# Patient Record
Sex: Female | Born: 1935 | Race: White | Hispanic: No | Smoking: Never smoker
Health system: Southern US, Community
[De-identification: ages and names within clinical notes are randomized; demographics above are authoritative.]

## PROBLEM LIST (undated history)

## (undated) DIAGNOSIS — E119 Type 2 diabetes mellitus without complications: Secondary | ICD-10-CM

## (undated) DIAGNOSIS — I350 Nonrheumatic aortic (valve) stenosis: Secondary | ICD-10-CM

## (undated) DIAGNOSIS — E785 Hyperlipidemia, unspecified: Secondary | ICD-10-CM

## (undated) DIAGNOSIS — I421 Obstructive hypertrophic cardiomyopathy: Secondary | ICD-10-CM

## (undated) DIAGNOSIS — K219 Gastro-esophageal reflux disease without esophagitis: Secondary | ICD-10-CM

## (undated) DIAGNOSIS — I10 Essential (primary) hypertension: Secondary | ICD-10-CM

## (undated) DIAGNOSIS — Z8619 Personal history of other infectious and parasitic diseases: Secondary | ICD-10-CM

## (undated) DIAGNOSIS — M2550 Pain in unspecified joint: Secondary | ICD-10-CM

## (undated) DIAGNOSIS — I2510 Atherosclerotic heart disease of native coronary artery without angina pectoris: Secondary | ICD-10-CM

## (undated) DIAGNOSIS — I119 Hypertensive heart disease without heart failure: Secondary | ICD-10-CM

## (undated) DIAGNOSIS — Z952 Presence of prosthetic heart valve: Secondary | ICD-10-CM

## (undated) DIAGNOSIS — D649 Anemia, unspecified: Secondary | ICD-10-CM

## (undated) DIAGNOSIS — Z8719 Personal history of other diseases of the digestive system: Secondary | ICD-10-CM

## (undated) DIAGNOSIS — M1990 Unspecified osteoarthritis, unspecified site: Secondary | ICD-10-CM

## (undated) DIAGNOSIS — R351 Nocturia: Secondary | ICD-10-CM

## (undated) DIAGNOSIS — Z8709 Personal history of other diseases of the respiratory system: Secondary | ICD-10-CM

## (undated) HISTORY — PX: ANKLE FRACTURE SURGERY: SHX122

## (undated) HISTORY — PX: CHOLECYSTECTOMY: SHX55

## (undated) HISTORY — DX: Nonrheumatic aortic (valve) stenosis: I35.0

## (undated) HISTORY — DX: Atherosclerotic heart disease of native coronary artery without angina pectoris: I25.10

## (undated) HISTORY — PX: DILATION AND CURETTAGE OF UTERUS: SHX78

## (undated) HISTORY — PX: OTHER SURGICAL HISTORY: SHX169

## (undated) HISTORY — DX: Obstructive hypertrophic cardiomyopathy: I42.1

## (undated) HISTORY — DX: Hypertensive heart disease without heart failure: I11.9

## (undated) HISTORY — DX: Gastro-esophageal reflux disease without esophagitis: K21.9

## (undated) HISTORY — PX: COLONOSCOPY: SHX174

## (undated) HISTORY — PX: WRIST FRACTURE SURGERY: SHX121

---

## 1997-11-07 ENCOUNTER — Ambulatory Visit (HOSPITAL_COMMUNITY): Admission: RE | Admit: 1997-11-07 | Discharge: 1997-11-07 | Payer: Self-pay | Admitting: Family Medicine

## 1998-11-25 ENCOUNTER — Other Ambulatory Visit: Admission: RE | Admit: 1998-11-25 | Discharge: 1998-11-25 | Payer: Self-pay | Admitting: Family Medicine

## 1998-12-11 ENCOUNTER — Encounter: Payer: Self-pay | Admitting: Family Medicine

## 1998-12-11 ENCOUNTER — Ambulatory Visit (HOSPITAL_COMMUNITY): Admission: RE | Admit: 1998-12-11 | Discharge: 1998-12-11 | Payer: Self-pay | Admitting: Family Medicine

## 1999-12-08 ENCOUNTER — Other Ambulatory Visit: Admission: RE | Admit: 1999-12-08 | Discharge: 1999-12-08 | Payer: Self-pay | Admitting: Family Medicine

## 1999-12-14 ENCOUNTER — Encounter: Payer: Self-pay | Admitting: Family Medicine

## 1999-12-14 ENCOUNTER — Encounter: Admission: RE | Admit: 1999-12-14 | Discharge: 1999-12-14 | Payer: Self-pay | Admitting: Family Medicine

## 2000-10-09 ENCOUNTER — Encounter: Payer: Self-pay | Admitting: Obstetrics and Gynecology

## 2000-10-09 ENCOUNTER — Encounter: Admission: RE | Admit: 2000-10-09 | Discharge: 2000-10-09 | Payer: Self-pay | Admitting: Obstetrics and Gynecology

## 2000-10-11 ENCOUNTER — Encounter: Admission: RE | Admit: 2000-10-11 | Discharge: 2000-10-11 | Payer: Self-pay | Admitting: Obstetrics and Gynecology

## 2000-10-11 ENCOUNTER — Encounter: Payer: Self-pay | Admitting: Obstetrics and Gynecology

## 2000-11-10 ENCOUNTER — Ambulatory Visit (HOSPITAL_BASED_OUTPATIENT_CLINIC_OR_DEPARTMENT_OTHER): Admission: RE | Admit: 2000-11-10 | Discharge: 2000-11-10 | Payer: Self-pay | Admitting: Orthopedic Surgery

## 2000-11-27 ENCOUNTER — Ambulatory Visit (HOSPITAL_COMMUNITY): Admission: RE | Admit: 2000-11-27 | Discharge: 2000-11-27 | Payer: Self-pay | Admitting: Obstetrics and Gynecology

## 2000-11-27 ENCOUNTER — Encounter (INDEPENDENT_AMBULATORY_CARE_PROVIDER_SITE_OTHER): Payer: Self-pay | Admitting: Specialist

## 2000-12-19 ENCOUNTER — Encounter: Admission: RE | Admit: 2000-12-19 | Discharge: 2000-12-19 | Payer: Self-pay | Admitting: Family Medicine

## 2000-12-19 ENCOUNTER — Encounter: Payer: Self-pay | Admitting: Family Medicine

## 2001-11-28 ENCOUNTER — Other Ambulatory Visit: Admission: RE | Admit: 2001-11-28 | Discharge: 2001-11-28 | Payer: Self-pay | Admitting: Obstetrics and Gynecology

## 2002-01-15 ENCOUNTER — Encounter: Payer: Self-pay | Admitting: Family Medicine

## 2002-01-15 ENCOUNTER — Encounter: Admission: RE | Admit: 2002-01-15 | Discharge: 2002-01-15 | Payer: Self-pay | Admitting: Family Medicine

## 2003-02-06 ENCOUNTER — Encounter: Admission: RE | Admit: 2003-02-06 | Discharge: 2003-02-06 | Payer: Self-pay | Admitting: Family Medicine

## 2003-02-06 ENCOUNTER — Encounter: Payer: Self-pay | Admitting: Family Medicine

## 2003-11-05 ENCOUNTER — Encounter: Payer: Self-pay | Admitting: Cardiology

## 2003-11-05 ENCOUNTER — Ambulatory Visit (HOSPITAL_COMMUNITY): Admission: RE | Admit: 2003-11-05 | Discharge: 2003-11-05 | Payer: Self-pay | Admitting: Family Medicine

## 2004-03-26 ENCOUNTER — Encounter: Admission: RE | Admit: 2004-03-26 | Discharge: 2004-03-26 | Payer: Self-pay | Admitting: Family Medicine

## 2004-05-10 ENCOUNTER — Other Ambulatory Visit: Admission: RE | Admit: 2004-05-10 | Discharge: 2004-05-10 | Payer: Self-pay | Admitting: Family Medicine

## 2004-05-18 ENCOUNTER — Encounter: Admission: RE | Admit: 2004-05-18 | Discharge: 2004-05-18 | Payer: Self-pay | Admitting: Family Medicine

## 2005-06-08 ENCOUNTER — Encounter: Admission: RE | Admit: 2005-06-08 | Discharge: 2005-06-08 | Payer: Self-pay | Admitting: Family Medicine

## 2006-11-24 ENCOUNTER — Encounter: Admission: RE | Admit: 2006-11-24 | Discharge: 2006-11-24 | Payer: Self-pay | Admitting: Cardiology

## 2010-09-10 NOTE — H&P (Signed)
[facility]  Patient:    Lori Gilbert, Lori Gilbert                        MRN: [id number] Adm. Date:  11/27/00 Attending:  Beather Arbour. Thomasena Edis, M.D. CC:         Maricela Bo, M.D., [facility], [place]   History and Physical  HISTORY OF PRESENT ILLNESS:   The patient is a 77 year old gravida 3, para 2, Caucasian female who experienced postmenopausal bleeding in March, referred by Dr. Maricela Bo.  She discontinued her Prempro on August 06, 2000 and stopped bleeding immediately; she did, however, have postmenopausal clotting last August.  She had been taking Prempro 0.625/5 mg.  She was referred for evaluation of this postmenopausal bleeding.  Patient did see ______, another gynecologist, in 1999 and had an endometrial biopsy due to postmenopausal bleeding at that time.  In August of 2001, the patient had an ultrasound at [facility] which reveals an ovarian cyst but was otherwise normal.  Patient did undergo a sonohysterogram for her postmenopausal bleeding.  She was found to have a large fundal polyp at the time of sonohysterogram and is thus admitted for a D&C/hysteroscopy to remove the polyp and to obtain endometrial tissue due to postmenopausal bleeding.  PAST OBSTETRICAL/GYNECOLOGICAL HISTORY:  Menarche at age 77.  No history of STD or PID.  No abnormal Pap.  Patient became menopausal at age 63.  ALLERGIES:                    No known drug allergies.  CURRENT MEDICATIONS:          1. Cozaar 100 mg daily.                               2. Pravachol 40 mg daily.                               3. Prilosec p.r.n.                               4. Aspirin one daily.                               5. Calcium 1800 mg.  PAST MEDICAL HISTORY:         1. Hypertension.                               2. Gastroesophageal reflux disease.                               3. Diabetes mellitus, diet controlled.  PAST SURGICAL HISTORY:        1. Removal of a cataract.           2. Fracture of the left ankle in 1994.  FAMILY HISTORY:               There is no family history of colon, breast, ovarian or prostate cancer.  The patients mother died at 95 of old age.  Her father died at 18 of Hodgkins disease.  She has one sister, age 93, with  hypertension.  Two additional sisters, ages 51 and 73, are alive and well. One brother is 47 with hypertension.  Two brothers are ages 51 and 65, alive and well.  She has two children, a daughter, age 19, and a son, age 43, both alive and well.  SOCIAL HISTORY:               The patient is a retired Probation officer.  She denies smoking or alcohol use.  REVIEW OF SYSTEMS:            Noncontributory.  Patient denies headaches, visual changes, chest pain, shortness of breath, abdominal pain, change in bowel habits, focal neurological symptoms, unintentional weight loss, dysuria, urgency, frequency, vaginal pruritus or discharge or any pain or bleeding with intercourse.  PHYSICAL EXAMINATION:  GENERAL APPEARANCE:           Patient is a well-developed Caucasian female.  VITAL SIGNS:                  Blood pressure 180/106.  Heart rate 76.  Weight 194 pounds.  HEENT:                        Normal.  NECK:                         Supple without thyromegaly or adenopathy.  LUNGS:                        Clear to auscultation.  CARDIAC:                      Regular rate and rhythm without murmurs, rubs, or gallops.  BREASTS:                      Symmetrical without masses, skin retraction or nipple discharge.  ABDOMEN:                      Soft, nontender, with normal bowel sounds.  No organomegaly or masses.  PELVIC:                       External genitalia normal.  No vulvar, vaginal or cervical lesions.  Pap smear had been previously performed by Dr. Purnell Shoemaker less than a year ago and was reportedly normal.  Bimanual examination reveals the uterus to be normal without any adnexal mass or tenderness.  EXTREMITIES:                   No cyanosis, clubbing or edema.  NEUROLOGIC:                   Oriented x 3.  IMPRESSION AND PLAN:          Patient is a 77 year old gravida 3, para 2 Caucasian female with postmenopausal bleeding and endometrial polyp found on sonohysterogram.  Patient is admitted for a dilatation and curettage and hysteroscopy to remove the endometrial polyp and to perform endometrial sampling to rule out endometrial hyperplasia or carcinoma.  Risks of surgery including anesthetic complication, hemorrhage or infection, damage to adjacent structures including bladder or bowel, blood vessels or ureters were discussed with the patient.  She was made aware of the risk of uterine perforation which could result in overwhelming life-threatening hemorrhage requiring emergent hysterectomy or uterine perforation which could result in bowel damage requiring  emergent colostomy or which could result in overwhelming life-threatening peritonitis.  She expresses understanding of and acceptance of these risks and desires to proceed. DD:  11/27/00 TD:  11/27/00 Job: [id number]

## 2010-09-10 NOTE — Op Note (Signed)
[facility]  Patient:    Lori Gilbert, Lori Gilbert                      MRN: [id number] Proc. Date: 11/27/00 Adm. Date:  [id number] Attending:  Madelyn Flavors CC:         Maricela Bo, M.D., [facility], [place]   Operative Report  PREOPERATIVE DIAGNOSES:       1. Postmenopausal bleeding.                               2. Endometrial polyp on sonohistogram.  POSTOPERATIVE DIAGNOSES:      1. Postmenopausal bleeding.                               2. Endometrial polyp on sonohistogram.  OPERATION:                    1. Dilatation and curettage.                               2. Hysteroscopy.                               3. Polypectomy.  SURGEON:                      Beather Arbour. Thomasena Edis, M.D., Ph.D.  FLUIDS:                       500 cc crystalloid during the procedure but 1000 cc prior to that time.  ESTIMATED BLOOD LOSS:         Minimal.  COMPLICATIONS:                None.  ANESTHESIA:                   General.  DRAINS:                       None.  DESCRIPTION OF PROCEDURE:     The patient entered the preop area.  We check blood sugar because of her history of diet-controlled diabetes.  Blood sugar was 321.  In addition, the patient gives a history today of a possible loss of consciousness event in July when she fell and broke her arm.  She thinks she just turned her ankle and does not remember this; however, she did have sedation and an axillary nerve block to repair her fractured arm just after this event without any complications.  We did receive EKG from the day surgery area at [facility], but it contained significant artifact; thus another EKG was performed this morning and was noted to be normal.  In addition, the patients blood pressure was elevated today.  We will let Dr. Purnell Shoemaker know of these findings.  The patient was brought to the operating room after it was deemed that she was fine for surgery and identified on the  operating table.  After induction of adequate general anesthesia, the patient was placed in the [place] stirrups and prepped and draped in the usual sterile fashion.  The bladder was straight catheterized for approximately 100 cc of clear, yellow urine.  Examination  under anesthesia revealed the uterus to be approximately 8 weeks, anteverted, without any adnexal mass palpated.  The speculum was placed, and the anterior lip of the cervix was infiltrated with 1 cc of 1% lidocaine and grasped with a single-tooth tenaculum.  The remaining 9 cc of 1% plain lidocaine were infused for a paracervical block.  The cervix was very gently dilated up to a #27 Pratt dilator using the [facility] hysteroscope.  The hysteroscope was placed, and a large fundal polyp was identified.  The tubal ostium on the right was identified.  Endometrium was otherwise thin.  The polyp was then removed in its entirety with Randall Stone forceps and also using the small serrated curet.  A very careful curettage was performed.  After removal of the polyp, there was a good cry heard all around.  The polyp and endometrial curettings were sent to pathology for examination.  There was noted to be no bleeding from the cervix.  At that point, the procedure was then terminated.  The patient tolerated the procedure well without apparent complications and was transferred to the recovery room in stable condition.  All instrument, sponge, and needle counts were correct. DD:  11/27/00 TD:  11/28/00 Job: [id number]

## 2010-09-10 NOTE — Op Note (Signed)
[place]  Patient:    BUENA, BOEHM                      MRN: [id number] Proc. Date: 11/10/00 Adm. Date:  [id number] Attending:  Ronne Binning                           Operative Report  PREOPERATIVE DIAGNOSES:  Distal radius fracture left wrist; scaphoid fracture, left scaphoid.  POSTOPERATIVE DIAGNOSES:  Distal radius fracture left wrist; scaphoid fracture, left scaphoid.  OPERATION:  Open reduction and internal fixation with DBI plate, volar left wrist; Acutrak screw fixation, left scaphoid.  SURGEON:  Nicki Reaper, M.D.  ASSISTANT:  Artist Pais. Mina Marble, M.D.  ANESTHESIA:  Axillary block.  ANESTHESIOLOGIST:  Janetta Hora. Gelene Mink, M.D.  HISTORY:  The patient is a 77 year old female who suffered a fall and a distal radius fracture which is displaced and has not been held with closed means.  DESCRIPTION OF PROCEDURE:  The patient was brought to the operating room where an axillary block was carried out without difficulty.  She was prepped and draped using Betadine scrubbing solution with the left arm free.  The limb was exsanguinated with an Esmarch bandage.  Tourniquet placed high on the arm was inflated to 250 mmHg.  A volar incision was made over the flexor carpi radialis, carried radially and then ulnarly at the wrist crease and carried down through subcutaneous tissue.  Bleeders were electrocauterized.  Interval between the flexor carpi radialis and radial nerve was identified.  The dissection was carried down to the pronator quadratus.  This was incised from its radial attachment.  Periosteal elevators were used to elevate the periosteum.  The fracture was immediately encountered.  This showed significant scarring and shortening.  This was freed both dorsally and palmarly.  The fracture was then debrided, manipulated and reduced.  A DBI volar left plate was then selected.  This was placed onto the distal radius, holding  the fracture in a reduced position.  A 12-mm screw was placed in the adjustable hole; this allowed positioning of the plate under image intensification.  This was tigthened.  A distal screw was then inserted; this measured 20 mm.  This was placed and holding the distal fragment in good position with good alignment in both AP and lateral directions.  The remaining three distal screws were inserted; these were placed as screws rather than pegs and measured 20 to 22 mm.  The proximal holes were then drilled and placed with 12- to 8-mm screws, firmly fixing the plate in position; this was confirmed on both AP lateral directions.  The dissection was then carried down to the distal pole of the scaphoid.  This was isolated.  At the scapho-trapezial joint, a 0.035 K-wire was then inserted, found to lie in good position but slightly volar, for placement of an Acutrak screw.  A second mini-Acutrak screw guidepin was then placed slightly more dorsally by approximately 2 to 3 mm.  This was found to lie in good position.  This was measured at 25 mm.  This was then tapped to 20 mm and a 20-mm screw was then inserted across the fracture site under image intensification.  Multiple views were taken to be certain that the screw had not entered into the joint; this was confirmed on x-rays.  There was good compression.  The rotation pin was removed along with the guidewire.  The wounds were copiously irrigated with a saline solution.  The pronator quadratus was then reapproximated to its normal position with figure-of-eight 4-0 Vicryl sutures, the subcutaneous tissue closed with interrupted 4-0 Vicryl and the skin with a subcuticular 4-0 Monocryl suture.  Steri-Strips were applied and sterile compressive dressing and dorsal palmar splint applied.  The patient tolerated the procedure well and was taken to the recovery room for observation in satisfactory condition.  Radial nerve was protected throughout the  procedure. On deflation of the tourniquet, all fingers immediately pinked.  Patient is discharged home to return to the [facility] in one week, on Percocet and Keflex. DD:  11/10/00 TD:  11/11/00 Job: [id number]

## 2011-12-10 ENCOUNTER — Other Ambulatory Visit: Payer: Self-pay | Admitting: Cardiology

## 2013-05-07 ENCOUNTER — Encounter (HOSPITAL_BASED_OUTPATIENT_CLINIC_OR_DEPARTMENT_OTHER): Payer: Self-pay | Admitting: *Deleted

## 2013-05-13 ENCOUNTER — Ambulatory Visit (HOSPITAL_BASED_OUTPATIENT_CLINIC_OR_DEPARTMENT_OTHER)
Admission: RE | Admit: 2013-05-13 | Discharge: 2013-05-13 | Disposition: A | Discharge status: Home / Self Care | Payer: Medicare Other | Source: Ambulatory Visit | Attending: Orthopedic Surgery | Admitting: Orthopedic Surgery

## 2013-05-13 ENCOUNTER — Encounter (HOSPITAL_BASED_OUTPATIENT_CLINIC_OR_DEPARTMENT_OTHER)
Admission: RE | Disposition: A | Discharge status: Home / Self Care | Payer: Self-pay | Source: Ambulatory Visit | Attending: Orthopedic Surgery

## 2013-05-13 ENCOUNTER — Encounter (HOSPITAL_BASED_OUTPATIENT_CLINIC_OR_DEPARTMENT_OTHER): Payer: Self-pay

## 2013-05-13 ENCOUNTER — Encounter (HOSPITAL_BASED_OUTPATIENT_CLINIC_OR_DEPARTMENT_OTHER): Payer: Medicare Other | Admitting: Anesthesiology

## 2013-05-13 ENCOUNTER — Ambulatory Visit (HOSPITAL_BASED_OUTPATIENT_CLINIC_OR_DEPARTMENT_OTHER): Payer: Medicare Other | Admitting: Anesthesiology

## 2013-05-13 ENCOUNTER — Other Ambulatory Visit: Payer: Self-pay | Admitting: Physician Assistant

## 2013-05-13 DIAGNOSIS — I10 Essential (primary) hypertension: Secondary | ICD-10-CM | POA: Insufficient documentation

## 2013-05-13 DIAGNOSIS — F329 Major depressive disorder, single episode, unspecified: Secondary | ICD-10-CM | POA: Insufficient documentation

## 2013-05-13 DIAGNOSIS — F3289 Other specified depressive episodes: Secondary | ICD-10-CM | POA: Insufficient documentation

## 2013-05-13 DIAGNOSIS — G5600 Carpal tunnel syndrome, unspecified upper limb: Secondary | ICD-10-CM | POA: Insufficient documentation

## 2013-05-13 DIAGNOSIS — Z794 Long term (current) use of insulin: Secondary | ICD-10-CM | POA: Insufficient documentation

## 2013-05-13 DIAGNOSIS — F411 Generalized anxiety disorder: Secondary | ICD-10-CM | POA: Insufficient documentation

## 2013-05-13 DIAGNOSIS — E119 Type 2 diabetes mellitus without complications: Secondary | ICD-10-CM | POA: Insufficient documentation

## 2013-05-13 HISTORY — PX: CARPAL TUNNEL RELEASE: SHX101

## 2013-05-13 HISTORY — DX: Personal history of other diseases of the digestive system: Z87.19

## 2013-05-13 HISTORY — DX: Type 2 diabetes mellitus without complications: E11.9

## 2013-05-13 HISTORY — DX: Essential (primary) hypertension: I10

## 2013-05-13 LAB — GLUCOSE, CAPILLARY: Glucose-Capillary: 109 mg/dL — ABNORMAL HIGH (ref 70–99)

## 2013-05-13 SURGERY — CARPAL TUNNEL RELEASE
Anesthesia: Regional | Site: Wrist | Laterality: Right

## 2013-05-13 MED ORDER — LACTATED RINGERS IV SOLN
INTRAVENOUS | Status: DC
  Administered 2013-05-13: 11:00:00 via INTRAVENOUS

## 2013-05-13 MED ORDER — BUPIVACAINE HCL (PF) 0.5 % IJ SOLN
INTRAMUSCULAR | Status: AC
  Filled 2013-05-13: qty 30

## 2013-05-13 MED ORDER — FENTANYL CITRATE 0.05 MG/ML IJ SOLN
INTRAMUSCULAR | Status: DC | PRN
  Administered 2013-05-13 (×2): 50 ug via INTRAVENOUS

## 2013-05-13 MED ORDER — ONDANSETRON HCL 4 MG/2ML IJ SOLN
INTRAMUSCULAR | Status: DC | PRN
  Administered 2013-05-13: 4 mg via INTRAVENOUS

## 2013-05-13 MED ORDER — PROPOFOL 10 MG/ML IV BOLUS
INTRAVENOUS | Status: DC | PRN
  Administered 2013-05-13: 10 mg via INTRAVENOUS
  Administered 2013-05-13: 20 mg via INTRAVENOUS

## 2013-05-13 MED ORDER — CEFAZOLIN SODIUM-DEXTROSE 2-3 GM-% IV SOLR
INTRAVENOUS | Status: AC
  Filled 2013-05-13: qty 50

## 2013-05-13 MED ORDER — BUPIVACAINE HCL (PF) 0.5 % IJ SOLN
INTRAMUSCULAR | Status: DC | PRN
  Administered 2013-05-13: 10 mL

## 2013-05-13 MED ORDER — SODIUM CHLORIDE 0.9 % IV SOLN: INTRAVENOUS | Status: DC

## 2013-05-13 MED ORDER — CEFAZOLIN SODIUM-DEXTROSE 2-3 GM-% IV SOLR
2.0000 g | INTRAVENOUS | Status: AC
  Administered 2013-05-13: 2 g via INTRAVENOUS

## 2013-05-13 MED ORDER — FENTANYL CITRATE 0.05 MG/ML IJ SOLN
INTRAMUSCULAR | Status: AC
  Filled 2013-05-13: qty 4

## 2013-05-13 MED ORDER — LIDOCAINE HCL (CARDIAC) 20 MG/ML IV SOLN
INTRAVENOUS | Status: DC | PRN
  Administered 2013-05-13: 50 mg via INTRAVENOUS

## 2013-05-13 MED ORDER — FENTANYL CITRATE 0.05 MG/ML IJ SOLN: 25.0000 ug | INTRAMUSCULAR | Status: DC | PRN

## 2013-05-13 MED ORDER — MIDAZOLAM HCL 2 MG/2ML IJ SOLN: 1.0000 mg | INTRAMUSCULAR | Status: DC | PRN

## 2013-05-13 MED ORDER — FENTANYL CITRATE 0.05 MG/ML IJ SOLN: 50.0000 ug | INTRAMUSCULAR | Status: DC | PRN

## 2013-05-13 MED ORDER — DEXAMETHASONE SODIUM PHOSPHATE 10 MG/ML IJ SOLN
INTRAMUSCULAR | Status: DC | PRN
  Administered 2013-05-13: 5 mg via INTRAVENOUS

## 2013-05-13 MED ORDER — LIDOCAINE HCL (PF) 1 % IJ SOLN
INTRAMUSCULAR | Status: AC
  Filled 2013-05-13: qty 5

## 2013-05-13 MED ORDER — CHLORHEXIDINE GLUCONATE 4 % EX LIQD: 60.0000 mL | Freq: Once | CUTANEOUS | Status: DC

## 2013-05-13 MED ORDER — ONDANSETRON HCL 4 MG/2ML IJ SOLN: 4.0000 mg | Freq: Once | INTRAMUSCULAR | Status: DC | PRN

## 2013-05-13 SURGICAL SUPPLY — 41 items
BANDAGE ELASTIC 3 VELCRO ST LF (GAUZE/BANDAGES/DRESSINGS) ×2 IMPLANT
BLADE MINI RND TIP GREEN BEAV (BLADE) IMPLANT
BLADE SURG 15 STRL LF DISP TIS (BLADE) ×1 IMPLANT
BLADE SURG 15 STRL SS (BLADE) ×1
BNDG CONFORM 3 STRL LF (GAUZE/BANDAGES/DRESSINGS) ×2 IMPLANT
BNDG ESMARK 4X9 LF (GAUZE/BANDAGES/DRESSINGS) ×2 IMPLANT
CORDS BIPOLAR (ELECTRODE) ×2 IMPLANT
COVER MAYO STAND STRL (DRAPES) ×2 IMPLANT
COVER TABLE BACK 60X90 (DRAPES) ×2 IMPLANT
DRAPE EXTREMITY T 121X128X90 (DRAPE) ×2 IMPLANT
DRSG EMULSION OIL 3X3 NADH (GAUZE/BANDAGES/DRESSINGS) ×2 IMPLANT
DURAPREP 26ML APPLICATOR (WOUND CARE) ×2 IMPLANT
GLOVE BIOGEL PI IND STRL 7.0 (GLOVE) ×1 IMPLANT
GLOVE BIOGEL PI IND STRL 8 (GLOVE) ×1 IMPLANT
GLOVE BIOGEL PI INDICATOR 7.0 (GLOVE) ×1
GLOVE BIOGEL PI INDICATOR 8 (GLOVE) ×1
GLOVE ECLIPSE 6.5 STRL STRAW (GLOVE) ×2 IMPLANT
GLOVE SURG ORTHO 8.0 STRL STRW (GLOVE) ×2 IMPLANT
GOWN STRL REUS W/ TWL LRG LVL3 (GOWN DISPOSABLE) ×1 IMPLANT
GOWN STRL REUS W/ TWL XL LVL3 (GOWN DISPOSABLE) ×1 IMPLANT
GOWN STRL REUS W/TWL LRG LVL3 (GOWN DISPOSABLE) ×1
GOWN STRL REUS W/TWL XL LVL3 (GOWN DISPOSABLE) ×1
NEEDLE HYPO 25X1 1.5 SAFETY (NEEDLE) ×2 IMPLANT
NS IRRIG 1000ML POUR BTL (IV SOLUTION) ×2 IMPLANT
PACK BASIN DAY SURGERY FS (CUSTOM PROCEDURE TRAY) ×2 IMPLANT
PAD ABD 8X10 STRL (GAUZE/BANDAGES/DRESSINGS) ×2 IMPLANT
PADDING CAST ABS 3INX4YD NS (CAST SUPPLIES) ×1
PADDING CAST ABS 4INX4YD NS (CAST SUPPLIES)
PADDING CAST ABS COTTON 3X4 (CAST SUPPLIES) ×1 IMPLANT
PADDING CAST ABS COTTON 4X4 ST (CAST SUPPLIES) IMPLANT
SPLINT PLASTER CAST XFAST 3X15 (CAST SUPPLIES) ×1 IMPLANT
SPLINT PLASTER XTRA FASTSET 3X (CAST SUPPLIES) ×1
SPONGE GAUZE 4X4 12PLY (GAUZE/BANDAGES/DRESSINGS) ×2 IMPLANT
STOCKINETTE 4X48 STRL (DRAPES) ×2 IMPLANT
SUT ETHILON 4 0 PS 2 18 (SUTURE) ×2 IMPLANT
SUT ETHILON 5 0 P 3 18 (SUTURE)
SUT NYLON ETHILON 5-0 P-3 1X18 (SUTURE) IMPLANT
SYR BULB 3OZ (MISCELLANEOUS) ×2 IMPLANT
SYR CONTROL 10ML LL (SYRINGE) IMPLANT
TOWEL OR 17X24 6PK STRL BLUE (TOWEL DISPOSABLE) ×2 IMPLANT
UNDERPAD 30X30 INCONTINENT (UNDERPADS AND DIAPERS) ×2 IMPLANT

## 2013-05-13 NOTE — Brief Op Note (Signed)
05/13/2013  12:45 PM  PATIENT:  Lori Gilbert  80 y.o. female  PRE-OPERATIVE DIAGNOSIS:  RIGHT CARPAL TUNNEL SYNDROME   POST-OPERATIVE DIAGNOSIS:  RIGHT CARPAL TUNNEL SYNDROME   PROCEDURE:  Procedure(s): RIGHT CARPAL TUNNEL RELEASE (Right)  SURGEON:  Surgeon(s) and Role:    * W D Carloyn Manner., MD - Primary  PHYSICIAN ASSISTANT:   ASSISTANTS: none   ANESTHESIA:   general, local  EBL:  Total I/O In: 700 [I.V.:700] Out: -   BLOOD ADMINISTERED:none  DRAINS: none   LOCAL MEDICATIONS USED:  MARCAINE     SPECIMEN:  No Specimen  DISPOSITION OF SPECIMEN:  N/A  COUNTS:  YES  TOURNIQUET:   Total Tourniquet Time Documented: Forearm (Right) - 23 minutes Total: Forearm (Right) - 23 minutes   DICTATION: .Other Dictation: Dictation Number unknown  PLAN OF CARE: Discharge to home after PACU  PATIENT DISPOSITION:  PACU - hemodynamically stable.   Delay start of Pharmacological VTE agent (>24hrs) due to surgical blood loss or risk of bleeding: not applicable

## 2013-05-13 NOTE — Discharge Instructions (Signed)
Diet: As you were doing prior to hospitalization  Activity:  Increase activity slowly as tolerated                  No lifting or driving while on pain med   Shower:  May shower without a dressing starting Thursday, NO SOAKING IN TUB, then cover with clean dry dressing.  Dressing:  You may change your dressing on Thursday                    Then change the dressing daily with sterile 4"x4"s gauze dressing                     Weight Bearing:  No lifting with operative hand, but continue to work on range of motion of fingers to help reduce swelling.  To prevent constipation: you may use a stool softener such as -               Colace ( over the counter) 100 mg by mouth twice a day                Drink plenty of fluids ( prune juice may be helpful) and high fiber foods                Miralax ( over the counter) for constipation as needed.    Precautions:  If you experience chest pain or shortness of breath - call 911 immediately               For transfer to the hospital emergency department!!               If you develop a fever greater that 101 F, purulent drainage from wound,                             increased redness or drainage from wound, or calf pain -- Call the office   Follow- Up Appointment:  Please call for an appointment to be seen in 2 weeks               [place] - [phone]    Post Anesthesia Home Care Instructions  Activity: Get plenty of rest for the remainder of the day. A responsible adult should stay with you for 24 hours following the procedure.  For the next 24 hours, DO NOT: -Drive a car -Advertising copywriter -Drink alcoholic beverages -Take any medication unless instructed by your physician -Make any legal decisions or sign important papers.  Meals: Start with liquid foods such as gelatin or soup. Progress to regular foods as tolerated. Avoid greasy, spicy, heavy foods. If nausea and/or vomiting occur, drink only clear liquids until the nausea and/or  vomiting subsides. Call your physician if vomiting continues.  Special Instructions/Symptoms: Your throat may feel dry or sore from the anesthesia or the breathing tube placed in your throat during surgery. If this causes discomfort, gargle with warm salt water. The discomfort should disappear within 24 hours.

## 2013-05-13 NOTE — Anesthesia Postprocedure Evaluation (Signed)
  Anesthesia Post-op Note  Patient: Lori Gilbert  Procedure(s) Performed: Procedure(s): RIGHT CARPAL TUNNEL RELEASE (Right)  Patient Location: PACU  Anesthesia Type:MAC and Bier block  Level of Consciousness: awake, alert  and oriented  Airway and Oxygen Therapy: Patient Spontanous Breathing  Post-op Pain: mild  Post-op Assessment: Post-op Vital signs reviewed  Post-op Vital Signs: Reviewed  Complications: No apparent anesthesia complications

## 2013-05-13 NOTE — Interval H&P Note (Signed)
History and Physical Interval Note:  05/13/2013 12:01 PM  Lori Gilbert  has presented today for surgery, with the diagnosis of RIGHT CARPAL TUNNEL SYNDROME   The various methods of treatment have been discussed with the patient and family. After consideration of risks, benefits and other options for treatment, the patient has consented to  Procedure(s): RIGHT CARPAL TUNNEL RELEASE (Right) as a surgical intervention .  The patient's history has been reviewed, patient examined, no change in status, stable for surgery.  I have reviewed the patient's chart and labs.  Questions were answered to the patient's satisfaction.     Aydon Swamy JR,W D

## 2013-05-13 NOTE — H&P (View-Only) (Signed)
Lori Gilbert is an 80 y.o. female.   Chief Complaint: right carpal tunnel syndrom  HPI: Patient has been seen in our outpatient clinic nerve conduction studies showing right median nerve compression consistent with carpal tunnel syndrome.  Likely exacerbated by distal radius fracture she complains of profound numbness and pain in her hand.    Past Medical History  Diagnosis Date  . Diabetes mellitus without complication   . Hypertension   . H/O hiatal hernia   . Anxiety   . Depression     Past Surgical History  Procedure Laterality Date  . Cholecystectomy    . Dilation and curettage of uterus    . Ankle fracture surgery Bilateral   . Wrist fracture surgery Left     No family history on file. Social History:  has no tobacco, alcohol, and drug history on file.  Allergies:  Allergies  Allergen Reactions  . Codeine     hyper     (Not in a hospital admission)  No results found for this or any previous visit (from the past 48 hour(s)). No results found.  Review of Systems  Constitutional: Negative.   HENT: Negative.   Eyes: Negative.   Respiratory: Negative.   Cardiovascular: Negative.   Gastrointestinal: Negative.   Genitourinary: Positive for dysuria. Negative for urgency, frequency, hematuria and flank pain.  Musculoskeletal:       Right hand pain and numbness  Skin: Negative.   Neurological: Negative.   Endo/Heme/Allergies: Bruises/bleeds easily.  Psychiatric/Behavioral: Positive for depression. The patient is nervous/anxious.     There were no vitals taken for this visit. Physical Exam  Constitutional: She is oriented to person, place, and time. She appears well-developed and well-nourished. No distress.  HENT:  Head: Normocephalic and atraumatic.  Nose: Nose normal.  Eyes: Conjunctivae and EOM are normal.  Neck: Normal range of motion. Neck supple.  Cardiovascular: Normal rate, regular rhythm and intact distal pulses.   Respiratory: Effort normal. No  respiratory distress.  GI: Soft. She exhibits no distension. There is no tenderness.  Musculoskeletal:       Right wrist: She exhibits decreased range of motion, tenderness and bony tenderness.  Right hand slightly diminished grip strength, positive Tinel, Positive Phalens   Lymphadenopathy:    She has no cervical adenopathy.  Neurological: She is alert and oriented to person, place, and time. No cranial nerve deficit.  Skin: Skin is warm and dry.  Psychiatric: She has a normal mood and affect. Her behavior is normal.     Assessment/Plan Right carpal tunnel syndrome  Risks and benefits of right carpal tunnel release discussed with the patient and she wishes to proceed.  This will be done on an outpatient basis d/c home following surgery.    Lori Gilbert 05/13/2013, 10:11 AM    

## 2013-05-13 NOTE — Anesthesia Preprocedure Evaluation (Addendum)
Anesthesia Evaluation  Patient identified by MRN, date of birth, ID band Patient awake    Reviewed: Allergy & Precautions, H&P , NPO status , Patient's Chart, lab work & pertinent test results  Airway Mallampati: I TM Distance: >3 FB Neck ROM: Full    Dental  (+) Teeth Intact and Dental Advisory Given   Pulmonary  breath sounds clear to auscultation        Cardiovascular hypertension, + Valvular Problems/Murmurs AS Rhythm:Regular Rate:Normal     Neuro/Psych    GI/Hepatic   Endo/Other  diabetes, Well Controlled, Type 2, Insulin Dependent and Oral Hypoglycemic Agents  Renal/GU      Musculoskeletal   Abdominal   Peds  Hematology   Anesthesia Other Findings   Reproductive/Obstetrics                          Anesthesia Physical Anesthesia Plan  ASA: III  Anesthesia Plan: Bier Block   Post-op Pain Management:    Induction: Intravenous  Airway Management Planned: Simple Face Mask  Additional Equipment:   Intra-op Plan:   Post-operative Plan:   Informed Consent: I have reviewed the patients History and Physical, chart, labs and discussed the procedure including the risks, benefits and alternatives for the proposed anesthesia with the patient or authorized representative who has indicated his/her understanding and acceptance.   Dental advisory given  Plan Discussed with: CRNA, Anesthesiologist and Surgeon  Anesthesia Plan Comments:         Anesthesia Quick Evaluation

## 2013-05-13 NOTE — Transfer of Care (Signed)
Immediate Anesthesia Transfer of Care Note  Patient: Lori Gilbert  Procedure(s) Performed: Procedure(s): RIGHT CARPAL TUNNEL RELEASE (Right)  Patient Location: PACU  Anesthesia Type:MAC and Bier block  Level of Consciousness: awake and alert   Airway & Oxygen Therapy: Patient Spontanous Breathing and Patient connected to face mask oxygen  Post-op Assessment: Report given to PACU RN and Post -op Vital signs reviewed and stable  Post vital signs: Reviewed and stable  Complications: No apparent anesthesia complications

## 2013-05-13 NOTE — H&P (Signed)
Lori Gilbert is an 80 y.o. female.   Chief Complaint: right carpal tunnel syndrom  HPI: Patient has been seen in our outpatient clinic nerve conduction studies showing right median nerve compression consistent with carpal tunnel syndrome.  Likely exacerbated by distal radius fracture she complains of profound numbness and pain in her hand.    Past Medical History  Diagnosis Date  . Diabetes mellitus without complication   . Hypertension   . H/O hiatal hernia   . Anxiety   . Depression     Past Surgical History  Procedure Laterality Date  . Cholecystectomy    . Dilation and curettage of uterus    . Ankle fracture surgery Bilateral   . Wrist fracture surgery Left     No family history on file. Social History:  has no tobacco, alcohol, and drug history on file.  Allergies:  Allergies  Allergen Reactions  . Codeine     hyper     (Not in a hospital admission)  No results found for this or any previous visit (from the past 48 hour(s)). No results found.  Review of Systems  Constitutional: Negative.   HENT: Negative.   Eyes: Negative.   Respiratory: Negative.   Cardiovascular: Negative.   Gastrointestinal: Negative.   Genitourinary: Positive for dysuria. Negative for urgency, frequency, hematuria and flank pain.  Musculoskeletal:       Right hand pain and numbness  Skin: Negative.   Neurological: Negative.   Endo/Heme/Allergies: Bruises/bleeds easily.  Psychiatric/Behavioral: Positive for depression. The patient is nervous/anxious.     There were no vitals taken for this visit. Physical Exam  Constitutional: She is oriented to person, place, and time. She appears well-developed and well-nourished. No distress.  HENT:  Head: Normocephalic and atraumatic.  Nose: Nose normal.  Eyes: Conjunctivae and EOM are normal.  Neck: Normal range of motion. Neck supple.  Cardiovascular: Normal rate, regular rhythm and intact distal pulses.   Respiratory: Effort normal. No  respiratory distress.  GI: Soft. She exhibits no distension. There is no tenderness.  Musculoskeletal:       Right wrist: She exhibits decreased range of motion, tenderness and bony tenderness.  Right hand slightly diminished grip strength, positive Tinel, Positive Phalens   Lymphadenopathy:    She has no cervical adenopathy.  Neurological: She is alert and oriented to person, place, and time. No cranial nerve deficit.  Skin: Skin is warm and dry.  Psychiatric: She has a normal mood and affect. Her behavior is normal.     Assessment/Plan Right carpal tunnel syndrome  Risks and benefits of right carpal tunnel release discussed with the patient and she wishes to proceed.  This will be done on an outpatient basis d/c home following surgery.    Margart Sickles 05/13/2013, 10:11 AM

## 2013-05-13 NOTE — Anesthesia Procedure Notes (Addendum)
Anesthesia Regional Block:  Bier block (IV Regional)  Pre-Anesthetic Checklist: ,, timeout performed, Correct Patient, Correct Site, Correct Laterality, Correct Procedure,, site marked, surgical consent,, at surgeon's request Needles:  Injection technique: Single-shot  Needle Type: Other      Needle Gauge: 20 and 20 G    Additional Needles: Bier block (IV Regional) Narrative:  Start time: 05/13/2013 12:13 PM End time: 05/13/2013 12:14 PM Injection made incrementally with aspirations every 35 mL.  Performed by: Personally   Additional Notes: Esmark wrap, torq up, injected 0.5% pres free lidocaine 35 ml.

## 2013-05-14 ENCOUNTER — Encounter (HOSPITAL_BASED_OUTPATIENT_CLINIC_OR_DEPARTMENT_OTHER): Payer: Self-pay | Admitting: Orthopedic Surgery

## 2013-05-14 NOTE — Op Note (Signed)
NAMEJASMEEN, Lori Gilbert NO.:  [id number]  MEDICAL RECORD NO.:  [id number]  LOCATION:                               FACILITY:  [facility]  PHYSICIAN:  Dyke Brackett, M.D.    DATE OF BIRTH:  1932/06/02  DATE OF PROCEDURE:  05/13/2013 DATE OF DISCHARGE:  05/13/2013                              OPERATIVE REPORT   INDICATION:  Right carpal tunnel with intractable pain proven on nerve conduction, requiring narcotics, thought to be amenable to outpatient surgery.  PREOPERATIVE DIAGNOSIS:  Right carpal tunnel syndrome.  POSTOPERATIVE DIAGNOSIS:  Right carpal tunnel syndrome.  OPERATION:  Right carpal tunnel release.  SURGEON:  Dyke Brackett, M.D.  IV Xylocaine with local supplementation.  Tourniquet time actually was 20 minutes.  DESCRIPTION OF PROCEDURE:  After __________ Xylocaine anesthesia, curvilinear incision was made to just ulnar thenar crease.  We carried out dissection distally to the level of superficial arch proximally to include the antebrachial fascia of the wrist to the transverse carpal ligament on the ulnar side of the ligament.  The nerve showed moderately severe compression, hourglass configuration.  There were no other anomalies noted within the canal.  Wound was irrigated and closed with interrupted 4-0 nylon, infiltrated with 10 mL, 0.5% Marcaine plain into the subcutaneous tissues around the incision, lightly compressive sterile bulky dressing was applied.  Taken to recovery room in stable condition.     Dyke Brackett, M.D.     WDC/MEDQ  D:  05/13/2013  T:  05/13/2013  Job:  [id number]

## 2013-05-15 LAB — POCT I-STAT, CHEM 8
BUN: 22 mg/dL (ref 6–23)
CALCIUM ION: 1.19 mmol/L (ref 1.13–1.30)
CHLORIDE: 105 meq/L (ref 96–112)
Creatinine, Ser: 0.9 mg/dL (ref 0.50–1.10)
GLUCOSE: 138 mg/dL — AB (ref 70–99)
HCT: 36 % (ref 36.0–46.0)
Hemoglobin: 12.2 g/dL (ref 12.0–15.0)
Potassium: 4.1 mEq/L (ref 3.7–5.3)
Sodium: 141 mEq/L (ref 137–147)
TCO2: 24 mmol/L (ref 0–100)

## 2014-02-25 ENCOUNTER — Other Ambulatory Visit: Payer: Self-pay | Admitting: Orthopedic Surgery

## 2014-02-25 DIAGNOSIS — M25562 Pain in left knee: Secondary | ICD-10-CM

## 2014-03-06 ENCOUNTER — Ambulatory Visit
Admission: RE | Admit: 2014-03-06 | Discharge: 2014-03-06 | Disposition: A | Discharge status: Home / Self Care | Payer: Medicare Other | Source: Ambulatory Visit | Attending: Orthopedic Surgery | Admitting: Orthopedic Surgery

## 2014-03-06 DIAGNOSIS — M25562 Pain in left knee: Secondary | ICD-10-CM

## 2014-03-25 ENCOUNTER — Encounter: Payer: Self-pay | Admitting: Cardiology

## 2014-03-25 DIAGNOSIS — I350 Nonrheumatic aortic (valve) stenosis: Secondary | ICD-10-CM

## 2014-03-25 DIAGNOSIS — I119 Hypertensive heart disease without heart failure: Secondary | ICD-10-CM | POA: Insufficient documentation

## 2014-03-25 DIAGNOSIS — R0600 Dyspnea, unspecified: Secondary | ICD-10-CM

## 2014-03-25 DIAGNOSIS — E669 Obesity, unspecified: Secondary | ICD-10-CM | POA: Insufficient documentation

## 2014-03-25 DIAGNOSIS — E785 Hyperlipidemia, unspecified: Secondary | ICD-10-CM | POA: Insufficient documentation

## 2014-03-25 DIAGNOSIS — K219 Gastro-esophageal reflux disease without esophagitis: Secondary | ICD-10-CM

## 2014-03-25 DIAGNOSIS — I421 Obstructive hypertrophic cardiomyopathy: Secondary | ICD-10-CM

## 2014-03-25 DIAGNOSIS — E119 Type 2 diabetes mellitus without complications: Secondary | ICD-10-CM

## 2014-03-25 HISTORY — DX: Type 2 diabetes mellitus without complications: E11.9

## 2014-03-25 HISTORY — DX: Obstructive hypertrophic cardiomyopathy: I42.1

## 2014-03-25 HISTORY — DX: Obesity, unspecified: E66.9

## 2014-03-25 HISTORY — DX: Hypertensive heart disease without heart failure: I11.9

## 2014-03-25 HISTORY — DX: Dyspnea, unspecified: R06.00

## 2014-03-25 HISTORY — DX: Gastro-esophageal reflux disease without esophagitis: K21.9

## 2014-03-25 NOTE — Progress Notes (Signed)
Patient ID: Lori Gilbert, female   DOB: [birth year], 80 y.o.   MRN: [id number]  Lori, Gilbert  Date of visit:  03/25/2014 DOB:  1932/07/25    Age:  80 yrs. Medical record number:  [id number]     Account number:  [id number] Primary Care Provider: Beverlyn Roux ____________________________ CURRENT DIAGNOSES  1. Nonrheumatic aortic (valve) stenosis  2. Hypertrophic obstructive cardiomyopathy  3. Hyperlipidemia, unspecified  4. Hypertensive heart disease without heart failure  5. Iron deficiency anemia, unspecified  6. Type 2 diabetes mellitus without complications  7. Gastro-esophageal reflux disease without esophagitis ____________________________ ALLERGIES  Bextra, Intolerance-unknown  Codeine, Intolerance-unknown  Monopril, Intolerance-unknown ____________________________ MEDICATIONS  1. omeprazole 20 mg tablet,delayed release (DR/EC), BID  2. sertraline 100 mg tablet, 1 p.o. daily  3. glimepiride 1 mg tablet, 1 p.o. daily  4. furosemide 20 mg tablet, 1 p.o. daily  5. trazodone 50 mg tablet, QHS  6. diltiazem ER 240 mg capsule,extended release, 1 p.o. daily  7. pravastatin 40 mg tablet, 1 p.o. daily  8. losartan 100 mg tablet, 1 p.o. daily  9. potassium chloride ER 10 mEq tablet,extended release, 1 p.o. daily  10. metformin 500 mg tablet, BID ____________________________ CHIEF COMPLAINTS  Followup of Nonrheumatic aortic (valve) stenosis ____________________________ HISTORY OF PRESENT ILLNESS Patient returns for cardiac followup. She is relatively inactive and is able to go to the store and walk around using a cart for support. She has had no recurrence of dyspnea like she did when her family went to [facility]. She has no angina. Her daughter who is a Publishing rights manager is with her today. She does have significant hypertrophic cardiomyopathy noted on the echo with what appears to be a gradient across the outflow tract. She in addition had significant pulmonary hypertension with  estimated PA pressures of around 65-70. She has only a mild amount of edema. She doesn't have angina and is still able to cut her grass on a riding lawnmower but does know that she becomes tired. Her knee is no longer bothering her and she thinks that she is going to defer surgery. ____________________________ PAST HISTORY  Past Medical Illnesses:  hypertension, DM-non-insulin dependent, hyperlipidemia, GERD, hiatal hernia, lumbar disc disease, cholelithiasis, history of anemia-iron deficieny, irritable bowel syndrome, cholelithiasis;  Cardiovascular Illnesses:  aortic stenosis, cardiomyopathy(hypertrophic);  Surgical Procedures:  ankle repair, cataract extraction OD, D, L wrist surgery, cholecystectomy (lap) 2009;  NYHA Classification:  II;  Canadian Angina Classification:  Class 0: Asymptomatic;  Cardiology Procedures-Invasive:  no previous interventional or invasive cardiology procedures;  Cardiology Procedures-Noninvasive:  treadmill cardiolite June 2010, echocardiogram July 2011, echocardiogram August 2012, echocardiogram August 2013, echocardiogram September 2014, echocardiogram October 2015;  LVEF of 65% documented via echocardiogram on 02/10/2014,   ____________________________ CARDIO-PULMONARY TEST DATES EKG Date:  04/26/2013;  Nuclear Study Date:  10/16/2008;  Echocardiography Date: 02/10/2014;  Chest Xray Date: 11/24/2006;   ____________________________ FAMILY HISTORY Brother -- Brother alive with problem, Diabetes mellitus, Coronary Artery Disease Brother -- Brother alive with problem, Coronary Artery Disease Brother -- Brother dead, Aortic aneurysm Father -- Father dead, Hodgkin disease Mother -- Mother dead, Death due to natural cause Sister -- Sister alive with problem, Diabetes mellitus Sister -- Sister alive and well ____________________________ SOCIAL HISTORY Alcohol Use:  no alcohol use;  Smoking:  never smoked;  Diet:  regular diet;  Lifestyle:  widowed;  Exercise:  some  exercise;  Occupation:  retired;  Residence:  lives alone;   ____________________________ REVIEW OF SYSTEMS General:  no change  in weight Eyes: wears eye glasses/contact lenses, cataract extraction bilaterally Respiratory: dyspnea with exertion Cardiovascular:  please review HPI Abdominal: occasional dysphagiaGenitourinary-Female: no dysuria, urgency, frequency, UTIs, or stress incontinenceNeurological:  occasional headaches  ____________________________ PHYSICAL EXAMINATION VITAL SIGNS  Blood Pressure:  128/70 Standing, Right arm, regular cuff  , 130/70 Standing, Right arm and regular cuff   Pulse:  82/min. Weight:  182.00 lbs. Height:  64"BMI: 31  Constitutional:  pleasant white female, in no acute distress, moderately obese Head:  normocephalic, normal hair pattern, no masses or tenderness ENT:  ears, nose and throat reveal no gross abnormalities.  Dentition good. Neck:  supple, without massess. No JVD, thyromegaly or carotid bruits. Carotid upstroke normal. Chest:  normal symmetry, clear to auscultation Cardiac:  regular rhythm, normal S1 and S2, no S3 or S4, grade 3/6 systolic murmur at aortic area radiating to neck Peripheral Pulses:  the femoral,dorsalis pedis, and posterior tibial pulses are full and equal bilaterally with no bruits auscultated. Extremities & Back:   1+ edema Neurological:  no gross motor or sensory deficits noted, affect appropriate, oriented x3. ____________________________ MOST RECENT LIPID PANEL 09/25/12  CHOL TOTL 222 mg/dl, LDL 956123 calc, HDL 84 mg/dl and TRIGLYCER 213132 mg/dl ____________________________ IMPRESSIONS/PLAN  1. Moderate to severe aortic stenosis 2. Hypertrophic cardiomyopathy with mid cavitary obstruction complicating the assessment of aortic stenosis 3. Moderate to severe pulmonary hypertension 4. Diabetes mellitus reasonable control 5. Hypertensive heart disease  Recommendations:  Long discussion with patient and daughter. I had  previously reviewed the echocardiogram with Dr. Tonny BollmanMichael Cooper. Because she has had dyspnea and does have pulmonary hypertension and recommended cardiac catheterization to assess how much of this is aortic stenosis and how much is from the outflow tract. I would like for Dr. Excell Seltzerooper to give an opinion about possible catheterization and assessment of this. Followup after she has seen Dr. Excell Seltzerooper. ____________________________ TODAYS ORDERS  1. Cardiology Consult: Excell Seltzerooper                       ____________________________ Cardiology Physician:  Darden PalmerW. Spencer Nazly Digilio, Jr. MD [facility]

## 2014-04-08 ENCOUNTER — Ambulatory Visit (INDEPENDENT_AMBULATORY_CARE_PROVIDER_SITE_OTHER): Payer: Medicare Other | Admitting: Cardiovascular Disease

## 2014-04-08 ENCOUNTER — Encounter: Payer: Self-pay | Admitting: Cardiovascular Disease

## 2014-04-08 VITALS — BP 134/64 | HR 70 | Ht 63.0 in | Wt 169.0 lb

## 2014-04-08 DIAGNOSIS — I350 Nonrheumatic aortic (valve) stenosis: Secondary | ICD-10-CM

## 2014-04-08 NOTE — Progress Notes (Signed)
HPI:   80 year-old woman presenting for evaluation of aortic stenosis. She is followed by Dr Donnie Ahoilley. The patient has been followed many years and reports a heart murmur since her 40's. She has been followed for hypertrophic cardiomyopathy and aortic stenosis. However, she only reports symptoms of shortness of breath over the last 2-3 months. She has been widowed now for over 3 years, and has had a sedentary lifestyle ever since her husband passed away. [place] and had an episode of profound shortness of breath with walking after she ate a large meal. She describes dyspnea with activity on occasion, but denies symptoms with light activity such as walking around her house or using her riding lawn mower. She has had a few episodes of chest discomfort radiating to the left shoulder, occurring after meals. She's attributed this to a hiatal hernia. She has had no symptoms of lightheadedness or dizziness and never has had syncope. The patient is here with her neighbor today. I spoke with her daughter, who happens to be a cardiology PA, over the telephone as part of this consultation.  Outpatient Encounter Prescriptions as of 04/08/2014  Medication Sig  . diltiazem (DILACOR XR) 240 MG 24 hr capsule Take 240 mg by mouth daily.  . furosemide (LASIX) 20 MG tablet Take 20 mg by mouth.  Marland Kitchen. glimepiride (AMARYL) 1 MG tablet Take 1 mg by mouth daily with breakfast.  . losartan (COZAAR) 100 MG tablet Take 100 mg by mouth daily.  . metFORMIN (GLUCOPHAGE) 500 MG tablet Take by mouth 2 (two) times daily with a meal.  . omeprazole (PRILOSEC) 20 MG capsule Take 20 mg by mouth 2 (two) times daily before a meal.  . potassium chloride (MICRO-K) 10 MEQ CR capsule Take 10 mEq by mouth daily.  . pravastatin (PRAVACHOL) 40 MG tablet Take 40 mg by mouth daily.  . sertraline (ZOLOFT) 100 MG tablet Take 100 mg by mouth daily.  . traZODone (DESYREL) 50 MG tablet Take 50 mg by mouth at bedtime.     . [DISCONTINUED] olmesartan (BENICAR) 20 MG tablet Take 20 mg by mouth daily.  . [DISCONTINUED] omeprazole (PRILOSEC) 20 MG capsule Take 20 mg by mouth 2 (two) times daily.  . [DISCONTINUED] omeprazole (PRILOSEC) 40 MG capsule Take 40 mg by mouth daily.  . [DISCONTINUED] potassium chloride (K-DUR,KLOR-CON) 10 MEQ tablet Take 10 mEq by mouth 2 (two) times daily.    Codeine  Past Medical History  Diagnosis Date  . Diabetes mellitus without complication   . Hypertension   . H/O hiatal hernia   . Anxiety   . Depression     Past Surgical History  Procedure Laterality Date  . Cholecystectomy    . Dilation and curettage of uterus    . Ankle fracture surgery Bilateral   . Wrist fracture surgery Left   . Carpal tunnel release Right 05/13/2013    Procedure: RIGHT CARPAL TUNNEL RELEASE;  Surgeon: Thera FlakeW D Caffrey Jr., MD;  Location: [facility];  Service: Orthopedics;  Laterality: Right;    History   Social History  . Marital Status: Widowed    Spouse Name: N/A    Number of Children: N/A  . Years of Education: N/A   Occupational History  . Not on file.   Social History Main Topics  . Smoking status: Never Smoker   . Smokeless tobacco: Not on file  . Alcohol Use: No  . Drug Use: No  . Sexual  Activity: Not on file   Other Topics Concern  . Not on file   Social History Narrative   Family hx: 2 Brothers have had multivessel CABG, one is his 46's, both are nonsmokers  ROS:  General: no fevers/chills/night sweats Eyes: no blurry vision, diplopia, or amaurosis ENT: no sore throat or hearing loss Resp: no cough, wheezing, or hemoptysis CV: no edema or palpitations GI: no abdominal pain, nausea, vomiting, diarrhea, or constipation GU: no dysuria, frequency, or hematuria Skin: no rash Neuro: no headache, numbness, tingling, or weakness of extremities Musculoskeletal: arthritis - hands Heme: no bleeding, DVT, or easy bruising Endo: no polydipsia or polyuria  BP  134/64 mmHg  Pulse 70  Ht 5\' 3"  (1.6 m)  Wt 169 lb (76.658 kg)  BMI 29.94 kg/m2  PHYSICAL EXAM: Pt is alert and oriented, WD, WN, pleasant elderly woman in no distress. HEENT: normal Neck: JVP normal. Carotid upstrokes normal with bilateral bruits. No thyromegaly. Lungs: equal expansion, clear bilaterally CV: Apex is discrete and nondisplaced, RRR with a grade 3/6 late peaking harsh systolic murmur at the RUSB, faint A2. Abd: soft, NT, +BS, no bruit, no hepatosplenomegaly Back: no CVA tenderness Ext: mild bilateral pretibial edema        DP/PT pulses intact and = Skin: warm and dry without rash Neuro: CNII-XII intact             Strength intact = bilaterally  ASSESSMENT AND PLAN: 80 year-old woman with progressive dyspnea on exertion, and occasional chest pain after eating. I have reviewed her echo images with Dr Donnie Aho and there appears to be significant aortic stenosis as well as LVOT obstruction/basal septal hypertrophy with preserved LV systolic function. Considering her progressive symptoms, I agree that cardiac catheterization is indicated to better delineate both the cause of her symptoms and her overall cardiac risk in the setting of aortic stenosis and a strong family history of multivessel CAD.  I have reviewed the risks, indications, and alternatives to cardiac catheterization and possible PCI were reviewed with the patient. Risks include but are not limited to bleeding, infection, vascular injury, stroke, myocardial infection, arrhythmia, kidney injury, radiation-related injury in the case of prolonged fluoroscopy use, emergency cardiac surgery, and death. The patient understands the risks of serious complication is low (<1%). I also reviewed the plan with her daughter over the telephone.  All questions were answered. Further plans pending cardiac catheterization results. Greater than 60 minutes was spent conducting this evaluation, over half of which was spent in face-to-face  discussion with the patient regarding problems/treatment plans outlined above.   [name], MD 04/08/2014 4:17 PM

## 2014-04-08 NOTE — Patient Instructions (Signed)
Your physician has requested that you have a cardiac catheterization. Cardiac catheterization is used to diagnose and/or treat various heart conditions. Doctors may recommend this procedure for a number of different reasons. The most common reason is to evaluate chest pain. Chest pain can be a symptom of coronary artery disease (CAD), and cardiac catheterization can show whether plaque is narrowing or blocking your heart's arteries. This procedure is also used to evaluate the valves, as well as measure the blood flow and oxygen levels in different parts of your heart. For further information please visit www.cardiosmart.org. Please follow instruction sheet, as given.   Your physician recommends that you continue on your current medications as directed. Please refer to the Current Medication list given to you today.  

## 2014-04-23 ENCOUNTER — Encounter (HOSPITAL_COMMUNITY): Payer: Self-pay | Admitting: Cardiovascular Disease

## 2014-04-23 ENCOUNTER — Encounter (HOSPITAL_COMMUNITY)
Admission: RE | Disposition: A | Discharge status: Home / Self Care | Payer: Self-pay | Source: Ambulatory Visit | Attending: Cardiovascular Disease

## 2014-04-23 ENCOUNTER — Ambulatory Visit (HOSPITAL_COMMUNITY)
Admission: RE | Admit: 2014-04-23 | Discharge: 2014-04-23 | Disposition: A | Discharge status: Home / Self Care | Payer: Medicare Other | Source: Ambulatory Visit | Attending: Cardiovascular Disease | Admitting: Cardiovascular Disease

## 2014-04-23 DIAGNOSIS — Z79899 Other long term (current) drug therapy: Secondary | ICD-10-CM | POA: Diagnosis not present

## 2014-04-23 DIAGNOSIS — I10 Essential (primary) hypertension: Secondary | ICD-10-CM | POA: Insufficient documentation

## 2014-04-23 DIAGNOSIS — R079 Chest pain, unspecified: Secondary | ICD-10-CM | POA: Diagnosis present

## 2014-04-23 DIAGNOSIS — I2510 Atherosclerotic heart disease of native coronary artery without angina pectoris: Secondary | ICD-10-CM | POA: Diagnosis not present

## 2014-04-23 DIAGNOSIS — E119 Type 2 diabetes mellitus without complications: Secondary | ICD-10-CM | POA: Insufficient documentation

## 2014-04-23 DIAGNOSIS — F329 Major depressive disorder, single episode, unspecified: Secondary | ICD-10-CM | POA: Insufficient documentation

## 2014-04-23 DIAGNOSIS — I428 Other cardiomyopathies: Secondary | ICD-10-CM | POA: Diagnosis not present

## 2014-04-23 DIAGNOSIS — I350 Nonrheumatic aortic (valve) stenosis: Secondary | ICD-10-CM | POA: Insufficient documentation

## 2014-04-23 DIAGNOSIS — I272 Other secondary pulmonary hypertension: Secondary | ICD-10-CM | POA: Insufficient documentation

## 2014-04-23 DIAGNOSIS — R0600 Dyspnea, unspecified: Secondary | ICD-10-CM | POA: Diagnosis present

## 2014-04-23 HISTORY — PX: LEFT AND RIGHT HEART CATHETERIZATION WITH CORONARY ANGIOGRAM: SHX5449

## 2014-04-23 LAB — POCT I-STAT 3, ART BLOOD GAS (G3+)
ACID-BASE EXCESS: 2 mmol/L (ref 0.0–2.0)
Bicarbonate: 27.7 mEq/L — ABNORMAL HIGH (ref 20.0–24.0)
O2 Saturation: 97 %
PO2 ART: 94 mmHg (ref 80.0–100.0)
TCO2: 29 mmol/L (ref 0–100)
pCO2 arterial: 44.6 mmHg (ref 35.0–45.0)
pH, Arterial: 7.401 (ref 7.350–7.450)

## 2014-04-23 LAB — PROTIME-INR
INR: 1.03 (ref 0.00–1.49)
Prothrombin Time: 13.6 seconds (ref 11.6–15.2)

## 2014-04-23 LAB — POCT I-STAT 3, VENOUS BLOOD GAS (G3P V)
ACID-BASE EXCESS: 1 mmol/L (ref 0.0–2.0)
BICARBONATE: 26.5 meq/L — AB (ref 20.0–24.0)
O2 Saturation: 67 %
TCO2: 28 mmol/L (ref 0–100)
pCO2, Ven: 47 mmHg (ref 45.0–50.0)
pH, Ven: 7.358 — ABNORMAL HIGH (ref 7.250–7.300)
pO2, Ven: 37 mmHg (ref 30.0–45.0)

## 2014-04-23 LAB — BASIC METABOLIC PANEL
Anion gap: 8 (ref 5–15)
BUN: 9 mg/dL (ref 6–23)
CO2: 29 mmol/L (ref 19–32)
Calcium: 9 mg/dL (ref 8.4–10.5)
Chloride: 105 mEq/L (ref 96–112)
Creatinine, Ser: 0.78 mg/dL (ref 0.50–1.10)
GFR calc Af Amer: 90 mL/min — ABNORMAL LOW (ref >90)
GFR, EST NON AFRICAN AMERICAN: 77 mL/min — AB (ref >90)
GLUCOSE: 145 mg/dL — AB (ref 70–99)
POTASSIUM: 3.9 mmol/L (ref 3.5–5.1)
SODIUM: 142 mmol/L (ref 135–145)

## 2014-04-23 LAB — CBC
HCT: 34.2 % — ABNORMAL LOW (ref 36.0–46.0)
Hemoglobin: 11.4 g/dL — ABNORMAL LOW (ref 12.0–15.0)
MCH: 27.4 pg (ref 26.0–34.0)
MCHC: 33.3 g/dL (ref 30.0–36.0)
MCV: 82.2 fL (ref 78.0–100.0)
PLATELETS: 191 10*3/uL (ref 150–400)
RBC: 4.16 MIL/uL (ref 3.87–5.11)
RDW: 14.6 % (ref 11.5–15.5)
WBC: 6 10*3/uL (ref 4.0–10.5)

## 2014-04-23 LAB — GLUCOSE, CAPILLARY
GLUCOSE-CAPILLARY: 136 mg/dL — AB (ref 70–99)
GLUCOSE-CAPILLARY: 120 mg/dL — AB (ref 70–99)

## 2014-04-23 SURGERY — LEFT AND RIGHT HEART CATHETERIZATION WITH CORONARY ANGIOGRAM
Anesthesia: LOCAL

## 2014-04-23 MED ORDER — HEPARIN SODIUM (PORCINE) 1000 UNIT/ML IJ SOLN
INTRAMUSCULAR | Status: AC
  Filled 2014-04-23: qty 1

## 2014-04-23 MED ORDER — FENTANYL CITRATE 0.05 MG/ML IJ SOLN
INTRAMUSCULAR | Status: AC
  Filled 2014-04-23: qty 2

## 2014-04-23 MED ORDER — HEPARIN (PORCINE) IN NACL 2-0.9 UNIT/ML-% IJ SOLN
INTRAMUSCULAR | Status: AC
  Filled 2014-04-23: qty 1000

## 2014-04-23 MED ORDER — ACETAMINOPHEN 325 MG PO TABS
ORAL_TABLET | ORAL | Status: AC
  Administered 2014-04-23: 650 mg via ORAL
  Filled 2014-04-23: qty 2

## 2014-04-23 MED ORDER — SODIUM CHLORIDE 0.9 % IV SOLN: 250.0000 mL | INTRAVENOUS | Status: DC | PRN

## 2014-04-23 MED ORDER — LIDOCAINE HCL (PF) 1 % IJ SOLN
INTRAMUSCULAR | Status: AC
  Filled 2014-04-23: qty 30

## 2014-04-23 MED ORDER — SODIUM CHLORIDE 0.9 % IV SOLN
INTRAVENOUS | Status: DC
  Administered 2014-04-23: 10:00:00 via INTRAVENOUS

## 2014-04-23 MED ORDER — ASPIRIN 81 MG PO CHEW
CHEWABLE_TABLET | ORAL | Status: AC
  Filled 2014-04-23: qty 1

## 2014-04-23 MED ORDER — MIDAZOLAM HCL 2 MG/2ML IJ SOLN
INTRAMUSCULAR | Status: AC
  Filled 2014-04-23: qty 2

## 2014-04-23 MED ORDER — SODIUM CHLORIDE 0.9 % IJ SOLN: 3.0000 mL | Freq: Two times a day (BID) | INTRAMUSCULAR | Status: DC

## 2014-04-23 MED ORDER — SODIUM CHLORIDE 0.9 % IJ SOLN: 3.0000 mL | INTRAMUSCULAR | Status: DC | PRN

## 2014-04-23 MED ORDER — ONDANSETRON HCL 4 MG/2ML IJ SOLN: 4.0000 mg | Freq: Four times a day (QID) | INTRAMUSCULAR | Status: DC | PRN

## 2014-04-23 MED ORDER — ACETAMINOPHEN 325 MG PO TABS
650.0000 mg | ORAL_TABLET | ORAL | Status: DC | PRN
  Administered 2014-04-23: 650 mg via ORAL

## 2014-04-23 MED ORDER — ASPIRIN 81 MG PO CHEW
81.0000 mg | CHEWABLE_TABLET | ORAL | Status: AC
  Administered 2014-04-23: 81 mg via ORAL

## 2014-04-23 MED ORDER — VERAPAMIL HCL 2.5 MG/ML IV SOLN
INTRAVENOUS | Status: AC
  Filled 2014-04-23: qty 2

## 2014-04-23 MED ORDER — SODIUM CHLORIDE 0.9 % IV SOLN: 1.0000 mL/kg/h | INTRAVENOUS | Status: DC

## 2014-04-23 NOTE — Discharge Instructions (Signed)
Radial Site Care °Refer to this sheet in the next few weeks. These instructions provide you with information on caring for yourself after your procedure. Your caregiver may also give you more specific instructions. Your treatment has been planned according to current medical practices, but problems sometimes occur. Call your caregiver if you have any problems or questions after your procedure. °HOME CARE INSTRUCTIONS °· You may shower the day after the procedure. Remove the bandage (dressing) and gently wash the site with plain soap and water. Gently pat the site dry. °· Do not apply powder or lotion to the site. °· Do not submerge the affected site in water for 3 to 5 days. °· Inspect the site at least twice daily. °· Do not flex or bend the affected arm for 24 hours. °· No lifting over 5 pounds (2.3 kg) for 5 days after your procedure. °· Do not drive home if you are discharged the same day of the procedure. Have someone else drive you. °· You may drive 24 hours after the procedure unless otherwise instructed by your caregiver. °· Do not operate machinery or power tools for 24 hours. °· A responsible adult should be with you for the first 24 hours after you arrive home. °What to expect: °· Any bruising will usually fade within 1 to 2 weeks. °· Blood that collects in the tissue (hematoma) may be painful to the touch. It should usually decrease in size and tenderness within 1 to 2 weeks. °SEEK IMMEDIATE MEDICAL CARE IF: °· You have unusual pain at the radial site. °· You have redness, warmth, swelling, or pain at the radial site. °· You have drainage (other than a small amount of blood on the dressing). °· You have chills. °· You have a fever or persistent symptoms for more than 72 hours. °· You have a fever and your symptoms suddenly get worse. °· Your arm becomes pale, cool, tingly, or numb. °· You have heavy bleeding from the site. Hold pressure on the site. °Document Released: 05/14/2010 Document Revised:  07/04/2011 Document Reviewed: 05/14/2010 °Exit[employer]® Patient Information ©2015 [employer]. This information is not intended to replace advice given to you by your health care provider. Make sure you discuss any questions you have with your health care provider. ° °

## 2014-04-23 NOTE — CV Procedure (Signed)
Cardiac Catheterization Procedure Note  Name: Lori SeashoreBetty P Gilbert MRN: [id number] DOB: 06/28/1932  Procedure: Right Heart Cath, Left Heart Cath, Selective Coronary Angiography, aortic root angiography  Indication: aortic stenosis. This 80 year-old woman has a history of hypertrophic cardiomyopathy and aortic stenosis. She has experienced progressive dyspnea with exertion and presents for right and left heart catheterization for further assessment of her coronary anatomy and hemodynamics.   Procedural Details: There was an indwelling IV in a right antecubital vein. Using normal sterile technique, the IV was changed out for a 5 Fr brachial sheath over a 0.018 inch wire. The right wrist was then prepped, draped, and anesthetized with 1% lidocaine. Using the modified Seldinger technique a 5/6 French Slender sheath was placed in the right radial artery. Intra-arterial verapamil was administered through the radial artery sheath. IV heparin was administered after a JR4 catheter was advanced into the central aorta. A Swan-Ganz catheter was used for the right heart catheterization. A glidewire had to be used to access the subcllavian vein and central venous circulation. Standard protocol was followed for recording of right heart pressures and sampling of oxygen saturations. Fick cardiac output was calculated. Standard Judkins catheters were used for selective coronary angiography. The left coronary artery was difficult to fill with contrast. I changed out to a 5 [place]French guide catheter to better visualize the coronaries. There was difficulty with catheter manipulation because of marked angulation between the innominate artery and ascending aorta. I was unable to cross the aortic valve with a pigtail catheter and wire. An AL-1 catheter and straight wire were utilized. Even with a wire at the apex of the left ventricle, I could not get a pigtail catheter across the aortic valve. An AL-1 catheter eventually was able to  track over a straight tip wire into the LV. The catheter was flushed and a transaortic valve pullback was done. An aortic root angiogram was then performed with a pigtail catheter. There were no immediate procedural complications. The patient was transferred to the post catheterization recovery area for further monitoring.  Procedural Findings: Hemodynamics RA 5 RV 64/6 PA 63/24 with a mean of 43 PCWP A wave 24, V wave 36, mean 25 LV 244/20 AO 131/70 with a mean of 95  Oxygen saturations: PA 67 AO 97  Cardiac Output (Fick) 5.4  Cardiac Index (Fick) 2.9   Aortic valve hemodynamics: Peak to peak gradient 113 mmHg, mean gradient 92 mmHg, aortic valve area 0.45 cm.  Coronary angiography: Coronary dominance: right  Left mainstem: The left main is long. The vessel is widely patent. There is some angulation of the distal left main and probable mild associated stenosis of no more than 50 %. The left main is mildly calcified.  Left anterior descending (LAD): The LAD reaches the apex of the heart. The vessel is mildly calcified. The LAD is tortuous throughout its midportion. There is 30-40 % stenosis in the proximal and mid vessel. The diagonal branches are widely patent.  Left circumflex (LCx): The left circumflex arises from the left main with marked angulation. The vessel is widely patent throughout. It supplies a single OM branch with no significant stenosis.  Right coronary artery (RCA): The RCA is a large, dominant vessel. There are diffuse irregularities proximally with approximately 20% stenosis. The mid and distal vessel are widely patent. The PDA branch is widely patent with marked tortuosity.  Aortic root angiography: There is severe aortic valve calcification with markedly restricted leaflet motion. There is no significant aortic valve  insufficiency. The proximal aortic root appears normal in caliber. There is very severe mitral annular calcification present.  Estimated Blood  Loss: Minimal  Final Conclusions:   1. Patent coronary arteries with mild to moderate distal left mainstem stenosis and minor irregularities of the LAD, left circumflex, and right coronary arteries 2. Severely calcified aortic valve with hemodynamic evidence of severe/critical aortic stenosis 3. Severe pulmonary hypertension likely related to LV diastolic dysfunction (large V waves in the PCWP pressure wave)  [name] MD, [facility] 04/23/2014, 12:16 PM

## 2014-04-23 NOTE — Interval H&P Note (Signed)
History and Physical Interval Note:  04/23/2014 10:28 AM  Lori Gilbert  has presented today for surgery, with the diagnosis of aortic stenosis  The various methods of treatment have been discussed with the patient and family. After consideration of risks, benefits and other options for treatment, the patient has consented to  Procedure(s): LEFT AND RIGHT HEART CATHETERIZATION WITH CORONARY ANGIOGRAM (N/A) as a surgical intervention .  The patient's history has been reviewed, patient examined, no change in status, stable for surgery.  I have reviewed the patient's chart and labs.  Questions were answered to the patient's satisfaction.     [name]

## 2014-04-23 NOTE — H&P (View-Only) (Signed)
 HPI:   80 year-old woman presenting for evaluation of aortic stenosis. She is followed by Dr Tilley. The patient has been followed many years and reports a heart murmur since her 40's. She has been followed for hypertrophic cardiomyopathy and aortic stenosis. However, she only reports symptoms of shortness of breath over the last 2-3 months. She has been widowed now for over 3 years, and has had a sedentary lifestyle ever since her husband passed away. She went to [place] earlier this year and had an episode of profound shortness of breath with walking after she ate a large meal. She describes dyspnea with activity on occasion, but denies symptoms with light activity such as walking around her house or using her riding lawn mower. She has had a few episodes of chest discomfort radiating to the left shoulder, occurring after meals. She's attributed this to a hiatal hernia. She has had no symptoms of lightheadedness or dizziness and never has had syncope. The patient is here with her neighbor today. I spoke with her daughter, who happens to be a cardiology PA, over the telephone as part of this consultation.  Outpatient Encounter Prescriptions as of 04/08/2014  Medication Sig  . diltiazem (DILACOR XR) 240 MG 24 hr capsule Take 240 mg by mouth daily.  . furosemide (LASIX) 20 MG tablet Take 20 mg by mouth.  . glimepiride (AMARYL) 1 MG tablet Take 1 mg by mouth daily with breakfast.  . losartan (COZAAR) 100 MG tablet Take 100 mg by mouth daily.  . metFORMIN (GLUCOPHAGE) 500 MG tablet Take by mouth 2 (two) times daily with a meal.  . omeprazole (PRILOSEC) 20 MG capsule Take 20 mg by mouth 2 (two) times daily before a meal.  . potassium chloride (MICRO-K) 10 MEQ CR capsule Take 10 mEq by mouth daily.  . pravastatin (PRAVACHOL) 40 MG tablet Take 40 mg by mouth daily.  . sertraline (ZOLOFT) 100 MG tablet Take 100 mg by mouth daily.  . traZODone (DESYREL) 50 MG tablet Take 50 mg by mouth at bedtime.     . [DISCONTINUED] olmesartan (BENICAR) 20 MG tablet Take 20 mg by mouth daily.  . [DISCONTINUED] omeprazole (PRILOSEC) 20 MG capsule Take 20 mg by mouth 2 (two) times daily.  . [DISCONTINUED] omeprazole (PRILOSEC) 40 MG capsule Take 40 mg by mouth daily.  . [DISCONTINUED] potassium chloride (K-DUR,KLOR-CON) 10 MEQ tablet Take 10 mEq by mouth 2 (two) times daily.    Codeine  Past Medical History  Diagnosis Date  . Diabetes mellitus without complication   . Hypertension   . H/O hiatal hernia   . Anxiety   . Depression     Past Surgical History  Procedure Laterality Date  . Cholecystectomy    . Dilation and curettage of uterus    . Ankle fracture surgery Bilateral   . Wrist fracture surgery Left   . Carpal tunnel release Right 05/13/2013    Procedure: RIGHT CARPAL TUNNEL RELEASE;  Surgeon: W D Caffrey Jr., MD;  Location: [facility];  Service: Orthopedics;  Laterality: Right;    History   Social History  . Marital Status: Widowed    Spouse Name: N/A    Number of Children: N/A  . Years of Education: N/A   Occupational History  . Not on file.   Social History Main Topics  . Smoking status: Never Smoker   . Smokeless tobacco: Not on file  . Alcohol Use: No  . Drug Use: No  . Sexual   Activity: Not on file   Other Topics Concern  . Not on file   Social History Narrative   Family hx: 2 Brothers have had multivessel CABG, one is his 46's, both are nonsmokers  ROS:  General: no fevers/chills/night sweats Eyes: no blurry vision, diplopia, or amaurosis ENT: no sore throat or hearing loss Resp: no cough, wheezing, or hemoptysis CV: no edema or palpitations GI: no abdominal pain, nausea, vomiting, diarrhea, or constipation GU: no dysuria, frequency, or hematuria Skin: no rash Neuro: no headache, numbness, tingling, or weakness of extremities Musculoskeletal: arthritis - hands Heme: no bleeding, DVT, or easy bruising Endo: no polydipsia or polyuria  BP  134/64 mmHg  Pulse 70  Ht 5\' 3"  (1.6 m)  Wt 169 lb (76.658 kg)  BMI 29.94 kg/m2  PHYSICAL EXAM: Pt is alert and oriented, WD, WN, pleasant elderly woman in no distress. HEENT: normal Neck: JVP normal. Carotid upstrokes normal with bilateral bruits. No thyromegaly. Lungs: equal expansion, clear bilaterally CV: Apex is discrete and nondisplaced, RRR with a grade 3/6 late peaking harsh systolic murmur at the RUSB, faint A2. Abd: soft, NT, +BS, no bruit, no hepatosplenomegaly Back: no CVA tenderness Ext: mild bilateral pretibial edema        DP/PT pulses intact and = Skin: warm and dry without rash Neuro: CNII-XII intact             Strength intact = bilaterally  ASSESSMENT AND PLAN: 80 year-old woman with progressive dyspnea on exertion, and occasional chest pain after eating. I have reviewed her echo images with Dr Donnie Aho and there appears to be significant aortic stenosis as well as LVOT obstruction/basal septal hypertrophy with preserved LV systolic function. Considering her progressive symptoms, I agree that cardiac catheterization is indicated to better delineate both the cause of her symptoms and her overall cardiac risk in the setting of aortic stenosis and a strong family history of multivessel CAD.  I have reviewed the risks, indications, and alternatives to cardiac catheterization and possible PCI were reviewed with the patient. Risks include but are not limited to bleeding, infection, vascular injury, stroke, myocardial infection, arrhythmia, kidney injury, radiation-related injury in the case of prolonged fluoroscopy use, emergency cardiac surgery, and death. The patient understands the risks of serious complication is low (<1%). I also reviewed the plan with her daughter over the telephone.  All questions were answered. Further plans pending cardiac catheterization results. Greater than 60 minutes was spent conducting this evaluation, over half of which was spent in face-to-face  discussion with the patient regarding problems/treatment plans outlined above.   [name], MD 04/08/2014 4:17 PM

## 2014-04-28 DIAGNOSIS — I509 Heart failure, unspecified: Secondary | ICD-10-CM | POA: Diagnosis not present

## 2014-04-28 DIAGNOSIS — I501 Left ventricular failure: Secondary | ICD-10-CM | POA: Diagnosis not present

## 2014-04-28 DIAGNOSIS — J811 Chronic pulmonary edema: Secondary | ICD-10-CM | POA: Diagnosis not present

## 2014-04-28 DIAGNOSIS — J40 Bronchitis, not specified as acute or chronic: Secondary | ICD-10-CM | POA: Diagnosis not present

## 2014-04-28 DIAGNOSIS — K219 Gastro-esophageal reflux disease without esophagitis: Secondary | ICD-10-CM | POA: Diagnosis not present

## 2014-04-28 DIAGNOSIS — J90 Pleural effusion, not elsewhere classified: Secondary | ICD-10-CM | POA: Diagnosis not present

## 2014-04-28 DIAGNOSIS — J9801 Acute bronchospasm: Secondary | ICD-10-CM | POA: Diagnosis not present

## 2014-04-29 ENCOUNTER — Institutional Professional Consult (permissible substitution) (INDEPENDENT_AMBULATORY_CARE_PROVIDER_SITE_OTHER): Payer: Medicare Other | Admitting: Surgery

## 2014-04-29 ENCOUNTER — Encounter: Payer: Self-pay | Admitting: Surgery

## 2014-04-29 VITALS — BP 115/62 | HR 79 | Resp 16 | Ht 61.0 in | Wt 179.0 lb

## 2014-04-29 DIAGNOSIS — I350 Nonrheumatic aortic (valve) stenosis: Secondary | ICD-10-CM

## 2014-04-29 DIAGNOSIS — I2510 Atherosclerotic heart disease of native coronary artery without angina pectoris: Secondary | ICD-10-CM | POA: Diagnosis not present

## 2014-04-29 DIAGNOSIS — I2583 Coronary atherosclerosis due to lipid rich plaque: Secondary | ICD-10-CM

## 2014-04-30 ENCOUNTER — Telehealth: Payer: Self-pay | Admitting: Cardiovascular Disease

## 2014-04-30 DIAGNOSIS — Z01812 Encounter for preprocedural laboratory examination: Secondary | ICD-10-CM

## 2014-04-30 NOTE — Telephone Encounter (Signed)
New message       Pt cannot have procedure done on Friday 05/02/14   please reschedule with pt

## 2014-04-30 NOTE — Telephone Encounter (Signed)
Spoke with patient. Tells me she wanted to cancel because her daughter couldn't bring her to TEE, but now her daughter can bring her so do not cancel the procedure. States someone called her about this, and they told her someone will call her back with the instructions.  I provided patient with instructions for TEE over the phone.  She wrote down the details and verbalizes understanding.  She will arrive at short stay at 11:30 am this Friday. Instructed patient she will need labs prior to TEE, she can come tomorrow.  She has appointment with Dr. Donnie Aho tomorrow. Call placed to Dr. York Spaniel office to see if they are who is requesting the TEE.   Awaiting return call.

## 2014-04-30 NOTE — Telephone Encounter (Signed)
No return call back as of this time. Pt has appointment tomorrow at Dr. York Spaniel office. I do not have a physician to order her pre procedure labs under, since I don't know who set up this procedure.

## 2014-05-01 ENCOUNTER — Encounter: Payer: Self-pay | Admitting: Surgery

## 2014-05-01 ENCOUNTER — Encounter: Payer: Self-pay | Admitting: Cardiology

## 2014-05-01 DIAGNOSIS — I2510 Atherosclerotic heart disease of native coronary artery without angina pectoris: Secondary | ICD-10-CM | POA: Insufficient documentation

## 2014-05-01 DIAGNOSIS — I119 Hypertensive heart disease without heart failure: Secondary | ICD-10-CM | POA: Diagnosis not present

## 2014-05-01 DIAGNOSIS — I421 Obstructive hypertrophic cardiomyopathy: Secondary | ICD-10-CM | POA: Diagnosis not present

## 2014-05-01 DIAGNOSIS — E119 Type 2 diabetes mellitus without complications: Secondary | ICD-10-CM | POA: Diagnosis not present

## 2014-05-01 DIAGNOSIS — I350 Nonrheumatic aortic (valve) stenosis: Secondary | ICD-10-CM | POA: Diagnosis not present

## 2014-05-01 DIAGNOSIS — IMO0002 Reserved for concepts with insufficient information to code with codable children: Secondary | ICD-10-CM

## 2014-05-01 HISTORY — DX: Reserved for concepts with insufficient information to code with codable children: IMO0002

## 2014-05-01 NOTE — Telephone Encounter (Signed)
FYI  PT  HAVING  TEE TOMORROW WITH   DR Eden Emms  NOT  SURE  WHO  NEEDS  TO DO ORDERS./CY

## 2014-05-01 NOTE — Progress Notes (Signed)
   PCP is WELLS, CHERYL, FNP Primary Cardiologist: Spencer Tilley, MD Referring Provider is Cooper, Michael D, MD  Chief Complaint  Patient presents with  . Aortic Stenosis    severe...assess for surgery...cathed 04/23/14  . Coronary Artery Disease    HPI:  The patient is a 81 year old woman with aortic stenosis and hypertrophic cardiomyopathy that have been followed by Dr. Tilley. She is here with her daughter who is a cardiology NP at [place]. The patient reports 3 months of shortness of breath with significant exertion. She is widowed for the past 3 years and lives alone and is sedentary. She reports going to [place] with her family several months ago and having a severe episode of shortness of breath with walking after she ate a large meal. She has had no recurrence. She denies symptoms with low level activity in her house but moderate activity like brisk walking, lifting or going up stairs will bring on dyspnea. Her most recent echo was done by Dr. Tilley on 02/10/2014. Her daughter has a disk of it and I was able to review it. The aortic valve is severely stenotic, calcified and thickened with markedly restricted leaflet mobility. There is severe MAC and mild MR. There is severe concentric LVH with possible mid-cavitary obstruction when the septum and lateral wall meet. The EF is 65-70%. The mean AV gradient is 75 mm Hg and the peak is 127 mm Hg. The LVOT peak gradient was measured at 4.8 mm Hg. The AVA was 0.63 cm2 by Vmax. She underwent R and L heart cath by Dr. Cooper on 04/23/2014 which showed a peak to peak AV gradient of 113 mm Hg with a mean of 92 and an AVA of 0.45. There was severe pulmonary hypertension with large V waves of 36. There was some angulation at the distal LM and possibly some mild associated stenosis in some views but no other significant stenosis.    Past Medical History  Diagnosis Date  . Diabetes mellitus without complication   . Hypertension   . H/O  hiatal hernia   . Anxiety   . Depression     Past Surgical History  Procedure Laterality Date  . Cholecystectomy    . Dilation and curettage of uterus    . Ankle fracture surgery Bilateral   . Wrist fracture surgery Left   . Carpal tunnel release Right 05/13/2013    Procedure: RIGHT CARPAL TUNNEL RELEASE;  Surgeon: W D Caffrey Jr., MD;  Location: [facility];  Service: Orthopedics;  Laterality: Right;  . Left and right heart catheterization with coronary angiogram N/A 04/23/2014    Procedure: LEFT AND RIGHT HEART CATHETERIZATION WITH CORONARY ANGIOGRAM;  Surgeon: Michael D Cooper, MD;  Location: [facility];  Service: Cardiovascular;  Laterality: N/A;    History reviewed. No pertinent family history.  Social History History  Substance Use Topics  . Smoking status: Never Smoker   . Smokeless tobacco: Not on file  . Alcohol Use: No    Current Outpatient Prescriptions  Medication Sig Dispense Refill  . diltiazem (DILACOR XR) 240 MG 24 hr capsule Take 240 mg by mouth daily.    . furosemide (LASIX) 20 MG tablet Take 20 mg by mouth daily.     . glimepiride (AMARYL) 1 MG tablet Take 1 mg by mouth daily with breakfast.    . guaiFENesin-codeine (CHERATUSSIN AC) 100-10 MG/5ML syrup Take 5 mLs by mouth at bedtime.    . IRON PO   Take 1 tablet by mouth daily.    . levofloxacin (LEVAQUIN) 500 MG tablet Take 500 mg by mouth daily.    . losartan (COZAAR) 100 MG tablet Take 100 mg by mouth daily.  3  . metFORMIN (GLUCOPHAGE) 500 MG tablet Take 500 mg by mouth 2 (two) times daily with a meal.     . omeprazole (PRILOSEC) 20 MG capsule Take 20 mg by mouth 2 (two) times daily before a meal.    . potassium chloride (MICRO-K) 10 MEQ CR capsule Take 10 mEq by mouth daily.  2  . pravastatin (PRAVACHOL) 40 MG tablet Take 40 mg by mouth daily.    . sertraline (ZOLOFT) 100 MG tablet Take 100 mg by mouth daily.    . traZODone (DESYREL) 50 MG tablet Take 50 mg by mouth at bedtime.      No  current facility-administered medications for this visit.    Allergies  Allergen Reactions  . Codeine     hyper    Review of Systems  Constitutional: Negative for fever, activity change, appetite change and fatigue.  HENT: Negative.   Eyes: Negative.   Respiratory: Positive for cough and shortness of breath. Negative for chest tightness.   Cardiovascular: Positive for leg swelling. Negative for chest pain.  Gastrointestinal:       Hiatal hernia  Occasional swallowing difficulty  Occasional diarrhea  Endocrine: Negative.   Genitourinary: Negative.   Musculoskeletal: Negative.   Allergic/Immunologic: Negative.   Neurological: Negative for dizziness and syncope.  Hematological: Negative.   Psychiatric/Behavioral: Negative.     BP 115/62 mmHg  Pulse 79  Resp 16  Ht 5' 1" (1.549 m)  Wt 179 lb (81.194 kg)  BMI 33.84 kg/m2  SpO2 97% Physical Exam  Constitutional: She is oriented to person, place, and time.  Elderly woman in no distress  HENT:  Head: Normocephalic and atraumatic.  Mouth/Throat: Oropharynx is clear and moist.  Eyes: EOM are normal. Pupils are equal, round, and reactive to light.  Neck: Normal range of motion. Neck supple. No thyromegaly present.  Transmitted murmur to both sides of the neck  Cardiovascular: Normal rate and regular rhythm.   Murmur heard. 3/6 harsh systolic murmur along RSB  Pulmonary/Chest: Effort normal and breath sounds normal. No respiratory distress. She has no rales.  Abdominal: Soft. Bowel sounds are normal. She exhibits no distension and no mass. There is no tenderness.  Musculoskeletal: Normal range of motion. She exhibits no edema or tenderness.  Lymphadenopathy:    She has no cervical adenopathy.  Neurological: She is alert and oriented to person, place, and time. She has normal strength. No cranial nerve deficit or sensory deficit.  Skin: Skin is warm and dry.  Psychiatric: She has a normal mood and affect.     Diagnostic  Tests:  Cardiac Catheterization Procedure Note  Name: Lori Gilbert MRN: [id number] DOB: 01/29/1933  Procedure: Right Heart Cath, Left Heart Cath, Selective Coronary Angiography, aortic root angiography  Indication: aortic stenosis. This 81 year-old woman has a history of hypertrophic cardiomyopathy and aortic stenosis. She has experienced progressive dyspnea with exertion and presents for right and left heart catheterization for further assessment of her coronary anatomy and hemodynamics.   Procedural Details: There was an indwelling IV in a right antecubital vein. Using normal sterile technique, the IV was changed out for a 5 Fr brachial sheath over a 0.018 inch wire. The right wrist was then prepped, draped, and anesthetized with 1% lidocaine. Using the modified Seldinger   technique a 5/6 French Slender sheath was placed in the right radial artery. Intra-arterial verapamil was administered through the radial artery sheath. IV heparin was administered after a JR4 catheter was advanced into the central aorta. A Swan-Ganz catheter was used for the right heart catheterization. A glidewire had to be used to access the subcllavian vein and central venous circulation. Standard protocol was followed for recording of right heart pressures and sampling of oxygen saturations. Fick cardiac output was calculated. Standard Judkins catheters were used for selective coronary angiography. The left coronary artery was difficult to fill with contrast. I changed out to a 5 French guide catheter to better visualize the coronaries. There was difficulty with catheter manipulation because of marked angulation between the innominate artery and ascending aorta. I was unable to cross the aortic valve with a pigtail catheter and wire. An AL-1 catheter and straight wire were utilized. Even with a wire at the apex of the left ventricle, I could not get a pigtail catheter across the aortic valve. An AL-1 catheter eventually was  able to track over a straight tip wire into the LV. The catheter was flushed and a transaortic valve pullback was done. An aortic root angiogram was then performed with a pigtail catheter. There were no immediate procedural complications. The patient was transferred to the post catheterization recovery area for further monitoring.  Procedural Findings: Hemodynamics RA 5 RV 64/6 PA 63/24 with a mean of 43 PCWP A wave 24, V wave 36, mean 25 LV 244/20 AO 131/70 with a mean of 95  Oxygen saturations: PA 67 AO 97  Cardiac Output (Fick) 5.4  Cardiac Index (Fick) 2.9  Aortic valve hemodynamics: Peak to peak gradient 113 mmHg, mean gradient 92 mmHg, aortic valve area 0.45 cm.  Coronary angiography: Coronary dominance: right  Left mainstem: The left main is long. The vessel is widely patent. There is some angulation of the distal left main and probable mild associated stenosis of no more than 50 %. The left main is mildly calcified.  Left anterior descending (LAD): The LAD reaches the apex of the heart. The vessel is mildly calcified. The LAD is tortuous throughout its midportion. There is 30-40 % stenosis in the proximal and mid vessel. The diagonal branches are widely patent.  Left circumflex (LCx): The left circumflex arises from the left main with marked angulation. The vessel is widely patent throughout. It supplies a single OM branch with no significant stenosis.  Right coronary artery (RCA): The RCA is a large, dominant vessel. There are diffuse irregularities proximally with approximately 20% stenosis. The mid and distal vessel are widely patent. The PDA branch is widely patent with marked tortuosity.  Aortic root angiography: There is severe aortic valve calcification with markedly restricted leaflet motion. There is no significant aortic valve insufficiency. The proximal aortic root appears normal in caliber. There is very severe mitral annular calcification  present.  Estimated Blood Loss: Minimal  Final Conclusions:  1. Patent coronary arteries with mild to moderate distal left mainstem stenosis and minor irregularities of the LAD, left circumflex, and right coronary arteries 2. Severely calcified aortic valve with hemodynamic evidence of severe/critical aortic stenosis 3. Severe pulmonary hypertension likely related to LV diastolic dysfunction (large V waves in the PCWP pressure wave)  Michael Cooper MD, FACC 04/23/2014, 12:16 PM    STS Risk Calculator  Procedure: AV Replacement   Risk of Mortality: 2.259%  Morbidity or Mortality: 13.135%  Long Length of Stay: 5.597%  Short Length of Stay:   34.487%  Permanent Stroke: 1.586%  Prolonged Ventilation: 7.882%  DSW Infection: 0.276%  Renal Failure: 3.594%  Reoperation: 5.953%     Impression:  She has symptomatic severe/critical AS with an AV peak velocity of 5.65 m/sec and severe concentric LVH with hyperdynamic systolic function and possible mid-cavitary obstruction during systole. The question is whether there is a distinct septal bulge that is causing the obstruction and contributing to the high gradient and could be resected. The 2D images are pretty good but I think a TEE may help with decision making. Her aortic valve is clearly severely stenotic and with her hyperdynamic LV I am not that surprised at her high gradient. A TEE will also allow precise measurement of her annular dimension which could influence the decision to do conventional open AVR vs TAVR. Her estimated mortality risk for open surgical AVR is only 2.2% and not high enough to justify TAVR unless there are other factors that make TAVR a better option for her, such as small annular size or a calcified aorta. I reviewed the echocardiogram with the patient and her daughter and discussed the indications for open surgical AVR and TAVR. I answered all of their questions. She would like to proceed with TEE.  Plan:  I will  schedule a TEE and will then review it with Dr. Tilley and the Heart Valve Team before meeting with the patient and daughter again to discuss the results and my recommendations.    I spent 80 minutes performing this consultation and > 50% of this time was spent face to face counseling and coordinating the care of this patient's severe/critical aortic stenosis.  

## 2014-05-01 NOTE — Progress Notes (Signed)
Patient ID: Lori Gilbert, female   DOB: 09/12/1932, 81 y.o.   MRN: [id number]   Lori, Gilbert  Date of visit:  05/01/2014 DOB:  [birth year]    Age:  81 yrs. Medical record number:  [id number]     Account number:  [id number] Primary Care Provider: Beverlyn Roux ____________________________ CURRENT DIAGNOSES  1. Nonrheumatic aortic (valve) stenosis  2. Hypertrophic obstructive cardiomyopathy  3. Hyperlipidemia, unspecified  4. Hypertensive heart disease without heart failure  5. Iron deficiency anemia, unspecified  6. Type 2 diabetes mellitus without complications  7. Gastro-esophageal reflux disease without esophagitis ____________________________ ALLERGIES  Bextra, Intolerance-unknown  Codeine, Intolerance-unknown  Monopril, Intolerance-unknown ____________________________ MEDICATIONS  1. omeprazole 20 mg tablet,delayed release (DR/EC), BID  2. sertraline 100 mg tablet, 1 p.o. daily  3. glimepiride 1 mg tablet, 1 p.o. daily  4. furosemide 20 mg tablet, 1 p.o. daily  5. trazodone 50 mg tablet, QHS  6. diltiazem ER 240 mg capsule,extended release, 1 p.o. daily  7. pravastatin 40 mg tablet, 1 p.o. daily  8. losartan 100 mg tablet, 1 p.o. daily  9. potassium chloride ER 10 mEq tablet,extended release, 1 p.o. daily  10. metformin 500 mg tablet, BID ____________________________ CHIEF COMPLAINTS  Followup of Nonrheumatic aortic (valve) stenosis ____________________________ HISTORY OF PRESENT ILLNESS  Patient seen for cardiac followup. She had a catheterization done through the radial approach by Dr. per last week that showed areas severe aortic stenosis and moderate pulmonary hypertension. She tolerated the catheterization well. She evidently saw Dr. Laneta Simmers earlier this week but there is no note in the chart yet. She is due to have a TEE to assess her septum to determine if she would need to have resection of any tissue at the time of her aortic valve replacement as she has had  significant mid cavitary obliteration in the past. She does have some dyspnea. She denies angina and has no PND, orthopnea or edema. The TEE is supposed to be done tomorrow and surgery evidently will be scheduled for later. She also states that she will be seeing the dentist prior to surgery. ____________________________ PAST HISTORY  Past Medical Illnesses:  hypertension, DM-non-insulin dependent, hyperlipidemia, GERD, hiatal hernia, lumbar disc disease, cholelithiasis, history of anemia-iron deficieny, irritable bowel syndrome, cholelithiasis;  Cardiovascular Illnesses:  aortic stenosis, cardiomyopathy(hypertrophic);  Surgical Procedures:  ankle repair, cataract extraction OD, D, L wrist surgery, cholecystectomy (lap) 2009;  NYHA Classification:  II;  Canadian Angina Classification:  Class 0: Asymptomatic;  Cardiology Procedures-Invasive:  cardiac cath (left) December 2015;  Cardiology Procedures-Noninvasive:  treadmill cardiolite June 2010, echocardiogram July 2011, echocardiogram August 2012, echocardiogram August 2013, echocardiogram September 2014, echocardiogram October 2015;  Cardiac Cath Results:  no significant disease, severe aortic stenosis;  LVEF of 65% documented via echocardiogram on 02/10/2014,   ____________________________ CARDIO-PULMONARY TEST DATES EKG Date:  04/26/2013;  Nuclear Study Date:  10/16/2008;  Echocardiography Date: 02/10/2014;  Chest Xray Date: 11/24/2006;   ____________________________ FAMILY HISTORY Brother -- Brother alive with problem, Diabetes mellitus, Coronary Artery Disease Brother -- Brother alive with problem, Coronary Artery Disease Brother -- Brother dead, Aortic aneurysm Father -- Father dead, Hodgkin disease Mother -- Mother dead, Death due to natural cause Sister -- Sister alive with problem, Diabetes mellitus Sister -- Sister alive and well ____________________________ SOCIAL HISTORY Alcohol Use:  no alcohol use;  Smoking:  never smoked;  Diet:   regular diet;  Lifestyle:  widowed;  Exercise:  some exercise;  Occupation:  retired;  Residence:  lives alone;  ____________________________ REVIEW OF SYSTEMS General:  no change in weight Eyes: wears eye glasses/contact lenses, cataract extraction bilaterally Respiratory: dyspnea with exertion Cardiovascular:  please review HPI Abdominal: occasional dysphagiaGenitourinary-Female: no dysuria, urgency, frequency, UTIs, or stress incontinenceNeurological:  occasional headaches  ____________________________ PHYSICAL EXAMINATION VITAL SIGNS  Blood Pressure:  124/70 Sitting, Left arm, regular cuff  , 118/70 Standing, Left arm and regular cuff   Pulse:  80/min. Weight:  184.00 lbs. Height:  64"BMI: 31  Constitutional:  pleasant white female, in no acute distress, moderately obese Head:  normocephalic, normal hair pattern, no masses or tenderness ENT:  ears, nose and throat reveal no gross abnormalities.  Dentition good. Neck:  supple, without massess. No JVD, thyromegaly or carotid bruits. Carotid upstroke normal. Chest:  normal symmetry, clear to auscultation Cardiac:  regular rhythm, normal S1 and S2, no S3 or S4, grade 3/6 systolic murmur at aortic area radiating to neck Peripheral Pulses:  the femoral,dorsalis pedis, and posterior tibial pulses are full and equal bilaterally with no bruits auscultated. Extremities & Back:   1+ edema, well healed radial cath site Neurological:  no gross motor or sensory deficits noted, affect appropriate, oriented x3. ____________________________ MOST RECENT LIPID PANEL 09/25/12  CHOL TOTL 222 mg/dl, LDL 093 calc, HDL 84 mg/dl and TRIGLYCER 235 mg/dl ____________________________ IMPRESSIONS/PLAN  1. Severe aortic stenosis with very high aortic valve gradient 2. Prior hypertrophic cardiomyopathy with mid cavitary obstruction 3. Obesity 4. Hypertensive heart disease  Recommendations:  She is to have a TEE tomorrow and I am awaiting followup from Dr.  Laneta Simmers and completion of his note. I would be glad to see her in the hospital when she has her surgery. ____________________________ TODAYS ORDERS  1. Return After Surgery is done:                        ____________________________ Cardiology Physician:  Darden Palmer MD [facility]

## 2014-05-02 ENCOUNTER — Encounter (HOSPITAL_COMMUNITY): Payer: Self-pay | Admitting: *Deleted

## 2014-05-02 ENCOUNTER — Encounter (HOSPITAL_COMMUNITY)
Admission: RE | Disposition: A | Discharge status: Home / Self Care | Payer: Self-pay | Source: Ambulatory Visit | Attending: Cardiovascular Disease

## 2014-05-02 ENCOUNTER — Ambulatory Visit (HOSPITAL_COMMUNITY)
Admission: RE | Admit: 2014-05-02 | Discharge: 2014-05-02 | Disposition: A | Discharge status: Home / Self Care | Payer: Medicare Other | Source: Ambulatory Visit | Attending: Cardiovascular Disease | Admitting: Cardiovascular Disease

## 2014-05-02 DIAGNOSIS — I2510 Atherosclerotic heart disease of native coronary artery without angina pectoris: Secondary | ICD-10-CM | POA: Insufficient documentation

## 2014-05-02 DIAGNOSIS — I340 Nonrheumatic mitral (valve) insufficiency: Secondary | ICD-10-CM | POA: Insufficient documentation

## 2014-05-02 DIAGNOSIS — F329 Major depressive disorder, single episode, unspecified: Secondary | ICD-10-CM | POA: Diagnosis not present

## 2014-05-02 DIAGNOSIS — Q211 Atrial septal defect: Secondary | ICD-10-CM | POA: Insufficient documentation

## 2014-05-02 DIAGNOSIS — E119 Type 2 diabetes mellitus without complications: Secondary | ICD-10-CM | POA: Insufficient documentation

## 2014-05-02 DIAGNOSIS — F419 Anxiety disorder, unspecified: Secondary | ICD-10-CM | POA: Insufficient documentation

## 2014-05-02 DIAGNOSIS — I700 Atherosclerosis of aorta: Secondary | ICD-10-CM | POA: Insufficient documentation

## 2014-05-02 DIAGNOSIS — I422 Other hypertrophic cardiomyopathy: Secondary | ICD-10-CM | POA: Insufficient documentation

## 2014-05-02 DIAGNOSIS — I350 Nonrheumatic aortic (valve) stenosis: Secondary | ICD-10-CM

## 2014-05-02 DIAGNOSIS — I10 Essential (primary) hypertension: Secondary | ICD-10-CM | POA: Diagnosis not present

## 2014-05-02 DIAGNOSIS — Z886 Allergy status to analgesic agent status: Secondary | ICD-10-CM | POA: Insufficient documentation

## 2014-05-02 HISTORY — PX: TEE WITHOUT CARDIOVERSION: SHX5443

## 2014-05-02 LAB — GLUCOSE, CAPILLARY: GLUCOSE-CAPILLARY: 101 mg/dL — AB (ref 70–99)

## 2014-05-02 SURGERY — ECHOCARDIOGRAM, TRANSESOPHAGEAL
Anesthesia: Moderate Sedation

## 2014-05-02 MED ORDER — SODIUM CHLORIDE 0.9 % IV SOLN
INTRAVENOUS | Status: DC
  Administered 2014-05-02: 500 mL via INTRAVENOUS

## 2014-05-02 MED ORDER — BUTAMBEN-TETRACAINE-BENZOCAINE 2-2-14 % EX AERO
INHALATION_SPRAY | CUTANEOUS | Status: DC | PRN
  Administered 2014-05-02: 2 via TOPICAL

## 2014-05-02 MED ORDER — FENTANYL CITRATE 0.05 MG/ML IJ SOLN
INTRAMUSCULAR | Status: DC | PRN
  Administered 2014-05-02: 25 ug via INTRAVENOUS

## 2014-05-02 MED ORDER — FENTANYL CITRATE 0.05 MG/ML IJ SOLN
INTRAMUSCULAR | Status: AC
  Filled 2014-05-02: qty 2

## 2014-05-02 MED ORDER — DIPHENHYDRAMINE HCL 50 MG/ML IJ SOLN
INTRAMUSCULAR | Status: AC
Start: 2014-05-02 — End: 2014-05-02
  Filled 2014-05-02: qty 1

## 2014-05-02 MED ORDER — MIDAZOLAM HCL 10 MG/2ML IJ SOLN
INTRAMUSCULAR | Status: DC | PRN
  Administered 2014-05-02 (×2): 2 mg via INTRAVENOUS

## 2014-05-02 MED ORDER — MIDAZOLAM HCL 5 MG/ML IJ SOLN
INTRAMUSCULAR | Status: AC
  Filled 2014-05-02: qty 2

## 2014-05-02 NOTE — Discharge Instructions (Addendum)
F/U Dr Laneta Simmers regarding surgery   Transesophageal Echocardiogram Transesophageal echocardiography (TEE) is a picture test of your heart using sound waves. The pictures taken can give very detailed pictures of your heart. This can help your doctor see if there are problems with your heart. TEE can check:  If your heart has blood clots in it.  How well your heart valves are working.  If you have an infection on the inside of your heart.  Some of the major arteries of your heart.  If your heart valve is working after a Psychologist, forensic.  Your heart before a procedure that uses a shock to your heart to get the rhythm back to normal. BEFORE THE PROCEDURE  Do not eat or drink for 6 hours before the procedure or as told by your doctor.  Make plans to have someone drive you home after the procedure. Do not drive yourself home.  An IV tube will be put in your arm. PROCEDURE  You will be given a medicine to help you relax (sedative). It will be given through the IV tube.  A numbing medicine will be sprayed or gargled in the back of your throat to help numb it.  The tip of the probe is placed into the back of your mouth. You will be asked to swallow. This helps to pass the probe into your esophagus.  Once the tip of the probe is in the right place, your doctor can take pictures of your heart.  You may feel pressure at the back of your throat. AFTER THE PROCEDURE  You will be taken to a recovery area so the sedative can wear off.  Your throat may be sore and scratchy. This will go away slowly over time.  You will go home when you are fully awake and able to swallow liquids.  You should have someone stay with you for the next 24 hours.  Do not drive or operate machinery for the next 24 hours. Document Released: 02/06/2009 Document Revised: 04/16/2013 Document Reviewed: 10/11/2012 [facility] Patient Information 2015 [place], [place]. This information is not intended to replace advice given to you  by your health care provider. Make sure you discuss any questions you have with your health care provider. Conscious Sedation, Adult, Care After Refer to this sheet in the next few weeks. These instructions provide you with information on caring for yourself after your procedure. Your health care provider may also give you more specific instructions. Your treatment has been planned according to current medical practices, but problems sometimes occur. Call your health care provider if you have any problems or questions after your procedure. WHAT TO EXPECT AFTER THE PROCEDURE  After your procedure:  You may feel sleepy, clumsy, and have poor balance for several hours.  Vomiting may occur if you eat too soon after the procedure. HOME CARE INSTRUCTIONS  Do not participate in any activities where you could become injured for at least 24 hours. Do not:  Drive.  Swim.  Ride a bicycle.  Operate heavy machinery.  Cook.  Use power tools.  Climb ladders.  Work from a high place.  Do not make important decisions or sign legal documents until you are improved.  If you vomit, drink water, juice, or soup when you can drink without vomiting. Make sure you have little or no nausea before eating solid foods.  Only take over-the-counter or prescription medicines for pain, discomfort, or fever as directed by your health care provider.  Make sure you and your  family fully understand everything about the medicines given to you, including what side effects may occur.  You should not drink alcohol, take sleeping pills, or take medicines that cause drowsiness for at least 24 hours.  If you smoke, do not smoke without supervision.  If you are feeling better, you may resume normal activities 24 hours after you were sedated.  Keep all appointments with your health care provider. SEEK MEDICAL CARE IF:  Your skin is pale or bluish in color.  You continue to feel nauseous or vomit.  Your pain is  getting worse and is not helped by medicine.  You have bleeding or swelling.  You are still sleepy or feeling clumsy after 24 hours. SEEK IMMEDIATE MEDICAL CARE IF:  You develop a rash.  You have difficulty breathing.  You develop any type of allergic problem.  You have a fever. MAKE SURE YOU:  Understand these instructions.  Will watch your condition.  Will get help right away if you are not doing well or get worse. Document Released: 01/30/2013 Document Reviewed: 01/30/2013 [facility] Patient Information 2015 [place], [place]. This information is not intended to replace advice given to you by your health care provider. Make sure you discuss any questions you have with your health care provider.

## 2014-05-02 NOTE — Interval H&P Note (Signed)
History and Physical Interval Note:  05/02/2014 3:00 PM  Lori Gilbert  has presented today for surgery, with the diagnosis of AORTIC STENOSIS  The various methods of treatment have been discussed with the patient and family. After consideration of risks, benefits and other options for treatment, the patient has consented to  Procedure(s): TRANSESOPHAGEAL ECHOCARDIOGRAM (TEE) (N/A) as a surgical intervention .  The patient's history has been reviewed, patient examined, no change in status, stable for surgery.  I have reviewed the patient's chart and labs.  Questions were answered to the patient's satisfaction.     Charlton Haws

## 2014-05-02 NOTE — H&P (View-Only) (Signed)
PCP is Marin Comment, FNP Primary Cardiologist: Viann Fish, MD Referring Provider is Micheline Chapman, MD  Chief Complaint  Patient presents with  . Aortic Stenosis    severe...assess for surgery...cathed 04/23/14  . Coronary Artery Disease    HPI:  The patient is a 81 year old woman with aortic stenosis and hypertrophic cardiomyopathy that have been followed by Dr. Donnie Aho. She is here with her daughter who is a cardiology NP at [facility]. The patient reports 3 months of shortness of breath with significant exertion. She is widowed for the past 3 years and lives alone and is sedentary. She reports going to [facility] with her family several months ago and having a severe episode of shortness of breath with walking after she ate a large meal. She has had no recurrence. She denies symptoms with low level activity in her house but moderate activity like brisk walking, lifting or going up stairs will bring on dyspnea. Her most recent echo was done by Dr. Donnie Aho on 02/10/2014. Her daughter has a disk of it and I was able to review it. The aortic valve is severely stenotic, calcified and thickened with markedly restricted leaflet mobility. There is severe MAC and mild MR. There is severe concentric LVH with possible mid-cavitary obstruction when the septum and lateral wall meet. The EF is 65-70%. The mean AV gradient is 75 mm Hg and the peak is 127 mm Hg. The LVOT peak gradient was measured at 4.8 mm Hg. The AVA was 0.63 cm2 by Vmax. She underwent R and L heart cath by Dr. Excell Seltzer on 04/23/2014 which showed a peak to peak AV gradient of 113 mm Hg with a mean of 92 and an AVA of 0.45. There was severe pulmonary hypertension with large V waves of 36. There was some angulation at the distal LM and possibly some mild associated stenosis in some views but no other significant stenosis.    Past Medical History  Diagnosis Date  . Diabetes mellitus without complication   . Hypertension   . H/O  hiatal hernia   . Anxiety   . Depression     Past Surgical History  Procedure Laterality Date  . Cholecystectomy    . Dilation and curettage of uterus    . Ankle fracture surgery Bilateral   . Wrist fracture surgery Left   . Carpal tunnel release Right 05/13/2013    Procedure: RIGHT CARPAL TUNNEL RELEASE;  Surgeon: Thera Flake., MD;  Location: [facility];  Service: Orthopedics;  Laterality: Right;  . Left and right heart catheterization with coronary angiogram N/A 04/23/2014    Procedure: LEFT AND RIGHT HEART CATHETERIZATION WITH CORONARY ANGIOGRAM;  Surgeon: Micheline Chapman, MD;  Location: [facility] CATH LAB;  Service: Cardiovascular;  Laterality: N/A;    History reviewed. No pertinent family history.  Social History History  Substance Use Topics  . Smoking status: Never Smoker   . Smokeless tobacco: Not on file  . Alcohol Use: No    Current Outpatient Prescriptions  Medication Sig Dispense Refill  . diltiazem (DILACOR XR) 240 MG 24 hr capsule Take 240 mg by mouth daily.    . furosemide (LASIX) 20 MG tablet Take 20 mg by mouth daily.     Marland Kitchen glimepiride (AMARYL) 1 MG tablet Take 1 mg by mouth daily with breakfast.    . guaiFENesin-codeine (CHERATUSSIN AC) 100-10 MG/5ML syrup Take 5 mLs by mouth at bedtime.    . IRON PO  Take 1 tablet by mouth daily.    Marland Kitchen levofloxacin (LEVAQUIN) 500 MG tablet Take 500 mg by mouth daily.    Marland Kitchen losartan (COZAAR) 100 MG tablet Take 100 mg by mouth daily.  3  . metFORMIN (GLUCOPHAGE) 500 MG tablet Take 500 mg by mouth 2 (two) times daily with a meal.     . omeprazole (PRILOSEC) 20 MG capsule Take 20 mg by mouth 2 (two) times daily before a meal.    . potassium chloride (MICRO-K) 10 MEQ CR capsule Take 10 mEq by mouth daily.  2  . pravastatin (PRAVACHOL) 40 MG tablet Take 40 mg by mouth daily.    . sertraline (ZOLOFT) 100 MG tablet Take 100 mg by mouth daily.    . traZODone (DESYREL) 50 MG tablet Take 50 mg by mouth at bedtime.      No  current facility-administered medications for this visit.    Allergies  Allergen Reactions  . Codeine     hyper    Review of Systems  Constitutional: Negative for fever, activity change, appetite change and fatigue.  HENT: Negative.   Eyes: Negative.   Respiratory: Positive for cough and shortness of breath. Negative for chest tightness.   Cardiovascular: Positive for leg swelling. Negative for chest pain.  Gastrointestinal:       Hiatal hernia  Occasional swallowing difficulty  Occasional diarrhea  Endocrine: Negative.   Genitourinary: Negative.   Musculoskeletal: Negative.   Allergic/Immunologic: Negative.   Neurological: Negative for dizziness and syncope.  Hematological: Negative.   Psychiatric/Behavioral: Negative.     BP 115/62 mmHg  Pulse 79  Resp 16  Ht 5\' 1"  (1.549 m)  Wt 179 lb (81.194 kg)  BMI 33.84 kg/m2  SpO2 97% Physical Exam  Constitutional: She is oriented to person, place, and time.  Elderly woman in no distress  HENT:  Head: Normocephalic and atraumatic.  Mouth/Throat: Oropharynx is clear and moist.  Eyes: EOM are normal. Pupils are equal, round, and reactive to light.  Neck: Normal range of motion. Neck supple. No thyromegaly present.  Transmitted murmur to both sides of the neck  Cardiovascular: Normal rate and regular rhythm.   Murmur heard. 3/6 harsh systolic murmur along RSB  Pulmonary/Chest: Effort normal and breath sounds normal. No respiratory distress. She has no rales.  Abdominal: Soft. Bowel sounds are normal. She exhibits no distension and no mass. There is no tenderness.  Musculoskeletal: Normal range of motion. She exhibits no edema or tenderness.  Lymphadenopathy:    She has no cervical adenopathy.  Neurological: She is alert and oriented to person, place, and time. She has normal strength. No cranial nerve deficit or sensory deficit.  Skin: Skin is warm and dry.  Psychiatric: She has a normal mood and affect.     Diagnostic  Tests:  Cardiac Catheterization Procedure Note  Name: Lori Gilbert MRN: [id number] DOB: 04-06-1933  Procedure: Right Heart Cath, Left Heart Cath, Selective Coronary Angiography, aortic root angiography  Indication: aortic stenosis. This 81 year-old woman has a history of hypertrophic cardiomyopathy and aortic stenosis. She has experienced progressive dyspnea with exertion and presents for right and left heart catheterization for further assessment of her coronary anatomy and hemodynamics.   Procedural Details: There was an indwelling IV in a right antecubital vein. Using normal sterile technique, the IV was changed out for a 5 Fr brachial sheath over a 0.018 inch wire. The right wrist was then prepped, draped, and anesthetized with 1% lidocaine. Using the modified Seldinger  technique a 5/6 French Slender sheath was placed in the right radial artery. Intra-arterial verapamil was administered through the radial artery sheath. IV heparin was administered after a JR4 catheter was advanced into the central aorta. A Swan-Ganz catheter was used for the right heart catheterization. A glidewire had to be used to access the subcllavian vein and central venous circulation. Standard protocol was followed for recording of right heart pressures and sampling of oxygen saturations. Fick cardiac output was calculated. Standard Judkins catheters were used for selective coronary angiography. The left coronary artery was difficult to fill with contrast. I changed out to a 5 [place] guide catheter to better visualize the coronaries. There was difficulty with catheter manipulation because of marked angulation between the innominate artery and ascending aorta. I was unable to cross the aortic valve with a pigtail catheter and wire. An AL-1 catheter and straight wire were utilized. Even with a wire at the apex of the left ventricle, I could not get a pigtail catheter across the aortic valve. An AL-1 catheter eventually was  able to track over a straight tip wire into the LV. The catheter was flushed and a transaortic valve pullback was done. An aortic root angiogram was then performed with a pigtail catheter. There were no immediate procedural complications. The patient was transferred to the post catheterization recovery area for further monitoring.  Procedural Findings: Hemodynamics RA 5 RV 64/6 PA 63/24 with a mean of 43 PCWP A wave 24, V wave 36, mean 25 LV 244/20 AO 131/70 with a mean of 95  Oxygen saturations: PA 67 AO 97  Cardiac Output (Fick) 5.4  Cardiac Index (Fick) 2.9  Aortic valve hemodynamics: Peak to peak gradient 113 mmHg, mean gradient 92 mmHg, aortic valve area 0.45 cm.  Coronary angiography: Coronary dominance: right  Left mainstem: The left main is long. The vessel is widely patent. There is some angulation of the distal left main and probable mild associated stenosis of no more than 50 %. The left main is mildly calcified.  Left anterior descending (LAD): The LAD reaches the apex of the heart. The vessel is mildly calcified. The LAD is tortuous throughout its midportion. There is 30-40 % stenosis in the proximal and mid vessel. The diagonal branches are widely patent.  Left circumflex (LCx): The left circumflex arises from the left main with marked angulation. The vessel is widely patent throughout. It supplies a single OM branch with no significant stenosis.  Right coronary artery (RCA): The RCA is a large, dominant vessel. There are diffuse irregularities proximally with approximately 20% stenosis. The mid and distal vessel are widely patent. The PDA branch is widely patent with marked tortuosity.  Aortic root angiography: There is severe aortic valve calcification with markedly restricted leaflet motion. There is no significant aortic valve insufficiency. The proximal aortic root appears normal in caliber. There is very severe mitral annular calcification  present.  Estimated Blood Loss: Minimal  Final Conclusions:  1. Patent coronary arteries with mild to moderate distal left mainstem stenosis and minor irregularities of the LAD, left circumflex, and right coronary arteries 2. Severely calcified aortic valve with hemodynamic evidence of severe/critical aortic stenosis 3. Severe pulmonary hypertension likely related to LV diastolic dysfunction (large V waves in the PCWP pressure wave)  [name] MD, [facility] 04/23/2014, 12:16 PM    STS Risk Calculator  Procedure: AV Replacement   Risk of Mortality: 2.259%  Morbidity or Mortality: 13.135%  Long Length of Stay: 5.597%  Short Length of Stay:  34.487%  Permanent Stroke: 1.586%  Prolonged Ventilation: 7.882%  DSW Infection: 0.276%  Renal Failure: 3.594%  Reoperation: 5.953%     Impression:  She has symptomatic severe/critical AS with an AV peak velocity of 5.65 m/sec and severe concentric LVH with hyperdynamic systolic function and possible mid-cavitary obstruction during systole. The question is whether there is a distinct septal bulge that is causing the obstruction and contributing to the high gradient and could be resected. The 2D images are pretty good but I think a TEE may help with decision making. Her aortic valve is clearly severely stenotic and with her hyperdynamic LV I am not that surprised at her high gradient. A TEE will also allow precise measurement of her annular dimension which could influence the decision to do conventional open AVR vs TAVR. Her estimated mortality risk for open surgical AVR is only 2.2% and not high enough to justify TAVR unless there are other factors that make TAVR a better option for her, such as small annular size or a calcified aorta. I reviewed the echocardiogram with the patient and her daughter and discussed the indications for open surgical AVR and TAVR. I answered all of their questions. She would like to proceed with TEE.  Plan:  I will  schedule a TEE and will then review it with Dr. Donnie Aho and the Heart Valve Team before meeting with the patient and daughter again to discuss the results and my recommendations.    I spent 80 minutes performing this consultation and > 50% of this time was spent face to face counseling and coordinating the care of this patient's severe/critical aortic stenosis.

## 2014-05-02 NOTE — Progress Notes (Signed)
  Echocardiogram Echocardiogram Transesophageal has been performed.  Leta Jungling M 05/02/2014, 2:24 PM

## 2014-05-02 NOTE — CV Procedure (Signed)
TEE: 5 mg versed 25 ug fentanyl  Severe LVH 64mm  EF 60%   No SAM or LVOT gradient Laminar flow below aortic valve Critical Aortic stenosis with severely calcified trileaflet valve mean gradient 92 mmHG Severe MAC with mild MR LAE PFO present Normal RA/RV No significant mural aortic debris for age No LAA thrombus No pericardial effusion Small aortic root  Charlton Haws

## 2014-05-05 ENCOUNTER — Encounter (HOSPITAL_COMMUNITY): Payer: Self-pay | Admitting: Cardiovascular Disease

## 2014-05-09 ENCOUNTER — Encounter (HOSPITAL_COMMUNITY): Payer: Self-pay | Admitting: Dentistry

## 2014-05-09 ENCOUNTER — Ambulatory Visit (HOSPITAL_COMMUNITY): Payer: Self-pay | Admitting: Dentistry

## 2014-05-09 VITALS — BP 133/68 | HR 74 | Temp 97.8°F

## 2014-05-09 DIAGNOSIS — Z01818 Encounter for other preprocedural examination: Secondary | ICD-10-CM | POA: Diagnosis not present

## 2014-05-09 DIAGNOSIS — IMO0002 Reserved for concepts with insufficient information to code with codable children: Secondary | ICD-10-CM

## 2014-05-09 DIAGNOSIS — K036 Deposits [accretions] on teeth: Secondary | ICD-10-CM

## 2014-05-09 DIAGNOSIS — K083 Retained dental root: Secondary | ICD-10-CM

## 2014-05-09 DIAGNOSIS — I350 Nonrheumatic aortic (valve) stenosis: Secondary | ICD-10-CM

## 2014-05-09 DIAGNOSIS — M264 Malocclusion, unspecified: Secondary | ICD-10-CM

## 2014-05-09 DIAGNOSIS — K08409 Partial loss of teeth, unspecified cause, unspecified class: Secondary | ICD-10-CM

## 2014-05-09 DIAGNOSIS — K0530 Chronic periodontitis, unspecified: Secondary | ICD-10-CM

## 2014-05-09 DIAGNOSIS — K089 Disorder of teeth and supporting structures, unspecified: Secondary | ICD-10-CM

## 2014-05-09 NOTE — Patient Instructions (Signed)
[place]    Department of Dental Medicine     DR. Orlandus Borowski      HEART VALVES AND MOUTH CARE:  FACTS:   If you have any infection in your mouth, it can infect your heart valve.  If you heart valve is infected, you will be seriously ill.  Infections in the mouth can be SILENT and do not always cause pain.  Examples of infections in the mouth are gum disease, dental cavities, and abscesses.  Some possible signs of infection are: Bad breath, bleeding gums, or teeth that are sensitive to sweets, hot, and/or cold. There are many other signs as well.  WHAT YOU HAVE TO DO:   Brush your teeth after meals and at bedtime. Spend at least 2 minutes brushing well, especially behind your back teeth and all around your teeth that stand alone. Brush at the gumline also.  Do not go to bed without brushing your teeth and flossing.  If you gums bleed when you brush or floss, do NOT stop brushing or flossing. It usually means that your gums need more attention and better cleaning.   If your Dentist or Dr. Noemy Hallmon gave you a prescription mouthwash to use, make sure to use it as directed. If you run out of the medication, get a refill at the pharmacy.   If you were given any other medications or directions by your Dentist, please follow them. If you did not understand the directions or forget what you were told, please call. We will be happy to refresh her memory.  If you need antibiotics before dental procedures, make sure you take them one hour prior to every dental visit as directed.   Get a dental checkup every 4-6 months in order to keep your mouth healthy, or to find and treat any new infection. You will most likely need your teeth cleaned or gums treated at the same time.  If you are not able to come in for your scheduled appointment, call your Dentist as soon as possible to reschedule.  If you have a problem in between dental visits, call your Dentist.  

## 2014-05-09 NOTE — Progress Notes (Signed)
DENTAL CONSULTATION  Date of Consultation:  05/09/2014 Patient Name:   Lori Gilbert Date of Birth:   06-26-1932 Medical Record Number: [id number]  VITALS: BP 133/68 mmHg  Pulse 74  Temp(Src) 97.8 F (36.6 C) (Oral)  CHIEF COMPLAINT: Patient is referred by Dr. Laneta Simmers for a dental consultation.  HPI: Lori Gilbert is a 81 year old female recently diagnosed with severe aortic stenosis. Patient with anticipated aortic valve replacement with Dr. Laneta Simmers. Patient is now seen as part of a medically necessary preaortic valve replacement dental protocol examination.  The patient currently denies acute toothaches, swellings, or abscesses. Patient was last seen by Dr. Gerilyn Pilgrim in [place], [place] for a dental filling around one year ago.  Patient denies having regular dental care and sees the dentist "when I need to". Patient denies having any partial dentures. Patient's last routine dental care was partially 5 years ago with Dr. Manson Passey before he retired.  PROBLEM LIST: Patient Active Problem List   Diagnosis Date Noted  . Aortic stenosis     Priority: High  . Pulmonary hypertension, secondary 05/01/2014  . CAD (coronary artery disease), native coronary artery   . Hypertrophic obstructive cardiomyopathy 03/25/2014  . Hypertensive heart disease 03/25/2014  . Diabetes mellitus type 2, noninsulin dependent 03/25/2014  . GERD (gastroesophageal reflux disease) 03/25/2014  . Obesity (BMI 30-39.9) 03/25/2014  . Dyspnea 03/25/2014  . Hyperlipidemia 03/25/2014    PMH: Past Medical History  Diagnosis Date  . Diabetes mellitus without complication   . Hypertension   . H/O hiatal hernia   . Anxiety   . Depression   . CAD (coronary artery disease), native coronary artery     Mild distal left main, 30-40% LAD, 20% RCA, normal circ at cath 04/23/14 Dr. Excell Seltzer   . GERD (gastroesophageal reflux disease) 03/25/2014  . Hypertensive heart disease 03/25/2014  . Hypertrophic obstructive  cardiomyopathy 03/25/2014  . Aortic stenosis     Severe at cath     [facility]: Past Surgical History  Procedure Laterality Date  . Cholecystectomy    . Dilation and curettage of uterus    . Ankle fracture surgery Bilateral   . Wrist fracture surgery Left   . Carpal tunnel release Right 05/13/2013    Procedure: RIGHT CARPAL TUNNEL RELEASE;  Surgeon: Thera Flake., MD;  Location: [facility];  Service: Orthopedics;  Laterality: Right;  . Left and right heart catheterization with coronary angiogram N/A 04/23/2014    Procedure: LEFT AND RIGHT HEART CATHETERIZATION WITH CORONARY ANGIOGRAM;  Surgeon: Micheline Chapman, MD;  Location: [facility] CATH LAB;  Service: Cardiovascular;  Laterality: N/A;  . Tee without cardioversion N/A 05/02/2014    Procedure: TRANSESOPHAGEAL ECHOCARDIOGRAM (TEE);  Surgeon: Wendall Stade, MD;  Location: [facility] ENDOSCOPY;  Service: Cardiovascular;  Laterality: N/A;    ALLERGIES: Allergies  Allergen Reactions  . Codeine     hyper    MEDICATIONS: Current Outpatient Prescriptions  Medication Sig Dispense Refill  . diltiazem (DILACOR XR) 240 MG 24 hr capsule Take 240 mg by mouth daily.    . furosemide (LASIX) 20 MG tablet Take 20 mg by mouth daily.     Marland Kitchen glimepiride (AMARYL) 1 MG tablet Take 1 mg by mouth daily with breakfast.    . guaiFENesin-codeine (CHERATUSSIN AC) 100-10 MG/5ML syrup Take 5 mLs by mouth at bedtime.    . IRON PO Take 1 tablet by mouth daily.    Marland Kitchen losartan (COZAAR) 100 MG tablet Take 100 mg by mouth daily.  3  . metFORMIN (GLUCOPHAGE) 500 MG tablet Take 500 mg by mouth 2 (two) times daily with a meal.     . omeprazole (PRILOSEC) 20 MG capsule Take 20 mg by mouth 2 (two) times daily before a meal.    . potassium chloride (MICRO-K) 10 MEQ CR capsule Take 10 mEq by mouth daily.  2  . pravastatin (PRAVACHOL) 40 MG tablet Take 40 mg by mouth daily.    . sertraline (ZOLOFT) 100 MG tablet Take 100 mg by mouth daily.    . traZODone (DESYREL) 50 MG tablet  Take 50 mg by mouth at bedtime.      No current facility-administered medications for this visit.    LABS: Lab Results  Component Value Date   WBC 6.0 04/23/2014   HGB 11.4* 04/23/2014   HCT 34.2* 04/23/2014   MCV 82.2 04/23/2014   PLT 191 04/23/2014      Component Value Date/Time   NA 142 04/23/2014 1000   K 3.9 04/23/2014 1000   CL 105 04/23/2014 1000   CO2 29 04/23/2014 1000   GLUCOSE 145* 04/23/2014 1000   BUN 9 04/23/2014 1000   CREATININE 0.78 04/23/2014 1000   CALCIUM 9.0 04/23/2014 1000   GFRNONAA 77* 04/23/2014 1000   GFRAA 90* 04/23/2014 1000   Lab Results  Component Value Date   INR 1.03 04/23/2014   No results found for: PTT  SOCIAL HISTORY: History   Social History  . Marital Status: Widowed    Spouse Name: N/A    Number of Children: N/A  . Years of Education: N/A   Occupational History  . Not on file.   Social History Main Topics  . Smoking status: Never Smoker   . Smokeless tobacco: Never Used  . Alcohol Use: No  . Drug Use: No  . Sexual Activity: Not on file   Other Topics Concern  . Not on file   Social History Narrative    FAMILY HISTORY: History reviewed. No pertinent family history.  REVIEW OF SYSTEMS: Reviewed with the patient included in dental record.  DENTAL HISTORY: CHIEF COMPLAINT: Patient is referred by Dr. Laneta Simmers for a dental consultation.  HPI: LEANNDRA Gilbert is a 81 year old female recently diagnosed with severe aortic stenosis. Patient with anticipated aortic valve replacement with Dr. Laneta Simmers. Patient is now seen as part of a medically necessary preaortic valve replacement dental protocol examination.  The patient currently denies acute toothaches, swellings, or abscesses. Patient was last seen by Dr. Gerilyn Pilgrim in [place], [place] for a dental filling around one year ago.  Patient denies having regular dental care and sees the dentist "when I need to". Patient denies having any partial dentures. Patient's last  routine dental care was partially 5 years ago with Dr. Manson Passey before he retired.  DENTAL EXAMINATION: GENERAL: The patient is a well-developed, well-nourished female in no acute distress. HEAD AND NECK: There is no palpable submandibular lymphadenopathy. The patient denies acute TMJ symptoms. Patient has a maximum interincisal opening of 45 mm. INTRAORAL EXAM: Patient has normal saliva. I do not see any evidence of abscess formation. DENTITION: Patient is missing tooth numbers 1, 2, 4, 12, 13, 16, 17, 18, 19, and 29. There are retained root tips in the area of tooth numbers 3 and 32 that are below the level of the gum with no obvious periapical pathology or symptoms of acute problems. PERIODONTAL: Patient has chronic periodontitis with significant plaque and calculus accumulations, gingival recession, and incipient mandibular anterior tooth mobility.  There is a periodontal defect on the facial aspect of tooth numbers 24 and 25 due to significant calculus accumulations. DENTAL CARIES/SUBOPTIMAL RESTORATIONS: Multiple suboptimal restorations and fractured teeth are noted. ENDODONTIC: Patient currently denies acute pulpitis symptoms. I do not see any evidence of periapical pathology at this time. CROWN AND BRIDGE: No crown or bridge restorations. Patient will need to be evaluated for future crown restorations on some of the teeth with large amalgam restorations. PROSTHODONTIC: Patient denies having any partial dentures. OCCLUSION: Patient has a poor occlusal scheme secondary to multiple missing teeth, supra-eruption and drifting of the unopposed teeth into the edentulous areas, multiple rotated teeth, and lack of replacement of all missing teeth with dental prostheses.  RADIOGRAPHIC INTERPRETATION: An orthopantogram was taken and supplemented with 12 periapical radiographs. There are multiple missing teeth. There are retained root tips in the area tooth numbers 3 and 32. There are multiple fractured  teeth and restorations noted. There is supra-eruption and drifting of the unopposed teeth into the edentulous areas. Multiple diastemas are noted. Radiographic calculus is noted. The bone density appears to be decreased and may be associated with either osteopenia or osteoporosis.  ASSESSMENTS: 1. Severe aortic stenosis 2. Preaortic valve replacement dental protocol examination 3. Chronic periodontitis with bone loss 4. Accretions 5. Gingival recession 6. Periodontal defect on the facial aspect of tooth numbers 24 and 25 7. Multiple missing teeth 8. Retained root tips in the area of tooth numbers 3 and 32 with no obvious persistent periapical pathology or radiolucency. 9. Multiple diastemas 10. Supra-eruption and drifting of the unopposed teeth into the edentulous areas 11. Multiple rotated and malpositioned teeth 12. Malocclusion 14. Multiple fractured teeth and dental restorations  PLAN/RECOMMENDATIONS: 1. I discussed the risks, benefits, and complications of various treatment options with the patient in relationship to her medical and dental conditions, anticipated aortic valve replacement, and risks for endocarditis. We discussed various treatment options to include no treatment, multiple extractions with alveoloplasty, pre-prosthetic surgery as indicated, periodontal therapy, dental restorations, root canal therapy, crown and bridge therapy, implant therapy, and replacement of missing teeth as indicated. The patient currently wishes to proceed with initial gross debridement periodontal procedure and restorations on tooth numbers 5, 30, and 31. The patient will then follow-up with a primary dentist in [place], [place] for continued periodontal therapy, additional dental restorations, and possible crown and bridge restorations and evaluation for replacement missing teeth as indicated after her aortic valve replacement once she is medically stable. The retained root tips in the area of  tooth numbers 3 and 32 will be followed at this time, as there does not appear to be any periapical pathology associated with these retained root tips and they have not been bothering the patient by her report.  2. Discussion of findings with medical team and coordination of future medical and dental care as needed.  I spent in excess of  120 minutes during the conduct of this consultation and >50% of this time involved direct face-to-face encounter for counseling and/or coordination of the patient's care.    Charlynne Pander, DDS

## 2014-05-14 ENCOUNTER — Telehealth (HOSPITAL_COMMUNITY): Payer: Self-pay

## 2014-05-14 ENCOUNTER — Other Ambulatory Visit (HOSPITAL_COMMUNITY): Payer: Self-pay | Admitting: Dentistry

## 2014-05-14 MED ORDER — CHLORHEXIDINE GLUCONATE 0.12 % MT SOLN: OROMUCOSAL | Status: DC

## 2014-05-14 MED ORDER — AMOXICILLIN 500 MG PO CAPS: ORAL_CAPSULE | ORAL | Status: DC

## 2014-05-15 ENCOUNTER — Encounter (HOSPITAL_COMMUNITY): Payer: Self-pay | Admitting: Dentistry

## 2014-05-21 ENCOUNTER — Other Ambulatory Visit: Payer: Self-pay | Admitting: Surgery

## 2014-05-21 ENCOUNTER — Ambulatory Visit (INDEPENDENT_AMBULATORY_CARE_PROVIDER_SITE_OTHER): Payer: Medicare Other | Admitting: Surgery

## 2014-05-21 VITALS — BP 121/59 | HR 68 | Resp 20 | Ht 61.0 in

## 2014-05-21 DIAGNOSIS — I2510 Atherosclerotic heart disease of native coronary artery without angina pectoris: Secondary | ICD-10-CM | POA: Diagnosis not present

## 2014-05-21 DIAGNOSIS — I350 Nonrheumatic aortic (valve) stenosis: Secondary | ICD-10-CM | POA: Diagnosis not present

## 2014-05-21 LAB — BASIC METABOLIC PANEL
BUN: 20 mg/dL (ref 6–23)
CHLORIDE: 106 meq/L (ref 96–112)
CO2: 26 mEq/L (ref 19–32)
CREATININE: 0.96 mg/dL (ref 0.50–1.10)
Calcium: 8.6 mg/dL (ref 8.4–10.5)
GLUCOSE: 101 mg/dL — AB (ref 70–99)
POTASSIUM: 3.8 meq/L (ref 3.5–5.3)
SODIUM: 140 meq/L (ref 135–145)

## 2014-05-22 ENCOUNTER — Encounter (HOSPITAL_COMMUNITY): Payer: Self-pay | Admitting: Dentistry

## 2014-05-22 ENCOUNTER — Other Ambulatory Visit: Payer: Self-pay | Admitting: *Deleted

## 2014-05-22 ENCOUNTER — Ambulatory Visit (HOSPITAL_COMMUNITY): Payer: Self-pay | Admitting: Dentistry

## 2014-05-22 VITALS — BP 128/53 | HR 72 | Temp 97.8°F

## 2014-05-22 DIAGNOSIS — K0530 Chronic periodontitis, unspecified: Secondary | ICD-10-CM

## 2014-05-22 DIAGNOSIS — K029 Dental caries, unspecified: Secondary | ICD-10-CM

## 2014-05-22 DIAGNOSIS — K036 Deposits [accretions] on teeth: Secondary | ICD-10-CM

## 2014-05-22 DIAGNOSIS — K089 Disorder of teeth and supporting structures, unspecified: Secondary | ICD-10-CM

## 2014-05-22 DIAGNOSIS — I350 Nonrheumatic aortic (valve) stenosis: Secondary | ICD-10-CM

## 2014-05-22 DIAGNOSIS — Z01818 Encounter for other preprocedural examination: Secondary | ICD-10-CM

## 2014-05-22 NOTE — Patient Instructions (Signed)
Patient to avoid chewing on the right side for 24-48 hours to allow amalgam to harden. Patient to use chlorhexidine rinses twice daily as prescribed. Patient to use salt water rinses as needed to aid healing. Patient to maintain proper oral hygiene with brushing after meals and at bedtime. Patient to avoid chewing hard food on the right side due to potential for fracture. Patient to return to clinic as scheduled for further amalgam restorations with antibiotic premedication. Patient is aware the potential for fracture of these large amalgam restorations with need for crown therapy in the future.   Charlynne Pander, DDS

## 2014-05-22 NOTE — Progress Notes (Signed)
05/22/2014  Patient:            Lori Gilbert Date of Birth:  Apr 13, 1933 MRN:                [id number]   BP 128/53 mmHg  Pulse 72  Temp(Src) 97.8 F (36.6 C) (Oral)  [name] presents for gross debridement and dental restoration procedures. Patient took amoxicillin 2.0 g one hour prior to dental appointment and invasive dental procedures. Patient did have some food this morning and is feeling acceptable for dental procedures.  Procedure: 72 mg of Xylocaine with 0.036 mg of epinephrine via mandibular right inferior alveolar nerve block, long buccal nerve block and infiltration. Gross debridement procedure performed with the sonic scaler and a series of hand curettes. Gross accumulations of tartar and calculus were removed. Oral hygiene instructions were provided. Sulcular brushing and use of chlorhexidine rinses twice daily as instructed. O7078 #31 MODBL/amalgam [place], tytin alloy. Burnish. [place].             Please Note: I removed previous extensive amalgam restoration. Distal lingual cusp was compromised and removed.  Quarter round bur retention was placed on the mesial and distal boxes in a slot fashion.                                  Amalgam restoration was then placed. No nerve exposure was noted. Patient is aware of the limited prognosis of this restoration and ideally should have a crown placed as soon as possible.                                  Patient was cautioned about chewing in this area for 24-48 hours. Patient also avoid chewing hard food that would predispose this tooth to fracture. Patient indicates that she will chew on her left side only.   Patient is to use chlorhexidine rinses twice daily as previously prescribed.  Patient is use warm salt water rinses in between there to aid healing. Patient is to brush her teeth more tentatively to prevent plaque and harder build up again. Patient was scheduled to return to clinic for subsequent dental restoration on tooth  #5 and 30 in the future. Patient is aware that there will be a space between tooth numbers 30 and 31 once that restoration is done and this space cannot be closed unless crowns are used to close the space.  Postop blood pressure was obtained.  BP: 144/54   P: 70 The patient tolerated the procedures well. Patient was dismissed to the care of her neighbor in a stable condition. Patient to return to clinic for dental restorations with antibiotic premedication as before.   Charlynne Pander, DDS

## 2014-05-23 ENCOUNTER — Encounter: Payer: Self-pay | Admitting: Surgery

## 2014-05-23 NOTE — Progress Notes (Signed)
HPI:  She returns today to discuss the result of her recent TEE. This showed severe AS with a mean gradient of 92 mm Hg. There is severe concentric LVH with an EF of 60% and no SAM or LVOT gradient. The aortic root appears small. She is symptomatically the same with no symptoms with low level activity around the house but is fatigued and doesn't do much. She will get short of breath with walking fast or up the stairs.   Current Outpatient Prescriptions  Medication Sig Dispense Refill  . amoxicillin (AMOXIL) 500 MG capsule Take four capsules one hour before dental appointment. 4 capsule 2  . chlorhexidine (PERIDEX) 0.12 % solution Rinse with 15 mls twice daily for 30 seconds. Use after breakfast and at bedtime. Spit out excess. Do not swallow. 480 mL 6  . diltiazem (DILACOR XR) 240 MG 24 hr capsule Take 240 mg by mouth daily.    . furosemide (LASIX) 20 MG tablet Take 20 mg by mouth daily.     Marland Kitchen glimepiride (AMARYL) 1 MG tablet Take 1 mg by mouth daily with breakfast.    . IRON PO Take 1 tablet by mouth daily.    Marland Kitchen losartan (COZAAR) 100 MG tablet Take 100 mg by mouth daily.  3  . metFORMIN (GLUCOPHAGE) 500 MG tablet Take 500 mg by mouth 2 (two) times daily with a meal.     . omeprazole (PRILOSEC) 20 MG capsule Take 20 mg by mouth 2 (two) times daily before a meal.    . potassium chloride (MICRO-K) 10 MEQ CR capsule Take 10 mEq by mouth daily.  2  . pravastatin (PRAVACHOL) 40 MG tablet Take 40 mg by mouth daily.    . sertraline (ZOLOFT) 100 MG tablet Take 100 mg by mouth daily.    . traZODone (DESYREL) 50 MG tablet Take 50 mg by mouth at bedtime.      No current facility-administered medications for this visit.     Physical Exam: BP 121/59 mmHg  Pulse 68  Resp 20  Ht  (1.549 m)  SpO2 95% She looks well Cardiac exam shows a harsh 3/6 systolic murmur over the aorta.  Lungs are clear There is no peripheral edema   Impression:  I have personally reviewed her TEE. She has  critical AS with maybe 50% distal LM stenosis. She had severe LVH, severe pulmonary hypertension and large V-waves on her cath with mild MR by echo. Her aortic root does look small and I think with her severe LVH that she will benefit from having the largest valve opening possible. She may require a root replacement or enlargement if she has open AVR which would significantly increase the risk in this 81 year old patient. Her STS risk of mortality for open AVR is fairly low but I think her risk would be much higher if she required root enlargement or replacement.  I think TAVR may be a reasonable alternative for her. I discussed the alternatives for treating aortic stenosis including open AVR, TAVR, and palliative care. Her daughter is a cardiology PA and they would like to pursue TAVR if we feel that she is a candidate. I told them that we would proceed with her cardiac and Abd/Pelvic CT and then have her return to see Dr. Cornelius Moras for a second surgical opinion. I told them that a final decision about TAVR has not been made yet and after reviewing her CT scans we may feel that open surgery  is indicated.   Plan:  She will have a cardiac CT and Abd/pelvic CT and then will return to see Dr. Cornelius Moras for a second surgical opinion.

## 2014-05-27 ENCOUNTER — Encounter: Payer: Self-pay | Admitting: Physical Therapy

## 2014-05-27 ENCOUNTER — Encounter (HOSPITAL_COMMUNITY): Payer: Self-pay

## 2014-05-27 ENCOUNTER — Ambulatory Visit (HOSPITAL_COMMUNITY)
Admission: RE | Admit: 2014-05-27 | Discharge: 2014-05-27 | Disposition: A | Discharge status: Home / Self Care | Payer: Medicare Other | Source: Ambulatory Visit | Attending: Surgery | Admitting: Surgery

## 2014-05-27 ENCOUNTER — Ambulatory Visit: Payer: Medicare Other | Attending: Surgery | Admitting: Physical Therapy

## 2014-05-27 DIAGNOSIS — I350 Nonrheumatic aortic (valve) stenosis: Secondary | ICD-10-CM | POA: Diagnosis not present

## 2014-05-27 DIAGNOSIS — R293 Abnormal posture: Secondary | ICD-10-CM

## 2014-05-27 DIAGNOSIS — R531 Weakness: Secondary | ICD-10-CM | POA: Diagnosis not present

## 2014-05-27 DIAGNOSIS — R269 Unspecified abnormalities of gait and mobility: Secondary | ICD-10-CM | POA: Diagnosis not present

## 2014-05-27 LAB — PULMONARY FUNCTION TEST
DL/VA % pred: 93 %
DL/VA: 4.5 ml/min/mmHg/L
DLCO unc % pred: 60 %
DLCO unc: 14.65 ml/min/mmHg
FEF 25-75 Post: 1.43 L/sec
FEF 25-75 Pre: 1.74 L/sec
FEF2575-%Change-Post: -17 %
FEF2575-%PRED-POST: 98 %
FEF2575-%Pred-Pre: 120 %
FEV1-%CHANGE-POST: 0 %
FEV1-%PRED-PRE: 94 %
FEV1-%Pred-Post: 94 %
FEV1-POST: 1.86 L
FEV1-Pre: 1.86 L
FEV1FVC-%CHANGE-POST: 0 %
FEV1FVC-%PRED-PRE: 100 %
FEV6-%Change-Post: 1 %
FEV6-%Pred-Post: 99 %
FEV6-%Pred-Pre: 98 %
FEV6-Post: 2.48 L
FEV6-Pre: 2.45 L
FEV6FVC-%PRED-PRE: 105 %
FEV6FVC-%Pred-Post: 105 %
FVC-%Change-Post: 0 %
FVC-%PRED-PRE: 94 %
FVC-%Pred-Post: 94 %
FVC-PRE: 2.48 L
FVC-Post: 2.48 L
POST FEV6/FVC RATIO: 100 %
PRE FEV6/FVC RATIO: 100 %
Post FEV1/FVC ratio: 75 %
Pre FEV1/FVC ratio: 75 %
RV % pred: 128 %
RV: 3.04 L
TLC % pred: 108 %
TLC: 5.5 L

## 2014-05-27 MED ORDER — IOHEXOL 350 MG/ML SOLN
80.0000 mL | Freq: Once | INTRAVENOUS | Status: AC | PRN
  Administered 2014-05-27: 80 mL via INTRAVENOUS

## 2014-05-27 MED ORDER — ALBUTEROL SULFATE (2.5 MG/3ML) 0.083% IN NEBU
2.5000 mg | INHALATION_SOLUTION | Freq: Once | RESPIRATORY_TRACT | Status: AC
  Administered 2014-05-27: 2.5 mg via RESPIRATORY_TRACT

## 2014-05-27 NOTE — Therapy (Signed)
[facility] [address] [place], [place], [address] Phone: [phone]   Fax:  [phone]  Physical Therapy Evaluation  Patient Details  Name: Lori Gilbert MRN: [id number] Date of Birth: 1932-05-27 Referring Provider:  Alleen Borne, MD  Encounter Date: 05/27/2014      PT End of Session - 05/27/14 1435    Visit Number 1   Number of Visits 1   PT Start Time 1430   PT Stop Time 1515   PT Time Calculation (min) 45 min      Past Medical History  Diagnosis Date  . Diabetes mellitus without complication   . Hypertension   . H/O hiatal hernia   . Anxiety   . Depression   . CAD (coronary artery disease), native coronary artery     Mild distal left main, 30-40% LAD, 20% RCA, normal circ at cath 04/23/14 Dr. Excell Seltzer   . GERD (gastroesophageal reflux disease) 03/25/2014  . Hypertensive heart disease 03/25/2014  . Hypertrophic obstructive cardiomyopathy 03/25/2014  . Aortic stenosis     Severe at cath     Past Surgical History  Procedure Laterality Date  . Cholecystectomy    . Dilation and curettage of uterus    . Ankle fracture surgery Bilateral   . Wrist fracture surgery Left   . Carpal tunnel release Right 05/13/2013    Procedure: RIGHT CARPAL TUNNEL RELEASE;  Surgeon: Thera Flake., MD;  Location: [facility];  Service: Orthopedics;  Laterality: Right;  . Left and right heart catheterization with coronary angiogram N/A 04/23/2014    Procedure: LEFT AND RIGHT HEART CATHETERIZATION WITH CORONARY ANGIOGRAM;  Surgeon: Micheline Chapman, MD;  Location: [facility] CATH LAB;  Service: Cardiovascular;  Laterality: N/A;  . Tee without cardioversion N/A 05/02/2014    Procedure: TRANSESOPHAGEAL ECHOCARDIOGRAM (TEE);  Surgeon: Wendall Stade, MD;  Location: [facility] ENDOSCOPY;  Service: Cardiovascular;  Laterality: N/A;    There were no vitals taken for this visit.  Visit Diagnosis:  Generalized weakness - Plan: PT plan of care  cert/re-cert  Abnormality of gait - Plan: PT plan of care cert/re-cert  Posture imbalance - Plan: PT plan of care cert/re-cert      Subjective Assessment - 05/27/14 1436    Symptoms Pt reports a decline in mobility over the last 1-1.5 years. Notices SOB with incr. activity with items like taking out the trash.    Patient Stated Goals avoid heart failure and stay mobile   Currently in Pain? No/denies          [facility] PT Assessment - 05/27/14 0001    Assessment   Medical Diagnosis aortic stenosis   Onset Date 05/27/13  pt unable to identify a clear onset date   Precautions   Precautions None   Restrictions   Weight Bearing Restrictions No   Balance Screen   Has the patient fallen in the past 6 months Yes  pt unsure of cause of the fall (collapse vs LOB)   How many times? 1  fell in the bathroom in Oct, cracked knee cap   Has the patient had a decrease in activity level because of a fear of falling?  No   Is the patient reluctant to leave their home because of a fear of falling?  No   Home Environment   Living Enviornment Private residence   Living Arrangements Alone   Type of Home House   Home Access Stairs to enter   Entrance Stairs-Number  of Steps 4   Entrance Stairs-Rails Right   Prior Function   Level of Independence --  independent   Posture/Postural Control   Posture/Postural Control Postural limitations   Postural Limitations Rounded Shoulders   AROM   Overall AROM Comments Shoulder ROM limited by 20% R>L   Strength   Overall Strength Comments UE: Shoulders 3/5 R slightly weaker than L, elbow 4-/5,    Grip (lbs) R 25    Grip (lbs) L 30   Ambulation/Gait   Ambulation/Gait Yes   Ambulation/Gait Assistance 7: Independent   Gait Pattern --  bil. hip ER to widen stance   Gait Comments Pt is mildly unsteady with gait but able to self recover.          [facility] Pre-Surgical Assessment - 05/27/14 0001    5 Meter Walk Test- trial 1 6.2 sec   5 Meter Walk Test-  trial 2 5 sec.    5 Meter Walk Test- trial 3 6 sec.  </= 6 sec WNL   5 meter walk test average 5.73 sec   Timed Up & Go Test trial  14.5 sec.   Comments >/= 12 seconds indicates increased fall risk   4 Stage Balance Test tolerated for:  3 sec.   4 Stage Balance Test Position 1   comment inability to hold position 3 for 10 sec indicates increased fall risk   Sit To Stand Test- trial 1 17.7 sec.   Comment </= 12.6 sec WNL   ADL/IADL Independent with: Bathing;Dressing;Meal prep;Finances   ADL/IADL Needs Assistance with: Pincus Badder work   ADL/IADL Fraility Index Vulnerable   6 Minute Walk- Baseline yes   BP (mmHg) 130/50 mmHg   HR (bpm) 66   02 Sat (%RA) 95 %   Modified Borg Scale for Dyspnea 0- Nothing at all   Perceived Rate of Exertion (Borg) 6-   6 Minute Walk Post Test yes   BP (mmHg) 150/60 mmHg   HR (bpm) 128   02 Sat (%RA) 93 %   Modified Borg Scale for Dyspnea 2- Mild shortness of breath   Perceived Rate of Exertion (Borg) 12-   Aerobic Endurance Distance Walked 782   Endurance additional comments Pt with 1:00 seated rest break at 3:00 due to HR >90% max HR (HR 128/Max 141). Recovered in 1 minute and resumed walking. Required rest again at 5:15 and able to resume after 30 seconds.                                    Plan - 05/27/14 1542    Clinical Impression Statement Pt is a 81 yo female presenting to OP PT for evaluation as she is considering a TAVR to address aortic stenosis. Pt demonstrates mild SOB during evaluation today. Pt's HR approaches maximum HR range after 3 minutes of walking on even surface. Pt was able to ambulate 782' in 6 minute walk test with 2 seated rest breaks due to HR range. Pt reports SOB at home when trying to take out trash - more noticeable some days than others. Pt also reports limiting her mobility overall. Pt is at an increased fall risk per 4 stage balance test - she was unable to stand independently with feet together and  required intermittent assist to do so. Pt is also at increased fall risk per Timed up and go. Pt's gait velocity considered WNL. Pt demonstrated general weakness throughout  and decr ROM in her shoulders.    PT Frequency One time visit   Consulted and Agree with Plan of Care Patient          G-Codes - 05/30/14 1544    Functional Assessment Tool Used 6 minute walk 782'    Functional Limitation Mobility: Walking and moving around   Mobility: Walking and Moving Around Current Status [phone]) At least 20 percent but less than 40 percent impaired, limited or restricted   Mobility: Walking and Moving Around Goal Status [phone]) At least 20 percent but less than 40 percent impaired, limited or restricted   Mobility: Walking and Moving Around Discharge Status [phone]) At least 20 percent but less than 40 percent impaired, limited or restricted       Problem List Patient Active Problem List   Diagnosis Date Noted  . Pulmonary hypertension, secondary 05/01/2014  . CAD (coronary artery disease), native coronary artery   . Aortic stenosis   . Hypertrophic obstructive cardiomyopathy 03/25/2014  . Hypertensive heart disease 03/25/2014  . Diabetes mellitus type 2, noninsulin dependent 03/25/2014  . GERD (gastroesophageal reflux disease) 03/25/2014  . Obesity (BMI 30-39.9) 03/25/2014  . Dyspnea 03/25/2014  . Hyperlipidemia 03/25/2014    Anthonee Gelin, PT 05/30/14, 3:46 PM  [facility] [address] [place], [place], [address] Phone: [phone]   Fax:  [phone]

## 2014-06-03 ENCOUNTER — Ambulatory Visit (HOSPITAL_COMMUNITY): Payer: Self-pay | Admitting: Dentistry

## 2014-06-03 ENCOUNTER — Encounter (HOSPITAL_COMMUNITY): Payer: Self-pay | Admitting: Dentistry

## 2014-06-03 VITALS — BP 144/60 | HR 67 | Temp 97.6°F

## 2014-06-03 DIAGNOSIS — K089 Disorder of teeth and supporting structures, unspecified: Secondary | ICD-10-CM

## 2014-06-03 DIAGNOSIS — I350 Nonrheumatic aortic (valve) stenosis: Secondary | ICD-10-CM

## 2014-06-03 DIAGNOSIS — K029 Dental caries, unspecified: Secondary | ICD-10-CM

## 2014-06-03 DIAGNOSIS — Z01818 Encounter for other preprocedural examination: Secondary | ICD-10-CM

## 2014-06-03 NOTE — Progress Notes (Signed)
06/03/2014  Patient:            Lori Gilbert Date of Birth:  1932-09-03 MRN:                [id number]   BP 144/60 mmHg  Pulse 67  Temp(Src) 97.6 F (36.4 C) (Oral)  [name] presents for dental restoration procedures. Patient took amoxicillin 2.0 g one hour prior to dental appointment for the invasive dental procedures. Patient did have some food this morning and is feeling acceptable for dental procedures.  Procedure: Preoperative 30 second Chlorhexidine rinse. 72 mg of Xylocaine with 0.036 mg of epinephrine via mandibular right inferior alveolar nerve block, long buccal nerve block and infiltration area #5. T2446 #5 MODB/Amalgam Gluma, Tytin alloy. Burnish. Carved occlusion to minimize fracture risk. [place]. K8638 #30 MODBL/Amalgam Gluma, Tytin alloy. Burnish. [place]. Please Note: I again removed previous extensive amalgam restoration. Quarter round bur retention was placed on the mesial and distal boxes in a slot fashion. Patient is aware of the limited prognosis for these restorations and ideally needs crown restoration as definitive treatment. Patient was cautioned about chewing in this area for 24-48 hours. Patient also avoid chewing hard food that would predispose these teeth for fracture. Patient indicates that she will chew on her left side only.   Patient is to use chlorhexidine rinses twice daily as previously prescribed.  Patient is use warm salt water rinses in between there to aid healing. Patient is to brush her teeth more tentatively to prevent plaque and harder build up again. Patient is aware that there will be a space between tooth numbers 30 /31 and 5/6 and this space can not be closed unless crowns are used to close the space.  Postop blood pressure was obtained.  BP: 131/56   P: 68 The patient tolerated the procedures well. Patient was dismissed to the care of her neighbor in a stable condition. Patient is now cleared for aortic valve replacement with Dr.  Laneta Simmers.  Charlynne Pander, DDS

## 2014-06-03 NOTE — Patient Instructions (Signed)
Patient to avoid chewing on the right side for 24-48 hours. Patient is aware of the risk for fracture of teeth with extensive dental restorations. Patient is aware of the need ideally 4 crown restorations. Patient use salt water rinses as needed aid healing. Patient use chlorhexidine rinses twice daily as prescribed. Patient is now cleared for aortic valve replacement with Dr. Laneta Simmers. Patient to follow-up with the general dentist of her choice for periodontal maintenance procedures and for evaluation for future crown or bridge restorations as indicated. Dr. Kristin Bruins

## 2014-06-09 ENCOUNTER — Encounter: Payer: Medicare Other | Admitting: Thoracic Surgery (Cardiothoracic Vascular Surgery)

## 2014-06-10 ENCOUNTER — Institutional Professional Consult (permissible substitution) (INDEPENDENT_AMBULATORY_CARE_PROVIDER_SITE_OTHER): Payer: Medicare Other | Admitting: Thoracic Surgery (Cardiothoracic Vascular Surgery)

## 2014-06-10 ENCOUNTER — Encounter: Payer: Self-pay | Admitting: Thoracic Surgery (Cardiothoracic Vascular Surgery)

## 2014-06-10 VITALS — BP 134/72 | HR 87 | Resp 16 | Ht 61.0 in | Wt 179.0 lb

## 2014-06-10 DIAGNOSIS — I350 Nonrheumatic aortic (valve) stenosis: Secondary | ICD-10-CM

## 2014-06-10 DIAGNOSIS — I509 Heart failure, unspecified: Secondary | ICD-10-CM

## 2014-06-10 DIAGNOSIS — I2510 Atherosclerotic heart disease of native coronary artery without angina pectoris: Secondary | ICD-10-CM

## 2014-06-10 DIAGNOSIS — I421 Obstructive hypertrophic cardiomyopathy: Secondary | ICD-10-CM

## 2014-06-10 DIAGNOSIS — I110 Hypertensive heart disease with heart failure: Secondary | ICD-10-CM | POA: Diagnosis not present

## 2014-06-10 DIAGNOSIS — I2583 Coronary atherosclerosis due to lipid rich plaque: Secondary | ICD-10-CM

## 2014-06-10 NOTE — Progress Notes (Signed)
[facility]  MULTIDISCIPLINARY HEART VALVE CLINIC    CARDIOTHORACIC SURGERY CONSULTATION REPORT  Referring Provider is Othella Boyer, MD PCP is Marin Comment, FNP  Chief Complaint  Patient presents with  . Aortic Stenosis    2nd TAVR EVAL..consulted with Dr. Laneta Simmers on 05/23/14    HPI:  Patient is a 81 year old widowed white female from [place] with severe symptomatic aortic stenosis, diabetes mellitus without complication, long-standing hypertension, and possible hypertrophic obstructive cardiopathy referred for a second surgical opinion to discuss treatment options.  The patient has a long history of a heart murmur and has been followed for several years by Dr. Donnie Aho with aortic stenosis and hypertrophic obstructive cardiomyopathy. Over the past few years the patient has developed exertional shortness of breath.  Follow-up echocardiogram performed by Dr. Donnie Aho on 02/10/2014 revealed severe aortic stenosis with peak velocity across aortic valve measured in excess of 4.8 m/s corresponding to peak and mean transvalvular gradients estimated 127 and 75 mmHg, respectively.  Left ventricular systolic function remained normal with ejection fraction estimated at 65-70%. There was severe concentric left ventricular hypertrophy and suggestion of possible mid cavitary dynamic left ventricular outflow tract obstruction. The patient was referred to Dr. Excell Seltzer who performed left and right heart catheterization 04/23/2014. The patient was found have normal coronary artery anatomy with nonobstructive coronary artery disease. Peak to peak and mean transvalvular gradients measured across the aortic valve measured 113 and 92 mmHg, respectively. Aortic valve area was calculated at 0.45 cm. There was no significant gradient measured across the left ventricular outflow tract. There was severe pulmonary hypertension and large V waves seen on pulmonary capillary wedge tracing.  The patient was  referred for surgical consultation and evaluated by Dr. Laneta Simmers on 79/10/2014.  The patient was noted to have appearance of a relatively small aortic annulus, and transesophageal echocardiogram was recommended to further characterize the size of the aortic annulus and the potential for significant dynamic left ventricular outflow tract obstruction. The patient underwent transesophageal echocardiogram by Dr. Eden Emms 05/02/2014.  This confirmed the presence of critical aortic stenosis with relatively small size aortic root and no sign of any significant dynamic left ventricular outflow tract obstruction. The patient has since then undergone CT angiography to further characterize the anatomical feasibility of transcatheter aortic valve replacement as an alternative to conventional surgery, and the patient now presents for a second surgical opinion to discuss treatment options further.  The patient is widowed and lives alone in [place]. She lives a somewhat sedentary lifestyle, although she still remains entirely functionally independent.  She states that her only significant physical limitation is that of exertional shortness of breath here she states that in retrospect she has been gradually developing exertional shortness of breath over the past 2-3 years.  This became most notable last summer when she was on a trip with her daughter at [facility]. She states that she still does not get short of breath with ordinary activities around the house, but with any degree of significant exertion she gets winded and tired. She has had to cut back on some of her physical activities. Up until recently she has attributed these symptoms to aging.  She denies any history of resting shortness of breath, PND, orthopnea, palpitations, dizzy spells, or syncope. She has not had any chest pain or chest tightness with activity. She has had some atypical pains in her left shoulder recently, but these symptoms come and go  sporadically, are typically transient, and are not  associated with physical activity or shortness of breath.  She has had a few falls recently that she states are not associated with dizziness or near syncopal episodes.  The patient was accompanied by a close friend for her office consultation this afternoon. The patient's daughter is a Publishing rights manager who works at [employer] but was unable to be present for today's consultation.  Past Medical History  Diagnosis Date  . Diabetes mellitus without complication   . Hypertension   . H/O hiatal hernia   . Anxiety   . Depression   . CAD (coronary artery disease), native coronary artery     Mild distal left main, 30-40% LAD, 20% RCA, normal circ at cath 04/23/14 Dr. Excell Seltzer   . GERD (gastroesophageal reflux disease) 03/25/2014  . Hypertensive heart disease 03/25/2014  . Hypertrophic obstructive cardiomyopathy 03/25/2014  . Aortic stenosis     Severe at cath     Past Surgical History  Procedure Laterality Date  . Cholecystectomy    . Dilation and curettage of uterus    . Ankle fracture surgery Bilateral   . Wrist fracture surgery Left   . Carpal tunnel release Right 05/13/2013    Procedure: RIGHT CARPAL TUNNEL RELEASE;  Surgeon: Thera Flake., MD;  Location: [facility];  Service: Orthopedics;  Laterality: Right;  . Left and right heart catheterization with coronary angiogram N/A 04/23/2014    Procedure: LEFT AND RIGHT HEART CATHETERIZATION WITH CORONARY ANGIOGRAM;  Surgeon: Micheline Chapman, MD;  Location: [facility] CATH LAB;  Service: Cardiovascular;  Laterality: N/A;  . Tee without cardioversion N/A 05/02/2014    Procedure: TRANSESOPHAGEAL ECHOCARDIOGRAM (TEE);  Surgeon: Wendall Stade, MD;  Location: [facility] ENDOSCOPY;  Service: Cardiovascular;  Laterality: N/A;    No family history on file.  History   Social History  . Marital Status: Widowed    Spouse Name: N/A  . Number of Children: N/A  . Years of Education: N/A     Occupational History  . Not on file.   Social History Main Topics  . Smoking status: Never Smoker   . Smokeless tobacco: Never Used  . Alcohol Use: No  . Drug Use: No  . Sexual Activity: Not on file   Other Topics Concern  . Not on file   Social History Narrative    Current Outpatient Prescriptions  Medication Sig Dispense Refill  . amoxicillin (AMOXIL) 500 MG capsule Take four capsules one hour before dental appointment. (Patient taking differently: Take four capsules one hour before dental appointment AS ORDERED) 4 capsule 2  . chlorhexidine (PERIDEX) 0.12 % solution Rinse with 15 mls twice daily for 30 seconds. Use after breakfast and at bedtime. Spit out excess. Do not swallow. 480 mL 6  . diltiazem (DILACOR XR) 240 MG 24 hr capsule Take 240 mg by mouth daily.    . furosemide (LASIX) 20 MG tablet Take 20 mg by mouth daily.     Marland Kitchen glimepiride (AMARYL) 1 MG tablet Take 1 mg by mouth daily with breakfast.    . IRON PO Take 1 tablet by mouth daily.    Marland Kitchen losartan (COZAAR) 100 MG tablet Take 100 mg by mouth daily.  3  . metFORMIN (GLUCOPHAGE) 500 MG tablet Take 500 mg by mouth 2 (two) times daily with a meal.     . omeprazole (PRILOSEC) 20 MG capsule Take 20 mg by mouth 2 (two) times daily before a meal.    . potassium chloride (MICRO-K) 10 MEQ  CR capsule Take 10 mEq by mouth daily.  2  . pravastatin (PRAVACHOL) 40 MG tablet Take 40 mg by mouth daily.    . sertraline (ZOLOFT) 100 MG tablet Take 100 mg by mouth daily.    . traZODone (DESYREL) 50 MG tablet Take 50 mg by mouth at bedtime.      No current facility-administered medications for this visit.    Allergies  Allergen Reactions  . Codeine     hyper      Review of Systems:   General:  normal appetite, decreased energy, no weight gain, no weight loss, no fever  Cardiac:  no chest pain with exertion, no chest pain at rest, + SOB with exertion, no resting SOB, no PND, no orthopnea, no palpitations, no arrhythmia, no  atrial fibrillation, no LE edema, no dizzy spells, no syncope  Respiratory:  + exertional shortness of breath, no home oxygen, no productive cough, no dry cough, no bronchitis, no wheezing, no hemoptysis, no asthma, no pain with inspiration or cough, no sleep apnea, no CPAP at night  GI:   occasional difficulty swallowing, no reflux, no frequent heartburn, no hiatal hernia, no abdominal pain, no constipation, occasional diarrhea, no hematochezia, no hematemesis, no melena  GU:   no dysuria,  no frequency, no urinary tract infection, no hematuria, no kidney stones, no kidney disease  Vascular:  no pain suggestive of claudication, no pain in feet, no leg cramps, no varicose veins, no DVT, no non-healing foot ulcer  Neuro:   no stroke, no TIA's, no seizures, no headaches, no temporary blindness one eye,  no slurred speech, no peripheral neuropathy, no chronic pain, no instability of gait, no memory/cognitive dysfunction  Musculoskeletal: mild arthritis, no joint swelling, no myalgias, no difficulty walking, normal mobility   Skin:   no rash, no itching, no skin infections, no pressure sores or ulcerations  Psych:   + anxiety, no depression, no nervousness, no unusual recent stress  Eyes:   no blurry vision, no floaters, no recent vision changes, + wears glasses or contacts  ENT:   no hearing loss, no loose or painful teeth, no dentures, last saw dentist this past month  Hematologic:  no easy bruising, no abnormal bleeding, no clotting disorder, no frequent epistaxis  Endocrine:  + diabetes, occasionally checks CBG's at home           Physical Exam:   BP 134/72 mmHg  Pulse 87  Resp 16  Ht 5\' 1"  (1.549 m)  Wt 179 lb (81.194 kg)  BMI 33.84 kg/m2  SpO2 95%  General:  Moderately obese, o/w  well-appearing  HEENT:  Unremarkable   Neck:   no JVD, no bruits, no adenopathy   Chest:   clear to auscultation, symmetrical breath sounds, no wheezes, no rhonchi   CV:   RRR, grade IV/VI  crescendo/decrescendo murmur heard best at RUSB,  no diastolic murmur  Abdomen:  soft, non-tender, no masses   Extremities:  warm, well-perfused, pulses diminished but palpable, + mild bilateral LE edema  Rectal/GU  Deferred  Neuro:   Grossly non-focal and symmetrical throughout  Skin:   Clean and dry, no rashes, no breakdown   Diagnostic Tests:   TRANSTHORACIC ECHOCARDIOGRAM  Both the images and report from transthoracic echocardiogram performed 02/10/2014 at Dr. York Spaniel office are reviewed. There is severe concentric left ventricular hypertrophy with normal left ventricular systolic function, ejection fraction estimated 65-70%. The peak velocity across the aortic valve measured 4.8 m/s corresponding to mean transvalvular gradient  estimated 75 mmHg. There was severe mitral annular calcification and mild mitral regurgitation. There was suggestion of possible mid cavitary left ventricular outflow tract obstruction.   Cardiac Catheterization Procedure Note  Name: MILLEY VINING MRN: [id number] DOB: 1933/03/14  Procedure: Right Heart Cath, Left Heart Cath, Selective Coronary Angiography, aortic root angiography  Indication: aortic stenosis. This 81 year-old woman has a history of hypertrophic cardiomyopathy and aortic stenosis. She has experienced progressive dyspnea with exertion and presents for right and left heart catheterization for further assessment of her coronary anatomy and hemodynamics.   Procedural Details: There was an indwelling IV in a right antecubital vein. Using normal sterile technique, the IV was changed out for a 5 Fr brachial sheath over a 0.018 inch wire. The right wrist was then prepped, draped, and anesthetized with 1% lidocaine. Using the modified Seldinger technique a 5/6 French Slender sheath was placed in the right radial artery. Intra-arterial verapamil was administered through the radial artery sheath. IV heparin was administered after a JR4 catheter was  advanced into the central aorta. A Swan-Ganz catheter was used for the right heart catheterization. A glidewire had to be used to access the subcllavian vein and central venous circulation. Standard protocol was followed for recording of right heart pressures and sampling of oxygen saturations. Fick cardiac output was calculated. Standard Judkins catheters were used for selective coronary angiography. The left coronary artery was difficult to fill with contrast. I changed out to a 5 [place] guide catheter to better visualize the coronaries. There was difficulty with catheter manipulation because of marked angulation between the innominate artery and ascending aorta. I was unable to cross the aortic valve with a pigtail catheter and wire. An AL-1 catheter and straight wire were utilized. Even with a wire at the apex of the left ventricle, I could not get a pigtail catheter across the aortic valve. An AL-1 catheter eventually was able to track over a straight tip wire into the LV. The catheter was flushed and a transaortic valve pullback was done. An aortic root angiogram was then performed with a pigtail catheter. There were no immediate procedural complications. The patient was transferred to the post catheterization recovery area for further monitoring.  Procedural Findings: Hemodynamics RA 5 RV 64/6 PA 63/24 with a mean of 43 PCWP A wave 24, V wave 36, mean 25 LV 244/20 AO 131/70 with a mean of 95  Oxygen saturations: PA 67 AO 97  Cardiac Output (Fick) 5.4  Cardiac Index (Fick) 2.9  Aortic valve hemodynamics: Peak to peak gradient 113 mmHg, mean gradient 92 mmHg, aortic valve area 0.45 cm.  Coronary angiography: Coronary dominance: right  Left mainstem: The left main is long. The vessel is widely patent. There is some angulation of the distal left main and probable mild associated stenosis of no more than 50 %. The left main is mildly calcified.  Left anterior descending (LAD):  The LAD reaches the apex of the heart. The vessel is mildly calcified. The LAD is tortuous throughout its midportion. There is 30-40 % stenosis in the proximal and mid vessel. The diagonal branches are widely patent.  Left circumflex (LCx): The left circumflex arises from the left main with marked angulation. The vessel is widely patent throughout. It supplies a single OM branch with no significant stenosis.  Right coronary artery (RCA): The RCA is a large, dominant vessel. There are diffuse irregularities proximally with approximately 20% stenosis. The mid and distal vessel are widely patent. The PDA branch is widely  patent with marked tortuosity.  Aortic root angiography: There is severe aortic valve calcification with markedly restricted leaflet motion. There is no significant aortic valve insufficiency. The proximal aortic root appears normal in caliber. There is very severe mitral annular calcification present.  Estimated Blood Loss: Minimal  Final Conclusions:  1. Patent coronary arteries with mild to moderate distal left mainstem stenosis and minor irregularities of the LAD, left circumflex, and right coronary arteries 2. Severely calcified aortic valve with hemodynamic evidence of severe/critical aortic stenosis 3. Severe pulmonary hypertension likely related to LV diastolic dysfunction (large V waves in the PCWP pressure wave)  [name] MD, [facility] 04/23/2014, 12:16 PM    TEE: 5 mg versed 25 ug fentanyl  Severe LVH 17mm EF 60%  No SAM or LVOT gradient Laminar flow below aortic valve Critical Aortic stenosis with severely calcified trileaflet valve mean gradient 92 mmHG Severe MAC with mild MR LAE PFO present Normal RA/RV No significant mural aortic debris for age No LAA thrombus No pericardial effusion Small aortic root  Charlton Haws    Cardiac TAVR CT  TECHNIQUE: The patient was scanned on a Philips 256 scanner. A 120 kV retrospective scan was  triggered in the descending thoracic aorta at 111 HU's. Gantry rotation speed was 270 msecs and collimation was .9 mm. No beta blockade or nitro were given. The 3D data set was reconstructed in 5% intervals of the R-R cycle. Systolic and diastolic phases were analyzed on a dedicated work station using MPR, MIP and VRT modes. The patient received 80 cc of contrast.  FINDINGS: Aortic Valve: Trileaflet Heavily calcified non coronary cusp. Heavily calcified right coronary cusp Left cusp is calcified toward the base extending into the annulus. The aortic root is small. There is severe MAC both anteriorly and posteriorly  Aorta: Normal origin of arch vessel. Mild calcification of the inferior surface of arch. Mild mural descending aortic debris. No ulcers or mobile plaque  LAA: No thrombus  PFO: Present  Aorta: 3.0 cm  Sinotubular Junction: 2.6 cm  Ascending Thoracic Aorta: 3.0 cm  Aortic Arch: 2.8 cm  Sinus of Valsalva Measurements:  Non-coronary: 28 mm  Right -coronary: 26 mm  Left -coronary: 27 mm  Coronary Artery Height above Annulus:  Left Main: 14.5 mm  Right Coronary: 14.8 mm  Virtual Basal Annulus Measurements:  Maximum/Minimum Diameter: 23.8 mm x 19 mm  Perimeter: 69 mm  Area: 373 mm2  Coronary Arteries: Suitable height above annulus Calcified LM at ostium without focal stenosis  Optimum Fluoroscopic Angle for Delivery: LAO 9 degrees Cranial 0 degrees  IMPRESSION: 1) Heavily calcified trileaflet aortic vale Annulus 373 mm2 suitable for 23 mm Sapien 3 valve. Small area of calcification at base of left leaflet that may increase  Risk of perivalvular regurgitation  2) Extensive mitral annular calcification anteriorly and posteriorly with no LAA thrombus  3) Suitable coronary height above annulus for TAVR  4) Small aortic root with severe LVH 17 mm which would likely imply need for aortic root enlargement for  traditional open AVR  5) Normal arch vessels with mild calcification of inferior surface of arch no contraindication to transfemoral delivery  6) Optimum angle for delivery LAO 9 degrees Cranial 0 degrees  7) PFO present  Charlton Haws   Electronically Signed  By: Charlton Haws M.D.  On: 05/27/2014 14:02      Study Result     EXAM: OVER-READ INTERPRETATION CT CHEST  The following report is an over-read performed by radiologist Dr.  Charline Bills of [facility], [place] on 05/27/2014. This over-read does not include interpretation of cardiac or coronary anatomy or pathology. The coronary CTA interpretation by the cardiologist is attached.  COMPARISON: CT chest dated 09/19/2013 and CT abdomen pelvis dated 02/23/2013  FINDINGS: 6 mm right lower lobe pulmonary nodule (series 512/image 50), unchanged from 2014. Additional 3 mm right lower lobe pulmonary nodule (series 512/image 50), unchanged from 2014.  4 mm subpleural right lower lobe pulmonary nodule (series 512/image 61), stable versus minimally increased from 2014.  Mild interstitial thickening to the lung apices and lung bases, without frank perihilar edema. Mild subpleural reticulation in the lingula. Mild dependent atelectasis in the bilateral lower lobes. No pleural effusion or pneumothorax.  Mild mediastinal/ hilar lymphadenopathy, including:  --10 mm short axis right paratracheal node (series 512/ image 27)  --10 mm short axis subcarinal node (series central/ image 34)  --11 mm short axis right hilar node (series 512/ image 34)  --8 mm short axis left hilar node (series 512/ image 39)  No focal osseous lesions.  IMPRESSION: Small right lower lobe nodules measuring up to 6 mm, grossly unchanged since 02/23/2013.  Small mediastinal lymph nodes measuring up to 11 mm, likely reactive.  Electronically Signed: By: Charline Bills M.D. On: 05/27/2014 11:27      CTA  ABDOMEN AND PELVIS WITH CONTRAST  TECHNIQUE: Multidetector CT imaging of the abdomen and pelvis was performed using the standard protocol during bolus administration of intravenous contrast. Multiplanar reconstructed images and MIPs were obtained and reviewed to evaluate the vascular anatomy.  CONTRAST: 80mL OMNIPAQUE IOHEXOL 350 MG/ML SOLN  COMPARISON: CT of the abdomen and pelvis 02/23/2013.  FINDINGS: CTA ABDOMEN AND PELVIS FINDINGS  Lower chest: Extensive calcifications of the mitral annulus. Small hiatal hernia.  Hepatobiliary: Sub cm low-attenuation lesion in segment 4A of the liver adjacent to the dome is too small to characterize, but unchanged compared to prior study 02/23/2013, presumably a small cyst. No other suspicious appearing hepatic lesions are noted. No intra or extrahepatic biliary ductal dilatation. Status post cholecystectomy.  Pancreas: Unremarkable.  Spleen: Unremarkable.  Adrenals/Urinary Tract: Bilateral adrenal glands and bilateral kidneys are normal in appearance. No hydroureteronephrosis. Urinary bladder is normal in appearance.  Stomach/Bowel: Normal appearance of the stomach. No pathologic dilatation of small bowel or colon. Normal appendix.  Vascular/Lymphatic: Extensive atherosclerosis throughout the abdominal and pelvic vasculature, with the measurements and findings pertinent to potential TAVR procedure, as detailed below. No evidence of aneurysm or dissection. No lymphadenopathy noted in the abdomen or pelvis.  Reproductive: Uterus and ovaries are atrophic.  Other: Small left inguinal hernia containing only fat incidentally noted. No significant volume of ascites. No pneumoperitoneum.  Musculoskeletal: Tarlov cysts incidentally noted in the sacrum. There are no aggressive appearing lytic or blastic lesions noted in the visualized portions of the skeleton.  VASCULAR MEASUREMENTS PERTINENT TO  TAVR:  AORTA:  Minimal Aortic Diameter - 12 x 12 mm  Severity of Aortic Calcification - moderate to severe  RIGHT PELVIS:  Right Common Iliac Artery -  Minimal Diameter - 8.6 x 9.2 mm  Tortuosity - Mild to moderate  Calcification - Moderate  Right External Iliac Artery -  Minimal Diameter - 7.9 x 7.8 mm  Tortuosity - Moderate  Calcification - Mild  Right Common Femoral Artery -  Minimal Diameter - 8.0 x 9.1 mm  Tortuosity - Mild  Calcification - Mild  LEFT PELVIS:  Left Common Iliac Artery -  Minimal Diameter - 9.6 x 8.9 mm  Tortuosity - Mild to moderate  Calcification - Moderate  Left External Iliac Artery -  Minimal Diameter - 8.5 x 8.4 mm  Tortuosity - Moderate to severe  Calcification - Mild  Left Common Femoral Artery -  Minimal Diameter - 7.6 x 8.6 mm  Tortuosity - Mild  Calcification - Mild  Review of the MIP images confirms the above findings.  IMPRESSION: 1. Findings and measurements pertinent to potential TAVR procedure, as detailed above. This patient does appear to have suitable pelvic arterial access bilaterally. 2. Additional incidental findings, as above.   Electronically Signed  By: Trudie Reed M.D.  On: 05/28/2014 15:22   STS Risk Calculator  Procedure    AVR  Risk of Mortality   2.3% Morbidity or Mortality  13.1% Prolonged LOS   5.6% Short LOS    34.5% Permanent Stroke   1.6% Prolonged Vent Support  7.9% DSW Infection    0.3% Renal Failure    3.6% Reoperation    6.0%    Impression:  Patient has stage D severe symptomatic aortic stenosis. I have personally reviewed the patient's recent transthoracic and transesophageal echocardiograms, diagnostic cardiac catheterization, and CT angiograms.  She clearly has critical aortic stenosis with heavy calcification and severely limited leaflet mobility involving all 3 leaflets of the aortic valve.  Left ventricular systolic  function remains normal although the patient has severe left ventricular hypertrophy with significant diastolic dysfunction. However, there was no evidence of significant dynamic left ventricular outflow tract obstruction by either catheterization or transesophageal echocardiogram. Based upon traditional risk models such as the STS calculator, risks associated with conventional surgical aortic valve replacement would only be moderately elevated because of the patient's age. However, the patient has very small size aortic root which would mandate the need for aortic root enlargement or root replacement.  Under the circumstances, I feel that risks associated with conventional surgery would be considerably higher. CT angiography demonstrates anatomical findings suitable for transcatheter aortic valve replacement from a transfemoral approach with expected low perioperative risk and no significant complicating features. I feel that transcatheter aortic valve replacement would be a reasonable alternative to high-risk conventional surgery.   Plan:  The patient and her friend were counseled at length regarding treatment alternatives for management of severe symptomatic aortic stenosis. Alternative approaches such as conventional aortic valve replacement, transcatheter aortic valve replacement, and palliative medical therapy were compared and contrasted at length.  The risks associated with conventional surgical aortic valve replacement were been discussed in detail, as were expectations for post-operative convalescence.  Long-term prognosis with medical therapy was discussed. This discussion was placed in the context of the patient's own specific clinical presentation and past medical history.  All of their questions been addressed.  The patient desires to proceed with transcatheter aortic valve replacement as an alternative to high-risk conventional surgery in the near future.  Following the decision to proceed with  transcatheter aortic valve replacement, a discussion has been held regarding what types of management strategies would be attempted intraoperatively in the event of life-threatening complications, including whether or not the patient would be considered a candidate for the use of cardiopulmonary bypass and/or conversion to open sternotomy for attempted surgical intervention.  The patient has been advised of a variety of complications that might develop including but not limited to risks of death, stroke, paravalvular leak, aortic dissection or other major vascular complications, aortic annulus rupture, device embolization, cardiac rupture or perforation, mitral regurgitation, acute myocardial infarction, arrhythmia, heart block or bradycardia requiring  permanent pacemaker placement, congestive heart failure, respiratory failure, renal failure, pneumonia, infection, other late complications related to structural valve deterioration or migration, or other complications that might ultimately cause a temporary or permanent loss of functional independence or other long term morbidity.    The patient plans to discuss the timing of surgery with her family. She will call and make definitive plans within the next few days. All of her questions have been addressed.    Salvatore Decent. Cornelius Moras, MD 06/10/2014 3:56 PM

## 2014-06-12 ENCOUNTER — Other Ambulatory Visit: Payer: Self-pay | Admitting: *Deleted

## 2014-06-12 DIAGNOSIS — I350 Nonrheumatic aortic (valve) stenosis: Secondary | ICD-10-CM

## 2014-06-18 ENCOUNTER — Encounter (HOSPITAL_COMMUNITY): Payer: Self-pay

## 2014-06-18 ENCOUNTER — Encounter (HOSPITAL_COMMUNITY)
Admission: RE | Admit: 2014-06-18 | Discharge: 2014-06-18 | Disposition: A | Discharge status: Home / Self Care | Payer: Medicare Other | Source: Ambulatory Visit | Attending: Cardiovascular Disease | Admitting: Cardiovascular Disease

## 2014-06-18 VITALS — BP 139/63 | HR 74 | Temp 97.3°F | Resp 16 | Ht 64.5 in | Wt 184.5 lb

## 2014-06-18 DIAGNOSIS — I4510 Unspecified right bundle-branch block: Secondary | ICD-10-CM | POA: Insufficient documentation

## 2014-06-18 DIAGNOSIS — Z01811 Encounter for preprocedural respiratory examination: Secondary | ICD-10-CM | POA: Diagnosis not present

## 2014-06-18 DIAGNOSIS — Z01812 Encounter for preprocedural laboratory examination: Secondary | ICD-10-CM | POA: Insufficient documentation

## 2014-06-18 DIAGNOSIS — R9431 Abnormal electrocardiogram [ECG] [EKG]: Secondary | ICD-10-CM | POA: Insufficient documentation

## 2014-06-18 DIAGNOSIS — Z01818 Encounter for other preprocedural examination: Secondary | ICD-10-CM | POA: Diagnosis not present

## 2014-06-18 DIAGNOSIS — I350 Nonrheumatic aortic (valve) stenosis: Secondary | ICD-10-CM | POA: Diagnosis not present

## 2014-06-18 DIAGNOSIS — Z0183 Encounter for blood typing: Secondary | ICD-10-CM | POA: Diagnosis not present

## 2014-06-18 HISTORY — DX: Personal history of other infectious and parasitic diseases: Z86.19

## 2014-06-18 HISTORY — DX: Personal history of other diseases of the respiratory system: Z87.09

## 2014-06-18 HISTORY — DX: Nocturia: R35.1

## 2014-06-18 HISTORY — DX: Pain in unspecified joint: M25.50

## 2014-06-18 HISTORY — DX: Hyperlipidemia, unspecified: E78.5

## 2014-06-18 HISTORY — DX: Unspecified osteoarthritis, unspecified site: M19.90

## 2014-06-18 HISTORY — DX: Anemia, unspecified: D64.9

## 2014-06-18 LAB — COMPREHENSIVE METABOLIC PANEL
ALT: 15 U/L (ref 0–35)
AST: 20 U/L (ref 0–37)
Albumin: 4 g/dL (ref 3.5–5.2)
Alkaline Phosphatase: 74 U/L (ref 39–117)
Anion gap: 10 (ref 5–15)
BILIRUBIN TOTAL: 0.8 mg/dL (ref 0.3–1.2)
BUN: 15 mg/dL (ref 6–23)
CO2: 23 mmol/L (ref 19–32)
Calcium: 8.9 mg/dL (ref 8.4–10.5)
Chloride: 105 mmol/L (ref 96–112)
Creatinine, Ser: 1.01 mg/dL (ref 0.50–1.10)
GFR calc Af Amer: 60 mL/min — ABNORMAL LOW (ref >90)
GFR, EST NON AFRICAN AMERICAN: 52 mL/min — AB (ref >90)
GLUCOSE: 115 mg/dL — AB (ref 70–99)
Potassium: 4.1 mmol/L (ref 3.5–5.1)
SODIUM: 138 mmol/L (ref 135–145)
Total Protein: 6.7 g/dL (ref 6.0–8.3)

## 2014-06-18 LAB — CBC
HCT: 37.6 % (ref 36.0–46.0)
HEMOGLOBIN: 12.8 g/dL (ref 12.0–15.0)
MCH: 28.3 pg (ref 26.0–34.0)
MCHC: 34 g/dL (ref 30.0–36.0)
MCV: 83 fL (ref 78.0–100.0)
Platelets: 153 10*3/uL (ref 150–400)
RBC: 4.53 MIL/uL (ref 3.87–5.11)
RDW: 14.2 % (ref 11.5–15.5)
WBC: 5.5 10*3/uL (ref 4.0–10.5)

## 2014-06-18 LAB — URINALYSIS, ROUTINE W REFLEX MICROSCOPIC
Glucose, UA: NEGATIVE mg/dL
Hgb urine dipstick: NEGATIVE
KETONES UR: NEGATIVE mg/dL
LEUKOCYTES UA: NEGATIVE
NITRITE: NEGATIVE
PH: 5.5 (ref 5.0–8.0)
PROTEIN: NEGATIVE mg/dL
Specific Gravity, Urine: 1.029 (ref 1.005–1.030)
Urobilinogen, UA: 0.2 mg/dL (ref 0.0–1.0)

## 2014-06-18 LAB — BLOOD GAS, ARTERIAL
Acid-base deficit: 0.7 mmol/L (ref 0.0–2.0)
Bicarbonate: 23.3 mEq/L (ref 20.0–24.0)
Drawn by: 421801
FIO2: 0.21 %
O2 Saturation: 97.2 %
PCO2 ART: 37.6 mmHg (ref 35.0–45.0)
PH ART: 7.409 (ref 7.350–7.450)
Patient temperature: 98.6
TCO2: 24.5 mmol/L (ref 0–100)
pO2, Arterial: 92.2 mmHg (ref 80.0–100.0)

## 2014-06-18 LAB — PROTIME-INR
INR: 0.96 (ref 0.00–1.49)
Prothrombin Time: 12.9 seconds (ref 11.6–15.2)

## 2014-06-18 LAB — SURGICAL PCR SCREEN
MRSA, PCR: NEGATIVE
Staphylococcus aureus: POSITIVE — AB

## 2014-06-18 LAB — APTT: aPTT: 67 seconds — ABNORMAL HIGH (ref 24–37)

## 2014-06-18 MED ORDER — CHLORHEXIDINE GLUCONATE 4 % EX LIQD: 60.0000 mL | Freq: Once | CUTANEOUS | Status: DC

## 2014-06-18 MED ORDER — INSULIN REGULAR HUMAN 100 UNIT/ML IJ SOLN
INTRAMUSCULAR | Status: DC
  Filled 2014-06-18: qty 2.5

## 2014-06-18 MED ORDER — NOREPINEPHRINE BITARTRATE 1 MG/ML IV SOLN
0.0000 ug/min | INTRAVENOUS | Status: DC
  Filled 2014-06-18: qty 4

## 2014-06-18 MED ORDER — DEXTROSE 5 % IV SOLN
0.0000 ug/min | INTRAVENOUS | Status: DC
  Filled 2014-06-18: qty 4

## 2014-06-18 MED ORDER — DEXTROSE 5 % IV SOLN
1.5000 g | INTRAVENOUS | Status: DC
  Filled 2014-06-18: qty 1.5

## 2014-06-18 MED ORDER — POTASSIUM CHLORIDE 2 MEQ/ML IV SOLN
80.0000 meq | INTRAVENOUS | Status: DC
  Filled 2014-06-18: qty 40

## 2014-06-18 MED ORDER — SODIUM CHLORIDE 0.9 % IV SOLN
INTRAVENOUS | Status: DC
  Filled 2014-06-18: qty 30

## 2014-06-18 MED ORDER — VANCOMYCIN HCL 10 G IV SOLR
1250.0000 mg | INTRAVENOUS | Status: DC
  Filled 2014-06-18: qty 1250

## 2014-06-18 MED ORDER — CHLORHEXIDINE GLUCONATE 4 % EX LIQD: 30.0000 mL | CUTANEOUS | Status: DC

## 2014-06-18 MED ORDER — PHENYLEPHRINE HCL 10 MG/ML IJ SOLN
30.0000 ug/min | INTRAVENOUS | Status: DC
  Filled 2014-06-18: qty 2

## 2014-06-18 MED ORDER — MAGNESIUM SULFATE 50 % IJ SOLN
40.0000 meq | INTRAMUSCULAR | Status: DC
  Filled 2014-06-18: qty 10

## 2014-06-18 MED ORDER — DOPAMINE-DEXTROSE 3.2-5 MG/ML-% IV SOLN
0.0000 ug/kg/min | INTRAVENOUS | Status: DC
  Filled 2014-06-18: qty 250

## 2014-06-18 MED ORDER — DEXMEDETOMIDINE HCL IN NACL 400 MCG/100ML IV SOLN
0.1000 ug/kg/h | INTRAVENOUS | Status: DC
  Filled 2014-06-18: qty 100

## 2014-06-18 MED ORDER — NITROGLYCERIN IN D5W 200-5 MCG/ML-% IV SOLN
2.0000 ug/min | INTRAVENOUS | Status: DC
  Filled 2014-06-18: qty 250

## 2014-06-18 NOTE — Progress Notes (Addendum)
   Echo reports in epic from 2005/2015  Heart cath in epic from 2015  PFT in epic from 05-27-14  Cardiologist is Dr.Spencer Donnie Aho  Stress test done > 60yrs ago  Sees NP Marin Comment

## 2014-06-18 NOTE — Progress Notes (Signed)
Mupirocin script called into [facility] in [place]

## 2014-06-18 NOTE — Pre-Procedure Instructions (Signed)
Lori Gilbert  06/18/2014   Your procedure is scheduled on:  Fri, Feb 26 @ 7:30 AM  Report to [facility]  at 5:30 AM.  Call this number if you have problems the morning of surgery: [phone]   Remember:   Do not eat food or drink liquids after midnight.   Take these medicines the morning of surgery with A SIP OF WATER: Omeprazole(Prilosec),Diltiazem(Dilacor),and Zoloft(Sertraline)              Stop taking your Ibuprofen.No Goody's,BC's,Aleve,Aspirin,Fish Oil,or any Herbal Medications   Do not wear jewelry, make-up or nail polish.  Do not wear lotions, powders, or perfumes.  Do not shave 48 hours prior to surgery.   Do not bring valuables to the hospital.  [facility] is not responsible                  for any belongings or valuables.               Contacts, dentures or bridgework may not be worn into surgery.  Leave suitcase in the car. After surgery it may be brought to your room.  For patients admitted to the hospital, discharge time is determined by your                treatment team.                   Special Instructions:  [place] - Preparing for Surgery  Before surgery, you can play an important role.  Because skin is not sterile, your skin needs to be as free of germs as possible.  You can reduce the number of germs on you skin by washing with CHG (chlorahexidine gluconate) soap before surgery.  CHG is an antiseptic cleaner which kills germs and bonds with the skin to continue killing germs even after washing.  Please DO NOT use if you have an allergy to CHG or antibacterial soaps.  If your skin becomes reddened/irritated stop using the CHG and inform your nurse when you arrive at Short Stay.  Do not shave (including legs and underarms) for at least 48 hours prior to the first CHG shower.  You may shave your face.  Please follow these instructions carefully:   1.  Shower with CHG Soap the night before surgery and the                                 morning of Surgery.  2.  If you choose to wash your hair, wash your hair first as usual with your       normal shampoo.  3.  After you shampoo, rinse your hair and body thoroughly to remove the                      Shampoo.  4.  Use CHG as you would any other liquid soap.  You can apply chg directly       to the skin and wash gently with scrungie or a clean washcloth.  5.  Apply the CHG Soap to your body ONLY FROM THE NECK DOWN.        Do not use on open wounds or open sores.  Avoid contact with your eyes,       ears, mouth and genitals (private parts).  Wash genitals (private parts)       with your normal soap.  6.  Wash thoroughly, paying special attention to the area where your surgery        will be performed.  7.  Thoroughly rinse your body with warm water from the neck down.  8.  DO NOT shower/wash with your normal soap after using and rinsing off       the CHG Soap.  9.  Pat yourself dry with a clean towel.            10.  Wear clean pajamas.            11.  Place clean sheets on your bed the night of your first shower and do not        sleep with pets.  Day of Surgery  Do not apply any lotions/deoderants the morning of surgery.  Please wear clean clothes to the hospital/surgery center.     Please read over the following fact sheets that you were given: Pain Booklet, Coughing and Deep Breathing, Blood Transfusion Information, MRSA Information and Surgical Site Infection Prevention

## 2014-06-19 ENCOUNTER — Other Ambulatory Visit: Payer: Self-pay | Admitting: *Deleted

## 2014-06-19 LAB — HEMOGLOBIN A1C
Hgb A1c MFr Bld: 5.9 % — ABNORMAL HIGH (ref 4.8–5.6)
MEAN PLASMA GLUCOSE: 123 mg/dL

## 2014-06-20 ENCOUNTER — Other Ambulatory Visit: Payer: Self-pay | Admitting: *Deleted

## 2014-06-20 ENCOUNTER — Encounter (HOSPITAL_COMMUNITY): Admission: RE | Payer: Self-pay | Source: Ambulatory Visit

## 2014-06-20 ENCOUNTER — Inpatient Hospital Stay (HOSPITAL_COMMUNITY): Admission: RE | Admit: 2014-06-20 | Payer: Medicare Other | Source: Ambulatory Visit | Admitting: Cardiovascular Disease

## 2014-06-20 DIAGNOSIS — I350 Nonrheumatic aortic (valve) stenosis: Secondary | ICD-10-CM

## 2014-06-20 SURGERY — IMPLANTATION, AORTIC VALVE, TRANSCATHETER, FEMORAL APPROACH
Anesthesia: General | Site: Chest

## 2014-06-25 DIAGNOSIS — Z13820 Encounter for screening for osteoporosis: Secondary | ICD-10-CM | POA: Diagnosis not present

## 2014-06-25 DIAGNOSIS — Z780 Asymptomatic menopausal state: Secondary | ICD-10-CM | POA: Diagnosis not present

## 2014-06-26 ENCOUNTER — Encounter (HOSPITAL_COMMUNITY)
Admission: RE | Admit: 2014-06-26 | Discharge: 2014-06-26 | Disposition: A | Discharge status: Home / Self Care | Payer: Medicare Other | Source: Ambulatory Visit | Attending: Cardiovascular Disease | Admitting: Cardiovascular Disease

## 2014-06-26 ENCOUNTER — Encounter (HOSPITAL_COMMUNITY): Payer: Self-pay

## 2014-06-26 VITALS — BP 157/60 | HR 81 | Temp 98.0°F | Resp 18 | Ht 64.5 in | Wt 187.5 lb

## 2014-06-26 DIAGNOSIS — Q211 Atrial septal defect: Secondary | ICD-10-CM | POA: Diagnosis not present

## 2014-06-26 DIAGNOSIS — E119 Type 2 diabetes mellitus without complications: Secondary | ICD-10-CM | POA: Diagnosis not present

## 2014-06-26 DIAGNOSIS — Z01818 Encounter for other preprocedural examination: Secondary | ICD-10-CM | POA: Insufficient documentation

## 2014-06-26 DIAGNOSIS — I10 Essential (primary) hypertension: Secondary | ICD-10-CM | POA: Diagnosis not present

## 2014-06-26 DIAGNOSIS — I350 Nonrheumatic aortic (valve) stenosis: Secondary | ICD-10-CM | POA: Diagnosis not present

## 2014-06-26 DIAGNOSIS — Z0183 Encounter for blood typing: Secondary | ICD-10-CM | POA: Insufficient documentation

## 2014-06-26 DIAGNOSIS — I272 Other secondary pulmonary hypertension: Secondary | ICD-10-CM | POA: Diagnosis not present

## 2014-06-26 DIAGNOSIS — Z01812 Encounter for preprocedural laboratory examination: Secondary | ICD-10-CM | POA: Insufficient documentation

## 2014-06-26 LAB — BLOOD GAS, ARTERIAL
Acid-base deficit: 2 mmol/L (ref 0.0–2.0)
BICARBONATE: 22 meq/L (ref 20.0–24.0)
DRAWN BY: 428831
FIO2: 0.21 %
O2 SAT: 99.1 %
PCO2 ART: 35.7 mmHg (ref 35.0–45.0)
PO2 ART: 127 mmHg — AB (ref 80.0–100.0)
Patient temperature: 98.6
TCO2: 23.1 mmol/L (ref 0–100)
pH, Arterial: 7.406 (ref 7.350–7.450)

## 2014-06-26 LAB — COMPREHENSIVE METABOLIC PANEL
ALBUMIN: 3.6 g/dL (ref 3.5–5.2)
ALK PHOS: 72 U/L (ref 39–117)
ALT: 16 U/L (ref 0–35)
AST: 20 U/L (ref 0–37)
Anion gap: 7 (ref 5–15)
BILIRUBIN TOTAL: 0.7 mg/dL (ref 0.3–1.2)
BUN: 17 mg/dL (ref 6–23)
CO2: 25 mmol/L (ref 19–32)
Calcium: 8.7 mg/dL (ref 8.4–10.5)
Chloride: 107 mmol/L (ref 96–112)
Creatinine, Ser: 0.99 mg/dL (ref 0.50–1.10)
GFR calc Af Amer: 61 mL/min — ABNORMAL LOW (ref >90)
GFR calc non Af Amer: 53 mL/min — ABNORMAL LOW (ref >90)
Glucose, Bld: 134 mg/dL — ABNORMAL HIGH (ref 70–99)
Potassium: 3.9 mmol/L (ref 3.5–5.1)
Sodium: 139 mmol/L (ref 135–145)
Total Protein: 6.3 g/dL (ref 6.0–8.3)

## 2014-06-26 LAB — URINALYSIS, ROUTINE W REFLEX MICROSCOPIC
Bilirubin Urine: NEGATIVE
GLUCOSE, UA: NEGATIVE mg/dL
Hgb urine dipstick: NEGATIVE
Ketones, ur: NEGATIVE mg/dL
LEUKOCYTES UA: NEGATIVE
Nitrite: NEGATIVE
PH: 5.5 (ref 5.0–8.0)
Protein, ur: NEGATIVE mg/dL
Specific Gravity, Urine: 1.026 (ref 1.005–1.030)
Urobilinogen, UA: 0.2 mg/dL (ref 0.0–1.0)

## 2014-06-26 LAB — CBC
HCT: 35.7 % — ABNORMAL LOW (ref 36.0–46.0)
Hemoglobin: 12.5 g/dL (ref 12.0–15.0)
MCH: 29.1 pg (ref 26.0–34.0)
MCHC: 35 g/dL (ref 30.0–36.0)
MCV: 83.2 fL (ref 78.0–100.0)
PLATELETS: 148 10*3/uL — AB (ref 150–400)
RBC: 4.29 MIL/uL (ref 3.87–5.11)
RDW: 14.2 % (ref 11.5–15.5)
WBC: 4.6 10*3/uL (ref 4.0–10.5)

## 2014-06-26 LAB — PROTIME-INR
INR: 0.97 (ref 0.00–1.49)
PROTHROMBIN TIME: 13 s (ref 11.6–15.2)

## 2014-06-26 LAB — APTT: APTT: 67 s — AB (ref 24–37)

## 2014-06-26 LAB — TYPE AND SCREEN
ABO/RH(D): O NEG
Antibody Screen: NEGATIVE

## 2014-06-26 MED ORDER — CHLORHEXIDINE GLUCONATE 4 % EX LIQD: 60.0000 mL | Freq: Once | CUTANEOUS | Status: DC

## 2014-06-26 MED ORDER — CHLORHEXIDINE GLUCONATE 4 % EX LIQD: 30.0000 mL | CUTANEOUS | Status: DC

## 2014-06-26 NOTE — Pre-Procedure Instructions (Signed)
Lori Gilbert  06/26/2014   Your procedure is scheduled on:  Mon, Mar 7 @7 :30 AM  Report to [facility]  at 5:30 AM.  Call this number if you have problems the morning of surgery: [phone]   Remember:   Do not eat food or drink liquids after midnight.   Take these medicines the morning of surgery with A SIP OF WATER: Diltiazem(Dilacor),Omeprazole(Prilosec),and Zoloft(Sertraline)               No Goody's,BC's,Aleve,Aspirin,Ibuprofen,Fish Oil,or any Herbal Medications   Do not wear jewelry, make-up or nail polish.  Do not wear lotions, powders, or perfumes.   Do not shave 48 hours prior to surgery.   Do not bring valuables to the hospital.  [facility] is not responsible                  for any belongings or valuables.               Contacts, dentures or bridgework may not be worn into surgery.  Leave suitcase in the car. After surgery it may be brought to your room.  For patients admitted to the hospital, discharge time is determined by your                treatment team.               Special Instructions:  [place] - Preparing for Surgery  Before surgery, you can play an important role.  Because skin is not sterile, your skin needs to be as free of germs as possible.  You can reduce the number of germs on you skin by washing with CHG (chlorahexidine gluconate) soap before surgery.  CHG is an antiseptic cleaner which kills germs and bonds with the skin to continue killing germs even after washing.  Please DO NOT use if you have an allergy to CHG or antibacterial soaps.  If your skin becomes reddened/irritated stop using the CHG and inform your nurse when you arrive at Short Stay.  Do not shave (including legs and underarms) for at least 48 hours prior to the first CHG shower.  You may shave your face.  Please follow these instructions carefully:   1.  Shower with CHG Soap the night before surgery and the                                morning of Surgery.  2.  If  you choose to wash your hair, wash your hair first as usual with your       normal shampoo.  3.  After you shampoo, rinse your hair and body thoroughly to remove the                      Shampoo.  4.  Use CHG as you would any other liquid soap.  You can apply chg directly       to the skin and wash gently with scrungie or a clean washcloth.  5.  Apply the CHG Soap to your body ONLY FROM THE NECK DOWN.        Do not use on open wounds or open sores.  Avoid contact with your eyes,       ears, mouth and genitals (private parts).  Wash genitals (private parts)       with your normal soap.  6.  Wash thoroughly, paying  special attention to the area where your surgery        will be performed.  7.  Thoroughly rinse your body with warm water from the neck down.  8.  DO NOT shower/wash with your normal soap after using and rinsing off       the CHG Soap.  9.  Pat yourself dry with a clean towel.            10.  Wear clean pajamas.            11.  Place clean sheets on your bed the night of your first shower and do not        sleep with pets.  Day of Surgery  Do not apply any lotions/deoderants the morning of surgery.  Please wear clean clothes to the hospital/surgery center.     Please read over the following fact sheets that you were given: Pain Booklet, Coughing and Deep Breathing, Blood Transfusion Information, MRSA Information and Surgical Site Infection Prevention

## 2014-06-27 DIAGNOSIS — R05 Cough: Secondary | ICD-10-CM | POA: Diagnosis not present

## 2014-06-27 DIAGNOSIS — J069 Acute upper respiratory infection, unspecified: Secondary | ICD-10-CM | POA: Diagnosis not present

## 2014-06-27 LAB — HEMOGLOBIN A1C
Hgb A1c MFr Bld: 6 % — ABNORMAL HIGH (ref 4.8–5.6)
Mean Plasma Glucose: 126 mg/dL

## 2014-06-27 NOTE — Progress Notes (Signed)
Anesthesia Chart Review:  Patient is a 81 year old female scheduled for TAVR, transfemoral approach on 06/30/14 by Dr. Excell Seltzer. Apparently, surgery was initially scheduled for last week, but had to be rescheduled due to OR room maintenance.   History includes severe AS, DM2, HTN, possible hypertrophic obstructive CM, PFO, non-smoker.  Primary cardiologist is Dr. Donnie Aho.   05/27/14 Cardiac TAVR CT:  IMPRESSION: 1) Heavily calcified trileaflet aortic vale Annulus 373 mm2 suitable for 23 mm Sapien 3 valve. Small area of calcification at base of left leaflet that may increase Risk of perivalvular regurgitation 2) Extensive mitral annular calcification anteriorly and posteriorly with no LAA thrombus 3) Suitable coronary height above annulus for TAVR 4) Small aortic root with severe LVH 17 mm which would likely imply need for aortic root enlargement for traditional open AVR 5) Normal arch vessels with mild calcification of inferior surface of arch no contraindication to transfemoral delivery 6) Optimum angle for delivery LAO 9 degrees Cranial 0 degrees 7) PFO present  05/02/14 TEE:  Severe LVH 12mm EF 60%  No SAM or LVOT gradient Laminar flow below aortic valve Critical Aortic stenosis with severely calcified trileaflet valve mean gradient 92 mmHG Severe MAC with mild MR LAE PFO present Normal RA/RV No significant mural aortic debris for age No LAA thrombus No pericardial effusion Small aortic root  04/23/14 cardiac cath:  Final Conclusions:  1. Patent coronary arteries with mild to moderate distal left mainstem stenosis and minor irregularities of the LAD, left circumflex, and right coronary arteries 2. Severely calcified aortic valve with hemodynamic evidence of severe/critical aortic stenosis 3. Severe pulmonary hypertension likely related to LV diastolic dysfunction (large V waves in the PCWP pressure wave)  Preoperative CXR, EKG, and labs noted. PTT is elevated at 67. She is not on any  blood thinners by her med list.  PTT was also 67 when it was checked on 06/18/14. I called result to Darius Bump, RN. She will review with Dr. Excell Seltzer to ensure he feels results are acceptable for OR.  Velna Ochs [facility] Short Stay Center/Anesthesiology Phone [phone] 06/27/2014 10:05 AM

## 2014-06-29 MED ORDER — NITROGLYCERIN IN D5W 200-5 MCG/ML-% IV SOLN
2.0000 ug/min | INTRAVENOUS | Status: AC
  Administered 2014-06-30: 5 ug/min via INTRAVENOUS
  Filled 2014-06-29: qty 250

## 2014-06-29 MED ORDER — HEPARIN SODIUM (PORCINE) 1000 UNIT/ML IJ SOLN
INTRAMUSCULAR | Status: DC
  Filled 2014-06-29: qty 30

## 2014-06-29 MED ORDER — PHENYLEPHRINE HCL 10 MG/ML IJ SOLN
30.0000 ug/min | INTRAVENOUS | Status: AC
  Administered 2014-06-30: 20 ug/min via INTRAVENOUS
  Filled 2014-06-29: qty 2

## 2014-06-29 MED ORDER — DEXMEDETOMIDINE HCL IN NACL 400 MCG/100ML IV SOLN
0.1000 ug/kg/h | INTRAVENOUS | Status: AC
  Administered 2014-06-30: 0.2 ug/kg/h via INTRAVENOUS
  Filled 2014-06-29: qty 100

## 2014-06-29 MED ORDER — INSULIN REGULAR HUMAN 100 UNIT/ML IJ SOLN
INTRAMUSCULAR | Status: DC
  Filled 2014-06-29: qty 2.5

## 2014-06-29 MED ORDER — POTASSIUM CHLORIDE 2 MEQ/ML IV SOLN
80.0000 meq | INTRAVENOUS | Status: DC
  Filled 2014-06-29: qty 40

## 2014-06-29 MED ORDER — NOREPINEPHRINE BITARTRATE 1 MG/ML IV SOLN
0.0000 ug/min | INTRAVENOUS | Status: DC
  Filled 2014-06-29: qty 4

## 2014-06-29 MED ORDER — DEXTROSE 5 % IV SOLN
1.5000 g | INTRAVENOUS | Status: AC
  Administered 2014-06-30: 1.5 g via INTRAVENOUS
  Filled 2014-06-29 (×2): qty 1.5

## 2014-06-29 MED ORDER — SODIUM CHLORIDE 0.9 % IV SOLN: INTRAVENOUS | Status: DC

## 2014-06-29 MED ORDER — VANCOMYCIN HCL 10 G IV SOLR
1250.0000 mg | INTRAVENOUS | Status: AC
  Administered 2014-06-30: 1250 mg via INTRAVENOUS
  Filled 2014-06-29 (×2): qty 1250

## 2014-06-29 MED ORDER — MAGNESIUM SULFATE 50 % IJ SOLN
40.0000 meq | INTRAMUSCULAR | Status: DC
  Filled 2014-06-29: qty 10

## 2014-06-29 MED ORDER — DOPAMINE-DEXTROSE 3.2-5 MG/ML-% IV SOLN
0.0000 ug/kg/min | INTRAVENOUS | Status: DC
  Filled 2014-06-29: qty 250

## 2014-06-29 MED ORDER — EPINEPHRINE HCL 1 MG/ML IJ SOLN
0.0000 ug/min | INTRAMUSCULAR | Status: DC
  Filled 2014-06-29: qty 4

## 2014-06-29 MED ORDER — METOPROLOL TARTRATE 12.5 MG HALF TABLET
12.5000 mg | ORAL_TABLET | Freq: Once | ORAL | Status: AC
  Administered 2014-06-30: 12.5 mg via ORAL
  Filled 2014-06-29: qty 1

## 2014-06-29 NOTE — H&P (Signed)
[address]       [place] [address]             [phone]      Cardiothoracic Surgery Admission History and Physical   PCP is Marin Comment, FNP Primary Cardiologist: Viann Fish, MD Referring Provider is Micheline Chapman, MD  Chief Complaint  Patient presents with  . Severe Aortic Stenosis           HPI:  The patient is a 81 year old woman with aortic stenosis and hypertrophic cardiomyopathy that have been followed by Dr. Donnie Aho.  The patient presented with  3 months of shortness of breath with significant exertion. She is widowed for the past 3 years and lives alone and is sedentary. She denies symptoms with low level activity in her house but moderate activity like brisk walking, lifting or going up stairs will bring on dyspnea. Her echo was done by Dr. Donnie Aho on 02/10/2014. The aortic valve is severely stenotic, calcified and thickened with markedly restricted leaflet mobility. There is severe MAC and mild MR. There is severe concentric LVH with possible mid-cavitary obstruction when the septum and lateral wall meet. The EF is 65-70%. The mean AV gradient is 75 mm Hg and the peak is 127 mm Hg. The LVOT peak gradient was measured at 4.8 mm Hg. The AVA was 0.63 cm2 by Vmax. She underwent R and L heart cath by Dr. Excell Seltzer on 04/23/2014 which showed a peak to peak AV gradient of 113 mm Hg with a mean of 92 and an AVA of 0.45. There was severe pulmonary hypertension with large V waves of 36. There was some angulation at the distal LM and possibly some mild associated stenosis in some views but no other significant stenosis. The question was whether there was a distinct septal bulge that was causing the obstruction and contributing to the high gradient that could be resected.A TEEshowed severe AS with a mean gradient of 92 mm Hg. There is severe concentric LVH with an EF of 60% and no SAM or LVOT gradient. The aortic root appears small. I felt that she may  require a root replacement or enlargement if she had open AVR which would significantly increase the risk in this 81 year old patient. Her STS risk of mortality for open AVR is fairly low but I think her risk would be much higher if she required root enlargement or replacement. I thought that TAVR may be a reasonable alternative for her. Cardiac CT showed a heavily calcified trileaflet aortic valve with an annulus suitable for a 23 mm Sapien 3 valve. An abdominal and pelvic CTA showed suitable pelvic arterial access bilaterally for a transfemoral approach.   Past Medical History  Diagnosis Date  . Diabetes mellitus without complication   . Hypertension   . H/O hiatal hernia   . Anxiety   . Depression     Past Surgical History  Procedure Laterality Date  . Cholecystectomy    . Dilation and curettage of uterus    . Ankle fracture surgery Bilateral   . Wrist fracture surgery Left   . Carpal tunnel release Right 05/13/2013    Procedure: RIGHT CARPAL TUNNEL RELEASE; Surgeon: Thera Flake., MD; Location: [facility]; Service: Orthopedics; Laterality: Right;  . Left and right heart catheterization with coronary angiogram N/A 04/23/2014    Procedure: LEFT AND RIGHT HEART CATHETERIZATION WITH CORONARY ANGIOGRAM; Surgeon: Micheline Chapman, MD; Location: [facility]  CATH LAB; Service: Cardiovascular; Laterality: N/A;    History reviewed. No pertinent family history.  Social History History  Substance Use Topics  . Smoking status: Never Smoker   . Smokeless tobacco: Not on file  . Alcohol Use: No    Current Outpatient Prescriptions  Medication Sig Dispense Refill  . diltiazem (DILACOR XR) 240 MG 24 hr capsule Take 240 mg by mouth daily.    . furosemide (LASIX) 20 MG tablet Take 20 mg by mouth daily.     Marland Kitchen glimepiride (AMARYL) 1 MG tablet Take 1 mg by mouth daily with breakfast.    .  guaiFENesin-codeine (CHERATUSSIN AC) 100-10 MG/5ML syrup Take 5 mLs by mouth at bedtime.    . IRON PO Take 1 tablet by mouth daily.    Marland Kitchen levofloxacin (LEVAQUIN) 500 MG tablet Take 500 mg by mouth daily.    Marland Kitchen losartan (COZAAR) 100 MG tablet Take 100 mg by mouth daily.  3  . metFORMIN (GLUCOPHAGE) 500 MG tablet Take 500 mg by mouth 2 (two) times daily with a meal.     . omeprazole (PRILOSEC) 20 MG capsule Take 20 mg by mouth 2 (two) times daily before a meal.    . potassium chloride (MICRO-K) 10 MEQ CR capsule Take 10 mEq by mouth daily.  2  . pravastatin (PRAVACHOL) 40 MG tablet Take 40 mg by mouth daily.    . sertraline (ZOLOFT) 100 MG tablet Take 100 mg by mouth daily.    . traZODone (DESYREL) 50 MG tablet Take 50 mg by mouth at bedtime.      No current facility-administered medications for this visit.    Allergies  Allergen Reactions  . Codeine     hyper    Review of Systems  Constitutional: Negative for fever, activity change, appetite change and fatigue.  HENT: Negative.  Eyes: Negative.  Respiratory: Positive for cough and shortness of breath. Negative for chest tightness.  Cardiovascular: Positive for leg swelling. Negative for chest pain.  Gastrointestinal:   Hiatal hernia  Occasional swallowing difficulty  Occasional diarrhea  Endocrine: Negative.  Genitourinary: Negative.  Musculoskeletal: Negative.  Allergic/Immunologic: Negative.  Neurological: Negative for dizziness and syncope.  Hematological: Negative.  Psychiatric/Behavioral: Negative.    BP 115/62 mmHg  Pulse 79  Resp 16  Ht 5\' 1"  (1.549 m)  Wt 179 lb (81.194 kg)  BMI 33.84 kg/m2  SpO2 97% Physical Exam  Constitutional: She is oriented to person, place, and time.  Elderly woman in no distress  HENT:  Head: Normocephalic and atraumatic.  Mouth/Throat: Oropharynx is clear and moist.  Eyes: EOM are normal. Pupils are  equal, round, and reactive to light.  Neck: Normal range of motion. Neck supple. No thyromegaly present.  Transmitted murmur to both sides of the neck  Cardiovascular: Normal rate and regular rhythm.  Murmur heard. 3/6 harsh systolic murmur along RSB  Pulmonary/Chest: Effort normal and breath sounds normal. No respiratory distress. She has no rales.  Abdominal: Soft. Bowel sounds are normal. She exhibits no distension and no mass. There is no tenderness.  Musculoskeletal: Normal range of motion. She exhibits no edema or tenderness.  Lymphadenopathy:   She has no cervical adenopathy.  Neurological: She is alert and oriented to person, place, and time. She has normal strength. No cranial nerve deficit or sensory deficit.  Skin: Skin is warm and dry.  Psychiatric: She has a normal mood and affect.     Diagnostic Tests:  Cardiac Catheterization Procedure Note  Name: Loyal Wach  Meulemans MRN: [id number] DOB: 1932/09/14  Procedure: Right Heart Cath, Left Heart Cath, Selective Coronary Angiography, aortic root angiography  Indication: aortic stenosis. This 81 year-old woman has a history of hypertrophic cardiomyopathy and aortic stenosis. She has experienced progressive dyspnea with exertion and presents for right and left heart catheterization for further assessment of her coronary anatomy and hemodynamics.   Procedural Details: There was an indwelling IV in a right antecubital vein. Using normal sterile technique, the IV was changed out for a 5 Fr brachial sheath over a 0.018 inch wire. The right wrist was then prepped, draped, and anesthetized with 1% lidocaine. Using the modified Seldinger technique a 5/6 French Slender sheath was placed in the right radial artery. Intra-arterial verapamil was administered through the radial artery sheath. IV heparin was administered after a JR4 catheter was advanced into the central aorta. A Swan-Ganz catheter was used for the right heart catheterization. A  glidewire had to be used to access the subcllavian vein and central venous circulation. Standard protocol was followed for recording of right heart pressures and sampling of oxygen saturations. Fick cardiac output was calculated. Standard Judkins catheters were used for selective coronary angiography. The left coronary artery was difficult to fill with contrast. I changed out to a 5 [place] guide catheter to better visualize the coronaries. There was difficulty with catheter manipulation because of marked angulation between the innominate artery and ascending aorta. I was unable to cross the aortic valve with a pigtail catheter and wire. An AL-1 catheter and straight wire were utilized. Even with a wire at the apex of the left ventricle, I could not get a pigtail catheter across the aortic valve. An AL-1 catheter eventually was able to track over a straight tip wire into the LV. The catheter was flushed and a transaortic valve pullback was done. An aortic root angiogram was then performed with a pigtail catheter. There were no immediate procedural complications. The patient was transferred to the post catheterization recovery area for further monitoring.  Procedural Findings: Hemodynamics RA 5 RV 64/6 PA 63/24 with a mean of 43 PCWP A wave 24, V wave 36, mean 25 LV 244/20 AO 131/70 with a mean of 95  Oxygen saturations: PA 67 AO 97  Cardiac Output (Fick) 5.4  Cardiac Index (Fick) 2.9  Aortic valve hemodynamics: Peak to peak gradient 113 mmHg, mean gradient 92 mmHg, aortic valve area 0.45 cm.  Coronary angiography: Coronary dominance: right  Left mainstem: The left main is long. The vessel is widely patent. There is some angulation of the distal left main and probable mild associated stenosis of no more than 50 %. The left main is mildly calcified.  Left anterior descending (LAD): The LAD reaches the apex of the heart. The vessel is mildly calcified. The LAD is tortuous throughout  its midportion. There is 30-40 % stenosis in the proximal and mid vessel. The diagonal branches are widely patent.  Left circumflex (LCx): The left circumflex arises from the left main with marked angulation. The vessel is widely patent throughout. It supplies a single OM branch with no significant stenosis.  Right coronary artery (RCA): The RCA is a large, dominant vessel. There are diffuse irregularities proximally with approximately 20% stenosis. The mid and distal vessel are widely patent. The PDA branch is widely patent with marked tortuosity.  Aortic root angiography: There is severe aortic valve calcification with markedly restricted leaflet motion. There is no significant aortic valve insufficiency. The proximal aortic root appears normal in caliber. There  is very severe mitral annular calcification present.  Estimated Blood Loss: Minimal  Final Conclusions:  1. Patent coronary arteries with mild to moderate distal left mainstem stenosis and minor irregularities of the LAD, left circumflex, and right coronary arteries 2. Severely calcified aortic valve with hemodynamic evidence of severe/critical aortic stenosis 3. Severe pulmonary hypertension likely related to LV diastolic dysfunction (large V waves in the PCWP pressure wave)  [name] MD, [facility] 04/23/2014, 12:16 PM  ADDENDUM REPORT: 05/27/2014 14:02  CLINICAL DATA: Aortic stenosis  EXAM: Cardiac TAVR CT  TECHNIQUE: The patient was scanned on a Philips 256 scanner. A 120 kV retrospective scan was triggered in the descending thoracic aorta at 111 HU's. Gantry rotation speed was 270 msecs and collimation was .9 mm. No beta blockade or nitro were given. The 3D data set was reconstructed in 5% intervals of the R-R cycle. Systolic and diastolic phases were analyzed on a dedicated work station using MPR, MIP and VRT modes. The patient received 80 cc of contrast.  FINDINGS: Aortic Valve: Trileaflet Heavily calcified  non coronary cusp. Heavily calcified right coronary cusp Left cusp is calcified toward the base extending into the annulus. The aortic root is small. There is severe MAC both anteriorly and posteriorly  Aorta: Normal origin of arch vessel. Mild calcification of the inferior surface of arch. Mild mural descending aortic debris. No ulcers or mobile plaque  LAA: No thrombus  PFO: Present  Aorta: 3.0 cm  Sinotubular Junction: 2.6 cm  Ascending Thoracic Aorta: 3.0 cm  Aortic Arch: 2.8 cm  Sinus of Valsalva Measurements:  Non-coronary: 28 mm  Right -coronary: 26 mm  Left -coronary: 27 mm  Coronary Artery Height above Annulus:  Left Main: 14.5 mm  Right Coronary: 14.8 mm  Virtual Basal Annulus Measurements:  Maximum/Minimum Diameter: 23.8 mm x 19 mm  Perimeter: 69 mm  Area: 373 mm2  Coronary Arteries: Suitable height above annulus Calcified LM at ostium without focal stenosis  Optimum Fluoroscopic Angle for Delivery: LAO 9 degrees Cranial 0 degrees  IMPRESSION: 1) Heavily calcified trileaflet aortic vale Annulus 373 mm2 suitable for 23 mm Sapien 3 valve. Small area of calcification at base of left leaflet that may increase  Risk of perivalvular regurgitation  2) Extensive mitral annular calcification anteriorly and posteriorly with no LAA thrombus  3) Suitable coronary height above annulus for TAVR  4) Small aortic root with severe LVH 17 mm which would likely imply need for aortic root enlargement for traditional open AVR  5) Normal arch vessels with mild calcification of inferior surface of arch no contraindication to transfemoral delivery  6) Optimum angle for delivery LAO 9 degrees Cranial 0 degrees  7) PFO present  Charlton Haws   Electronically Signed  By: Charlton Haws M.D.  On: 05/27/2014 14:02      Study Result     EXAM: OVER-READ INTERPRETATION CT CHEST  The following report is  an over-read performed by radiologist Dr. Charline Bills of [facility] on 05/27/2014. This over-read does not include interpretation of cardiac or coronary anatomy or pathology. The coronary CTA interpretation by the cardiologist is attached.  COMPARISON: CT chest dated 09/19/2013 and CT abdomen pelvis dated 02/23/2013  FINDINGS: 6 mm right lower lobe pulmonary nodule (series 512/image 50), unchanged from 2014. Additional 3 mm right lower lobe pulmonary nodule (series 512/image 50), unchanged from 2014.  4 mm subpleural right lower lobe pulmonary nodule (series 512/image 61), stable versus minimally increased from 2014.  Mild interstitial thickening to  the lung apices and lung bases, without frank perihilar edema. Mild subpleural reticulation in the lingula. Mild dependent atelectasis in the bilateral lower lobes. No pleural effusion or pneumothorax.  Mild mediastinal/ hilar lymphadenopathy, including:  --10 mm short axis right paratracheal node (series 512/ image 27)  --10 mm short axis subcarinal node (series central/ image 34)  --11 mm short axis right hilar node (series 512/ image 34)  --8 mm short axis left hilar node (series 512/ image 39)  No focal osseous lesions.  IMPRESSION: Small right lower lobe nodules measuring up to 6 mm, grossly unchanged since 02/23/2013.  Small mediastinal lymph nodes measuring up to 11 mm, likely reactive.  Electronically Signed: By: Charline Bills M.D. On: 05/27/2014 11:27   CLINICAL DATA: 81 year old female with history of severe aortic stenosis. Preprocedural study prior to potential transcatheter aortic valve replacement (TAVR).  EXAM: CTA ABDOMEN AND PELVIS WITH CONTRAST  TECHNIQUE: Multidetector CT imaging of the abdomen and pelvis was performed using the standard protocol during bolus administration of intravenous contrast. Multiplanar reconstructed images and MIPs were obtained and  reviewed to evaluate the vascular anatomy.  CONTRAST: 80mL OMNIPAQUE IOHEXOL 350 MG/ML SOLN  COMPARISON: CT of the abdomen and pelvis 02/23/2013.  FINDINGS: CTA ABDOMEN AND PELVIS FINDINGS  Lower chest: Extensive calcifications of the mitral annulus. Small hiatal hernia.  Hepatobiliary: Sub cm low-attenuation lesion in segment 4A of the liver adjacent to the dome is too small to characterize, but unchanged compared to prior study 02/23/2013, presumably a small cyst. No other suspicious appearing hepatic lesions are noted. No intra or extrahepatic biliary ductal dilatation. Status post cholecystectomy.  Pancreas: Unremarkable.  Spleen: Unremarkable.  Adrenals/Urinary Tract: Bilateral adrenal glands and bilateral kidneys are normal in appearance. No hydroureteronephrosis. Urinary bladder is normal in appearance.  Stomach/Bowel: Normal appearance of the stomach. No pathologic dilatation of small bowel or colon. Normal appendix.  Vascular/Lymphatic: Extensive atherosclerosis throughout the abdominal and pelvic vasculature, with the measurements and findings pertinent to potential TAVR procedure, as detailed below. No evidence of aneurysm or dissection. No lymphadenopathy noted in the abdomen or pelvis.  Reproductive: Uterus and ovaries are atrophic.  Other: Small left inguinal hernia containing only fat incidentally noted. No significant volume of ascites. No pneumoperitoneum.  Musculoskeletal: Tarlov cysts incidentally noted in the sacrum. There are no aggressive appearing lytic or blastic lesions noted in the visualized portions of the skeleton.  VASCULAR MEASUREMENTS PERTINENT TO TAVR:  AORTA:  Minimal Aortic Diameter - 12 x 12 mm  Severity of Aortic Calcification - moderate to severe  RIGHT PELVIS:  Right Common Iliac Artery -  Minimal Diameter - 8.6 x 9.2 mm  Tortuosity - Mild to moderate  Calcification - Moderate  Right  External Iliac Artery -  Minimal Diameter - 7.9 x 7.8 mm  Tortuosity - Moderate  Calcification - Mild  Right Common Femoral Artery -  Minimal Diameter - 8.0 x 9.1 mm  Tortuosity - Mild  Calcification - Mild  LEFT PELVIS:  Left Common Iliac Artery -  Minimal Diameter - 9.6 x 8.9 mm  Tortuosity - Mild to moderate  Calcification - Moderate  Left External Iliac Artery -  Minimal Diameter - 8.5 x 8.4 mm  Tortuosity - Moderate to severe  Calcification - Mild  Left Common Femoral Artery -  Minimal Diameter - 7.6 x 8.6 mm  Tortuosity - Mild  Calcification - Mild  Review of the MIP images confirms the above findings.  IMPRESSION: 1. Findings and measurements pertinent to potential  TAVR procedure, as detailed above. This patient does appear to have suitable pelvic arterial access bilaterally. 2. Additional incidental findings, as above.   Electronically Signed  By: Trudie Reed M.D.  On: 05/28/2014 15:22  STS Risk Calculator  Procedure: AV Replacement  Risk of Mortality: 2.259%  Morbidity or Mortality: 13.135%  Long Length of Stay: 5.597%  Short Length of Stay: 34.487%  Permanent Stroke: 1.586%  Prolonged Ventilation: 7.882%  DSW Infection: 0.276%  Renal Failure: 3.594%  Reoperation: 5.953%     Impression:     She has severe symptomatic aortic stenosis with heavy calcification and severely limited leaflet mobility involving all 3 leaflets of the aortic valve. Left ventricular systolic function remains normal although the patient has severe left ventricular hypertrophy with significant diastolic dysfunction. However, there was no evidence of significant dynamic left ventricular outflow tract obstruction by either catheterization or transesophageal echocardiogram.  The patient has very small size aortic root which would probably require the need for aortic root enlargement or root replacement. Under the  circumstances, I feel that risks associated with conventional surgery would be considerably higher than that predicted by the STS risk calculator. CT angiography demonstrates anatomical findings suitable for transcatheter aortic valve replacement from a transfemoral approach with expected low perioperative risk and no significant complicating features. I feel that transcatheter aortic valve replacement is the best treatment option for her.  During the course of the patient's preoperative work up she has been evaluated comprehensively by a multidisciplinary team of specialists coordinated through the Multidisciplinary Heart Valve Clinic in the [facility].  She has been demonstrated to suffer from symptomatic severe aortic stenosis as noted above. The patient and her daughter have been counseled extensively as to the relative risks and benefits of all options for the treatment of severe aortic stenosis including long term medical therapy, conventional surgery for aortic valve replacement, and transcatheter aortic valve replacement.  The patient has been independently evaluated by two cardiac surgeons including myself and Dr. Cornelius Moras, and she is felt to be at high risk for conventional surgical aortic valve replacement. Based upon review of all of the patient's preoperative diagnostic tests they are felt to be candidate for transcatheter aortic valve replacement using the transfemoral approach as an alternative to high risk conventional surgery.    Following the decision to proceed with transcatheter aortic valve replacement, a discussion has been held regarding what types of management strategies would be attempted intraoperatively in the event of life-threatening complications, including whether or not the patient would be considered a candidate for the use of cardiopulmonary bypass and/or conversion to open sternotomy for attempted surgical intervention.  The patient has been advised of a  variety of complications that might develop including but not limited to risks of death, stroke, paravalvular leak, aortic dissection or other major vascular complications, aortic annulus rupture, device embolization, cardiac rupture or perforation, mitral regurgitation, acute myocardial infarction, arrhythmia, heart block or bradycardia requiring permanent pacemaker placement, congestive heart failure, respiratory failure, renal failure, pneumonia, infection, other late complications related to structural valve deterioration or migration, or other complications that might ultimately cause a temporary or permanent loss of functional independence or other long term morbidity.  The patient provides full informed consent for the procedure as described and all questions were answered.   Plan:  Transfemoral TAVR using a 23 mm Sapien 3 valve.

## 2014-06-30 ENCOUNTER — Inpatient Hospital Stay (HOSPITAL_COMMUNITY): Payer: Medicare Other | Admitting: Anesthesiology

## 2014-06-30 ENCOUNTER — Encounter (HOSPITAL_COMMUNITY): Payer: Self-pay | Admitting: *Deleted

## 2014-06-30 ENCOUNTER — Inpatient Hospital Stay (HOSPITAL_COMMUNITY)
Admission: RE | Admit: 2014-06-30 | Discharge: 2014-07-02 | DRG: 266 | Disposition: A | Discharge status: Home / Self Care | Payer: Medicare Other | Source: Ambulatory Visit | Attending: Cardiovascular Disease | Admitting: Cardiovascular Disease

## 2014-06-30 ENCOUNTER — Encounter (HOSPITAL_COMMUNITY)
Admission: RE | Disposition: A | Discharge status: Home / Self Care | Payer: Medicare Other | Source: Ambulatory Visit | Attending: Cardiovascular Disease

## 2014-06-30 ENCOUNTER — Inpatient Hospital Stay (HOSPITAL_COMMUNITY): Payer: Medicare Other

## 2014-06-30 DIAGNOSIS — I700 Atherosclerosis of aorta: Secondary | ICD-10-CM | POA: Diagnosis not present

## 2014-06-30 DIAGNOSIS — I5033 Acute on chronic diastolic (congestive) heart failure: Secondary | ICD-10-CM | POA: Diagnosis not present

## 2014-06-30 DIAGNOSIS — J9811 Atelectasis: Secondary | ICD-10-CM | POA: Diagnosis not present

## 2014-06-30 DIAGNOSIS — E119 Type 2 diabetes mellitus without complications: Secondary | ICD-10-CM

## 2014-06-30 DIAGNOSIS — I10 Essential (primary) hypertension: Secondary | ICD-10-CM | POA: Diagnosis not present

## 2014-06-30 DIAGNOSIS — Z885 Allergy status to narcotic agent status: Secondary | ICD-10-CM | POA: Diagnosis not present

## 2014-06-30 DIAGNOSIS — Z006 Encounter for examination for normal comparison and control in clinical research program: Secondary | ICD-10-CM | POA: Diagnosis not present

## 2014-06-30 DIAGNOSIS — I2510 Atherosclerotic heart disease of native coronary artery without angina pectoris: Secondary | ICD-10-CM | POA: Diagnosis present

## 2014-06-30 DIAGNOSIS — K219 Gastro-esophageal reflux disease without esophagitis: Secondary | ICD-10-CM | POA: Diagnosis present

## 2014-06-30 DIAGNOSIS — D6959 Other secondary thrombocytopenia: Secondary | ICD-10-CM | POA: Diagnosis not present

## 2014-06-30 DIAGNOSIS — F329 Major depressive disorder, single episode, unspecified: Secondary | ICD-10-CM | POA: Diagnosis present

## 2014-06-30 DIAGNOSIS — I350 Nonrheumatic aortic (valve) stenosis: Secondary | ICD-10-CM | POA: Diagnosis not present

## 2014-06-30 DIAGNOSIS — Z952 Presence of prosthetic heart valve: Secondary | ICD-10-CM

## 2014-06-30 DIAGNOSIS — Z6831 Body mass index (BMI) 31.0-31.9, adult: Secondary | ICD-10-CM | POA: Diagnosis not present

## 2014-06-30 DIAGNOSIS — F419 Anxiety disorder, unspecified: Secondary | ICD-10-CM | POA: Diagnosis present

## 2014-06-30 DIAGNOSIS — Z4682 Encounter for fitting and adjustment of non-vascular catheter: Secondary | ICD-10-CM | POA: Diagnosis not present

## 2014-06-30 DIAGNOSIS — Z79899 Other long term (current) drug therapy: Secondary | ICD-10-CM | POA: Diagnosis not present

## 2014-06-30 DIAGNOSIS — Z954 Presence of other heart-valve replacement: Secondary | ICD-10-CM | POA: Diagnosis not present

## 2014-06-30 DIAGNOSIS — E785 Hyperlipidemia, unspecified: Secondary | ICD-10-CM | POA: Diagnosis present

## 2014-06-30 DIAGNOSIS — I421 Obstructive hypertrophic cardiomyopathy: Secondary | ICD-10-CM | POA: Diagnosis present

## 2014-06-30 DIAGNOSIS — I771 Stricture of artery: Secondary | ICD-10-CM | POA: Diagnosis not present

## 2014-06-30 DIAGNOSIS — E669 Obesity, unspecified: Secondary | ICD-10-CM | POA: Diagnosis not present

## 2014-06-30 DIAGNOSIS — I422 Other hypertrophic cardiomyopathy: Secondary | ICD-10-CM | POA: Diagnosis present

## 2014-06-30 DIAGNOSIS — I272 Other secondary pulmonary hypertension: Secondary | ICD-10-CM | POA: Diagnosis not present

## 2014-06-30 DIAGNOSIS — D62 Acute posthemorrhagic anemia: Secondary | ICD-10-CM | POA: Diagnosis not present

## 2014-06-30 DIAGNOSIS — K449 Diaphragmatic hernia without obstruction or gangrene: Secondary | ICD-10-CM | POA: Diagnosis present

## 2014-06-30 DIAGNOSIS — M1990 Unspecified osteoarthritis, unspecified site: Secondary | ICD-10-CM | POA: Diagnosis not present

## 2014-06-30 DIAGNOSIS — Z9889 Other specified postprocedural states: Secondary | ICD-10-CM | POA: Diagnosis not present

## 2014-06-30 HISTORY — DX: Presence of prosthetic heart valve: Z95.2

## 2014-06-30 HISTORY — PX: TRANSCATHETER AORTIC VALVE REPLACEMENT, TRANSFEMORAL: SHX6400

## 2014-06-30 HISTORY — DX: Nonrheumatic aortic (valve) stenosis: I35.0

## 2014-06-30 HISTORY — PX: TEE WITHOUT CARDIOVERSION: SHX5443

## 2014-06-30 LAB — CREATININE, SERUM
Creatinine, Ser: 0.67 mg/dL (ref 0.50–1.10)
GFR calc Af Amer: >90 mL/min (ref >90)
GFR calc non Af Amer: 81 mL/min — ABNORMAL LOW (ref >90)

## 2014-06-30 LAB — POCT I-STAT 3, ART BLOOD GAS (G3+)
Acid-Base Excess: 2 mmol/L (ref 0.0–2.0)
Acid-base deficit: 2 mmol/L (ref 0.0–2.0)
Acid-base deficit: 2 mmol/L (ref 0.0–2.0)
BICARBONATE: 22.3 meq/L (ref 20.0–24.0)
BICARBONATE: 27.9 meq/L — AB (ref 20.0–24.0)
Bicarbonate: 25.6 mEq/L — ABNORMAL HIGH (ref 20.0–24.0)
O2 SAT: 99 %
O2 SAT: 95 %
O2 Saturation: 96 %
PH ART: 7.262 — AB (ref 7.350–7.450)
PH ART: 7.348 — AB (ref 7.350–7.450)
PO2 ART: 90 mmHg (ref 80.0–100.0)
Patient temperature: 36.2
TCO2: 23 mmol/L (ref 0–100)
TCO2: 27 mmol/L (ref 0–100)
TCO2: 29 mmol/L (ref 0–100)
pCO2 arterial: 37.1 mmHg (ref 35.0–45.0)
pCO2 arterial: 56.2 mmHg — ABNORMAL HIGH (ref 35.0–45.0)
pCO2 arterial: 50.7 mmHg — ABNORMAL HIGH (ref 35.0–45.0)
pH, Arterial: 7.386 (ref 7.350–7.450)
pO2, Arterial: 118 mmHg — ABNORMAL HIGH (ref 80.0–100.0)
pO2, Arterial: 84 mmHg (ref 80.0–100.0)

## 2014-06-30 LAB — POCT I-STAT, CHEM 8
BUN: 15 mg/dL (ref 6–23)
BUN: 12 mg/dL (ref 6–23)
BUN: 11 mg/dL (ref 6–23)
CALCIUM ION: 1.13 mmol/L (ref 1.13–1.30)
CALCIUM ION: 1.19 mmol/L (ref 1.13–1.30)
CHLORIDE: 106 mmol/L (ref 96–112)
Calcium, Ion: 1.2 mmol/L (ref 1.13–1.30)
Chloride: 105 mmol/L (ref 96–112)
Chloride: 106 mmol/L (ref 96–112)
Creatinine, Ser: 0.7 mg/dL (ref 0.50–1.10)
Creatinine, Ser: 0.7 mg/dL (ref 0.50–1.10)
Creatinine, Ser: 0.7 mg/dL (ref 0.50–1.10)
GLUCOSE: 181 mg/dL — AB (ref 70–99)
GLUCOSE: 110 mg/dL — AB (ref 70–99)
Glucose, Bld: 149 mg/dL — ABNORMAL HIGH (ref 70–99)
HCT: 32 % — ABNORMAL LOW (ref 36.0–46.0)
HCT: 29 % — ABNORMAL LOW (ref 36.0–46.0)
HCT: 27 % — ABNORMAL LOW (ref 36.0–46.0)
HEMOGLOBIN: 9.2 g/dL — AB (ref 12.0–15.0)
Hemoglobin: 10.9 g/dL — ABNORMAL LOW (ref 12.0–15.0)
Hemoglobin: 9.9 g/dL — ABNORMAL LOW (ref 12.0–15.0)
Potassium: 3.5 mmol/L (ref 3.5–5.1)
Potassium: 3.5 mmol/L (ref 3.5–5.1)
Potassium: 4.1 mmol/L (ref 3.5–5.1)
SODIUM: 142 mmol/L (ref 135–145)
Sodium: 141 mmol/L (ref 135–145)
Sodium: 141 mmol/L (ref 135–145)
TCO2: 22 mmol/L (ref 0–100)
TCO2: 20 mmol/L (ref 0–100)
TCO2: 22 mmol/L (ref 0–100)

## 2014-06-30 LAB — CBC
HCT: 28.8 % — ABNORMAL LOW (ref 36.0–46.0)
HCT: 27.5 % — ABNORMAL LOW (ref 36.0–46.0)
Hemoglobin: 9.8 g/dL — ABNORMAL LOW (ref 12.0–15.0)
Hemoglobin: 9.4 g/dL — ABNORMAL LOW (ref 12.0–15.0)
MCH: 29.2 pg (ref 26.0–34.0)
MCH: 28.7 pg (ref 26.0–34.0)
MCHC: 34 g/dL (ref 30.0–36.0)
MCHC: 34.2 g/dL (ref 30.0–36.0)
MCV: 85.7 fL (ref 78.0–100.0)
MCV: 84.1 fL (ref 78.0–100.0)
PLATELETS: 100 10*3/uL — AB (ref 150–400)
Platelets: 91 10*3/uL — ABNORMAL LOW (ref 150–400)
RBC: 3.36 MIL/uL — AB (ref 3.87–5.11)
RBC: 3.27 MIL/uL — ABNORMAL LOW (ref 3.87–5.11)
RDW: 14.1 % (ref 11.5–15.5)
RDW: 14.3 % (ref 11.5–15.5)
WBC: 4.7 10*3/uL (ref 4.0–10.5)
WBC: 3.8 10*3/uL — ABNORMAL LOW (ref 4.0–10.5)

## 2014-06-30 LAB — GLUCOSE, CAPILLARY
GLUCOSE-CAPILLARY: 163 mg/dL — AB (ref 70–99)
GLUCOSE-CAPILLARY: 72 mg/dL (ref 70–99)
Glucose-Capillary: 139 mg/dL — ABNORMAL HIGH (ref 70–99)
Glucose-Capillary: 116 mg/dL — ABNORMAL HIGH (ref 70–99)

## 2014-06-30 LAB — MAGNESIUM: MAGNESIUM: 2 mg/dL (ref 1.5–2.5)

## 2014-06-30 LAB — PROTIME-INR
INR: 1.14 (ref 0.00–1.49)
Prothrombin Time: 14.8 s (ref 11.6–15.2)

## 2014-06-30 LAB — POCT I-STAT 4, (NA,K, GLUC, HGB,HCT)
Glucose, Bld: 167 mg/dL — ABNORMAL HIGH (ref 70–99)
HEMATOCRIT: 29 % — AB (ref 36.0–46.0)
Hemoglobin: 9.9 g/dL — ABNORMAL LOW (ref 12.0–15.0)
Potassium: 3.6 mmol/L (ref 3.5–5.1)
SODIUM: 143 mmol/L (ref 135–145)

## 2014-06-30 LAB — PREPARE RBC (CROSSMATCH)

## 2014-06-30 LAB — APTT: aPTT: 58 s — ABNORMAL HIGH (ref 24–37)

## 2014-06-30 SURGERY — IMPLANTATION, AORTIC VALVE, TRANSCATHETER, FEMORAL APPROACH
Anesthesia: General | Site: Chest

## 2014-06-30 MED ORDER — MIDAZOLAM HCL 2 MG/2ML IJ SOLN
INTRAMUSCULAR | Status: AC
  Filled 2014-06-30: qty 2

## 2014-06-30 MED ORDER — LACTATED RINGERS IV SOLN
500.0000 mL | Freq: Once | INTRAVENOUS | Status: AC | PRN
Start: 2014-06-30 — End: 2014-06-30

## 2014-06-30 MED ORDER — ARTIFICIAL TEARS OP OINT
TOPICAL_OINTMENT | OPHTHALMIC | Status: DC | PRN
Start: 2014-06-30 — End: 2014-06-30
  Administered 2014-06-30: 1 via OPHTHALMIC

## 2014-06-30 MED ORDER — MIDAZOLAM HCL 5 MG/5ML IJ SOLN
INTRAMUSCULAR | Status: DC | PRN
  Administered 2014-06-30 (×2): 1 mg via INTRAVENOUS

## 2014-06-30 MED ORDER — LIDOCAINE HCL 4 % MT SOLN
OROMUCOSAL | Status: DC | PRN
  Administered 2014-06-30: 4 mL via TOPICAL

## 2014-06-30 MED ORDER — CLOPIDOGREL BISULFATE 75 MG PO TABS
75.0000 mg | ORAL_TABLET | Freq: Every day | ORAL | Status: DC
  Administered 2014-07-01 – 2014-07-02 (×2): 75 mg via ORAL
  Filled 2014-06-30 (×3): qty 1

## 2014-06-30 MED ORDER — FENTANYL CITRATE 0.05 MG/ML IJ SOLN
50.0000 ug | INTRAMUSCULAR | Status: DC | PRN
  Administered 2014-07-01: 50 ug via INTRAVENOUS
  Filled 2014-06-30: qty 2

## 2014-06-30 MED ORDER — ROCURONIUM BROMIDE 100 MG/10ML IV SOLN
INTRAVENOUS | Status: DC | PRN
  Administered 2014-06-30: 50 mg via INTRAVENOUS

## 2014-06-30 MED ORDER — PANTOPRAZOLE SODIUM 40 MG PO TBEC
40.0000 mg | DELAYED_RELEASE_TABLET | Freq: Every day | ORAL | Status: DC
Start: 2014-07-02 — End: 2014-07-02
  Administered 2014-07-02: 40 mg via ORAL
  Filled 2014-06-30: qty 1

## 2014-06-30 MED ORDER — TRAZODONE HCL 50 MG PO TABS
50.0000 mg | ORAL_TABLET | Freq: Every day | ORAL | Status: DC
  Administered 2014-06-30 – 2014-07-01 (×2): 50 mg via ORAL
  Filled 2014-06-30 (×3): qty 1

## 2014-06-30 MED ORDER — PROPOFOL 10 MG/ML IV BOLUS
INTRAVENOUS | Status: AC
  Filled 2014-06-30: qty 20

## 2014-06-30 MED ORDER — LIDOCAINE HCL (CARDIAC) 20 MG/ML IV SOLN
INTRAVENOUS | Status: DC | PRN
  Administered 2014-06-30: 60 mg via INTRAVENOUS

## 2014-06-30 MED ORDER — HEPARIN SODIUM (PORCINE) 1000 UNIT/ML IJ SOLN
INTRAMUSCULAR | Status: DC | PRN
  Administered 2014-06-30: 8000 [IU] via INTRAVENOUS

## 2014-06-30 MED ORDER — ACETAMINOPHEN 325 MG PO TABS
650.0000 mg | ORAL_TABLET | ORAL | Status: AC
  Administered 2014-06-30: 650 mg via ORAL
  Filled 2014-06-30: qty 2

## 2014-06-30 MED ORDER — POTASSIUM CHLORIDE 10 MEQ/50ML IV SOLN
10.0000 meq | INTRAVENOUS | Status: AC
  Administered 2014-06-30 (×2): 10 meq via INTRAVENOUS

## 2014-06-30 MED ORDER — GLYCOPYRROLATE 0.2 MG/ML IJ SOLN
INTRAMUSCULAR | Status: DC | PRN
  Administered 2014-06-30: 0.6 mg via INTRAVENOUS

## 2014-06-30 MED ORDER — NEOSTIGMINE METHYLSULFATE 10 MG/10ML IV SOLN
INTRAVENOUS | Status: DC | PRN
  Administered 2014-06-30: 4 mg via INTRAVENOUS

## 2014-06-30 MED ORDER — SODIUM CHLORIDE 0.9 % IJ SOLN: 3.0000 mL | INTRAMUSCULAR | Status: DC | PRN

## 2014-06-30 MED ORDER — ALBUMIN HUMAN 5 % IV SOLN
INTRAVENOUS | Status: DC | PRN
  Administered 2014-06-30 (×2): via INTRAVENOUS

## 2014-06-30 MED ORDER — PHENYLEPHRINE HCL 10 MG/ML IJ SOLN
0.0000 ug/min | INTRAVENOUS | Status: DC
  Administered 2014-06-30: 10 ug/min via INTRAVENOUS
  Filled 2014-06-30 (×2): qty 2

## 2014-06-30 MED ORDER — INSULIN REGULAR BOLUS VIA INFUSION
0.0000 [IU] | Freq: Three times a day (TID) | INTRAVENOUS | Status: DC
  Filled 2014-06-30: qty 10

## 2014-06-30 MED ORDER — EPHEDRINE SULFATE 50 MG/ML IJ SOLN
INTRAMUSCULAR | Status: DC | PRN
  Administered 2014-06-30 (×2): 5 mg via INTRAVENOUS

## 2014-06-30 MED ORDER — MIDAZOLAM HCL 2 MG/2ML IJ SOLN: 2.0000 mg | INTRAMUSCULAR | Status: DC | PRN

## 2014-06-30 MED ORDER — ASPIRIN 81 MG PO CHEW: 324.0000 mg | CHEWABLE_TABLET | Freq: Every day | ORAL | Status: DC

## 2014-06-30 MED ORDER — ACETAMINOPHEN 160 MG/5ML PO SOLN: 650.0000 mg | Freq: Once | ORAL | Status: DC

## 2014-06-30 MED ORDER — FENTANYL CITRATE 0.05 MG/ML IJ SOLN
INTRAMUSCULAR | Status: DC | PRN
  Administered 2014-06-30: 100 ug via INTRAVENOUS
  Administered 2014-06-30: 50 ug via INTRAVENOUS

## 2014-06-30 MED ORDER — METOPROLOL TARTRATE 1 MG/ML IV SOLN: 2.5000 mg | INTRAVENOUS | Status: DC | PRN

## 2014-06-30 MED ORDER — INSULIN ASPART 100 UNIT/ML ~~LOC~~ SOLN
0.0000 [IU] | SUBCUTANEOUS | Status: DC
  Administered 2014-06-30 (×2): 4 [IU] via SUBCUTANEOUS

## 2014-06-30 MED ORDER — CETYLPYRIDINIUM CHLORIDE 0.05 % MT LIQD
7.0000 mL | Freq: Two times a day (BID) | OROMUCOSAL | Status: DC
  Administered 2014-06-30 – 2014-07-02 (×4): 7 mL via OROMUCOSAL

## 2014-06-30 MED ORDER — PROTAMINE SULFATE 10 MG/ML IV SOLN
INTRAVENOUS | Status: DC | PRN
  Administered 2014-06-30 (×2): 10 mg via INTRAVENOUS
  Administered 2014-06-30 (×3): 20 mg via INTRAVENOUS

## 2014-06-30 MED ORDER — SODIUM CHLORIDE 0.9 % IV SOLN: 250.0000 mL | INTRAVENOUS | Status: DC | PRN

## 2014-06-30 MED ORDER — PROMETHAZINE HCL 25 MG/ML IJ SOLN: 6.2500 mg | INTRAMUSCULAR | Status: DC | PRN

## 2014-06-30 MED ORDER — ASPIRIN EC 325 MG PO TBEC
325.0000 mg | DELAYED_RELEASE_TABLET | Freq: Every day | ORAL | Status: DC
  Filled 2014-06-30: qty 1

## 2014-06-30 MED ORDER — METOPROLOL TARTRATE 12.5 MG HALF TABLET
12.5000 mg | ORAL_TABLET | Freq: Two times a day (BID) | ORAL | Status: DC
  Administered 2014-07-01 – 2014-07-02 (×3): 12.5 mg via ORAL
  Filled 2014-06-30 (×5): qty 1

## 2014-06-30 MED ORDER — PROPOFOL 10 MG/ML IV BOLUS
INTRAVENOUS | Status: DC | PRN
  Administered 2014-06-30: 80 mg via INTRAVENOUS

## 2014-06-30 MED ORDER — CEFUROXIME SODIUM 1.5 G IJ SOLR
1.5000 g | Freq: Two times a day (BID) | INTRAMUSCULAR | Status: AC
  Administered 2014-06-30 – 2014-07-02 (×4): 1.5 g via INTRAVENOUS
  Filled 2014-06-30 (×5): qty 1.5

## 2014-06-30 MED ORDER — BENZONATATE 100 MG PO CAPS
200.0000 mg | ORAL_CAPSULE | Freq: Three times a day (TID) | ORAL | Status: DC | PRN
  Filled 2014-06-30 (×2): qty 2

## 2014-06-30 MED ORDER — FAMOTIDINE IN NACL 20-0.9 MG/50ML-% IV SOLN
20.0000 mg | Freq: Two times a day (BID) | INTRAVENOUS | Status: AC
  Administered 2014-06-30: 20 mg via INTRAVENOUS

## 2014-06-30 MED ORDER — SODIUM CHLORIDE 0.9 % IV SOLN
1.0000 mL/kg/h | INTRAVENOUS | Status: AC
  Administered 2014-06-30: 1 mL/kg/h via INTRAVENOUS

## 2014-06-30 MED ORDER — SODIUM CHLORIDE 0.9 % IV SOLN
INTRAVENOUS | Status: DC
  Filled 2014-06-30: qty 2.5

## 2014-06-30 MED ORDER — ONDANSETRON HCL 4 MG/2ML IJ SOLN
INTRAMUSCULAR | Status: DC | PRN
  Administered 2014-06-30: 4 mg via INTRAVENOUS

## 2014-06-30 MED ORDER — METOPROLOL TARTRATE 25 MG/10 ML ORAL SUSPENSION
12.5000 mg | Freq: Two times a day (BID) | ORAL | Status: DC
  Filled 2014-06-30 (×5): qty 5

## 2014-06-30 MED ORDER — VANCOMYCIN HCL IN DEXTROSE 1-5 GM/200ML-% IV SOLN
1000.0000 mg | Freq: Once | INTRAVENOUS | Status: AC
  Administered 2014-06-30: 1000 mg via INTRAVENOUS
  Filled 2014-06-30: qty 200

## 2014-06-30 MED ORDER — ACETAMINOPHEN 500 MG PO TABS
1000.0000 mg | ORAL_TABLET | Freq: Four times a day (QID) | ORAL | Status: DC
  Administered 2014-06-30 – 2014-07-02 (×6): 1000 mg via ORAL
  Filled 2014-06-30 (×12): qty 2

## 2014-06-30 MED ORDER — PRAVASTATIN SODIUM 40 MG PO TABS
40.0000 mg | ORAL_TABLET | Freq: Every day | ORAL | Status: DC
  Administered 2014-06-30 – 2014-07-01 (×2): 40 mg via ORAL
  Filled 2014-06-30 (×3): qty 1

## 2014-06-30 MED ORDER — SERTRALINE HCL 100 MG PO TABS
100.0000 mg | ORAL_TABLET | Freq: Every day | ORAL | Status: DC
  Administered 2014-07-01 – 2014-07-02 (×2): 100 mg via ORAL
  Filled 2014-06-30 (×2): qty 1

## 2014-06-30 MED ORDER — HYDROMORPHONE HCL 1 MG/ML IJ SOLN: 0.2500 mg | INTRAMUSCULAR | Status: DC | PRN

## 2014-06-30 MED ORDER — LOSARTAN POTASSIUM 50 MG PO TABS
100.0000 mg | ORAL_TABLET | Freq: Every day | ORAL | Status: DC
  Administered 2014-07-01 – 2014-07-02 (×2): 100 mg via ORAL
  Filled 2014-06-30 (×3): qty 2

## 2014-06-30 MED ORDER — ACETAMINOPHEN 160 MG/5ML PO SOLN
1000.0000 mg | Freq: Four times a day (QID) | ORAL | Status: DC
  Filled 2014-06-30: qty 40

## 2014-06-30 MED ORDER — ACETAMINOPHEN 650 MG RE SUPP: 650.0000 mg | Freq: Once | RECTAL | Status: DC

## 2014-06-30 MED ORDER — LACTATED RINGERS IV SOLN
INTRAVENOUS | Status: DC | PRN
  Administered 2014-06-30: 07:00:00 via INTRAVENOUS

## 2014-06-30 MED ORDER — ONDANSETRON HCL 4 MG/2ML IJ SOLN
4.0000 mg | Freq: Four times a day (QID) | INTRAMUSCULAR | Status: DC | PRN
Start: 2014-06-30 — End: 2014-07-02

## 2014-06-30 MED ORDER — SODIUM CHLORIDE 0.9 % IJ SOLN
3.0000 mL | Freq: Two times a day (BID) | INTRAMUSCULAR | Status: DC
  Administered 2014-07-01: 3 mL via INTRAVENOUS

## 2014-06-30 MED ORDER — IODIXANOL 320 MG/ML IV SOLN
INTRAVENOUS | Status: DC | PRN
  Administered 2014-06-30: 91.5 mL via INTRAVENOUS

## 2014-06-30 MED ORDER — LACTATED RINGERS IV SOLN
INTRAVENOUS | Status: DC | PRN
  Administered 2014-06-30: 06:00:00 via INTRAVENOUS

## 2014-06-30 MED ORDER — FENTANYL CITRATE 0.05 MG/ML IJ SOLN
INTRAMUSCULAR | Status: AC
  Filled 2014-06-30: qty 5

## 2014-06-30 MED ORDER — NITROGLYCERIN IN D5W 200-5 MCG/ML-% IV SOLN: 0.0000 ug/min | INTRAVENOUS | Status: DC

## 2014-06-30 MED ORDER — SODIUM CHLORIDE 0.9 % IR SOLN
Status: DC | PRN
  Administered 2014-06-30: 500 mL

## 2014-06-30 MED ORDER — DEXMEDETOMIDINE HCL IN NACL 200 MCG/50ML IV SOLN: 0.1000 ug/kg/h | INTRAVENOUS | Status: DC

## 2014-06-30 SURGICAL SUPPLY — 95 items
ATTRACTOMAT 16X20 MAGNETIC DRP (DRAPES) IMPLANT
BAG BANDED W/RUBBER/TAPE 36X54 (MISCELLANEOUS) ×3 IMPLANT
BAG DECANTER FOR FLEXI CONT (MISCELLANEOUS) IMPLANT
BAG SNAP BAND KOVER 36X36 (MISCELLANEOUS) ×3 IMPLANT
BLADE 10 SAFETY STRL DISP (BLADE) ×3 IMPLANT
BLADE STERNUM SYSTEM 6 (BLADE) ×3 IMPLANT
BLADE SURG ROTATE 9660 (MISCELLANEOUS) IMPLANT
CABLE PACING FASLOC BIEGE (MISCELLANEOUS) IMPLANT
CABLE PACING FASLOC BLUE (MISCELLANEOUS) ×3 IMPLANT
CANISTER SUCTION 2500CC (MISCELLANEOUS) IMPLANT
CANNULA FEM VENOUS REMOTE 22FR (CANNULA) IMPLANT
CANNULA OPTISITE PERFUSION 16F (CANNULA) IMPLANT
CANNULA OPTISITE PERFUSION 18F (CANNULA) IMPLANT
CATH DIAG EXPO 6F AL2 (CATHETERS) ×3 IMPLANT
CATH DIAG EXPO 6F VENT PIG 145 (CATHETERS) ×6 IMPLANT
CATH S G BIP PACING (SET/KITS/TRAYS/PACK) ×9 IMPLANT
CATH SOFT-VU 4F 65 STRAIGHT (CATHETERS) ×2 IMPLANT
CATH SOFT-VU STRAIGHT 4F 65CM (CATHETERS) ×1
CLIP TI MEDIUM 24 (CLIP) ×3 IMPLANT
CLIP TI WIDE RED SMALL 24 (CLIP) ×3 IMPLANT
CONT SPEC 4OZ CLIKSEAL STRL BL (MISCELLANEOUS) ×9 IMPLANT
COVER DOME SNAP 22 D (MISCELLANEOUS) ×6 IMPLANT
COVER MAYO STAND STRL (DRAPES) ×9 IMPLANT
COVER TABLE BACK 60X90 (DRAPES) ×3 IMPLANT
CRADLE DONUT ADULT HEAD (MISCELLANEOUS) ×3 IMPLANT
DERMABOND ADVANCED (GAUZE/BANDAGES/DRESSINGS) ×1
DERMABOND ADVANCED .7 DNX12 (GAUZE/BANDAGES/DRESSINGS) ×2 IMPLANT
DRAPE INCISE IOBAN 66X45 STRL (DRAPES) IMPLANT
DRAPE SLUSH/WARMER DISC (DRAPES) ×3 IMPLANT
DRAPE TABLE COVER HEAVY DUTY (DRAPES) ×3 IMPLANT
DRSG TEGADERM 4X4.75 (GAUZE/BANDAGES/DRESSINGS) ×9 IMPLANT
ELECT REM PT RETURN 9FT ADLT (ELECTROSURGICAL) ×6
ELECTRODE REM PT RTRN 9FT ADLT (ELECTROSURGICAL) ×4 IMPLANT
FELT TEFLON 6X6 (MISCELLANEOUS) ×3 IMPLANT
FEMORAL VENOUS CANN RAP (CANNULA) IMPLANT
GAUZE SPONGE 4X4 12PLY STRL (GAUZE/BANDAGES/DRESSINGS) ×3 IMPLANT
GLOVE BIOGEL PI IND STRL 6.5 (GLOVE) ×8 IMPLANT
GLOVE BIOGEL PI INDICATOR 6.5 (GLOVE) ×4
GLOVE ECLIPSE 7.5 STRL STRAW (GLOVE) ×3 IMPLANT
GLOVE ECLIPSE 8.0 STRL XLNG CF (GLOVE) ×3 IMPLANT
GLOVE EUDERMIC 7 POWDERFREE (GLOVE) ×3 IMPLANT
GLOVE ORTHO TXT STRL SZ7.5 (GLOVE) ×3 IMPLANT
GOWN STRL REUS W/ TWL LRG LVL3 (GOWN DISPOSABLE) ×12 IMPLANT
GOWN STRL REUS W/ TWL XL LVL3 (GOWN DISPOSABLE) ×8 IMPLANT
GOWN STRL REUS W/TWL LRG LVL3 (GOWN DISPOSABLE) ×6
GOWN STRL REUS W/TWL XL LVL3 (GOWN DISPOSABLE) ×4
GUIDEWIRE SAF TJ AMPL .035X180 (WIRE) ×3 IMPLANT
GUIDEWIRE SAFE TJ AMPLATZ EXST (WIRE) ×6 IMPLANT
GUIDEWIRE STRAIGHT .035 260CM (WIRE) ×3 IMPLANT
INSERT FOGARTY SM (MISCELLANEOUS) ×6 IMPLANT
KIT BASIN OR (CUSTOM PROCEDURE TRAY) ×3 IMPLANT
KIT DILATOR VASC 18G NDL (KITS) IMPLANT
KIT HEART LEFT (KITS) ×3 IMPLANT
KIT ROOM TURNOVER OR (KITS) ×3 IMPLANT
KIT SUCTION CATH 14FR (SUCTIONS) ×6 IMPLANT
LIQUID BAND (GAUZE/BANDAGES/DRESSINGS) ×3 IMPLANT
NEEDLE PERC 18GX7CM (NEEDLE) ×3 IMPLANT
NS IRRIG 1000ML POUR BTL (IV SOLUTION) ×9 IMPLANT
PACK AORTA (CUSTOM PROCEDURE TRAY) ×3 IMPLANT
PAD ARMBOARD 7.5X6 YLW CONV (MISCELLANEOUS) ×6 IMPLANT
PAD ELECT DEFIB RADIOL ZOLL (MISCELLANEOUS) ×3 IMPLANT
PATCH TACHOSII LRG 9.5X4.8 (VASCULAR PRODUCTS) IMPLANT
SHEATH PINNACLE 6F 10CM (SHEATH) ×6 IMPLANT
SPONGE LAP 4X18 X RAY DECT (DISPOSABLE) ×3 IMPLANT
STOPCOCK MORSE 400PSI 3WAY (MISCELLANEOUS) ×12 IMPLANT
SUT ETHIBOND X763 2 0 SH 1 (SUTURE) ×3 IMPLANT
SUT GORETEX CV 4 TH 22 36 (SUTURE) ×3 IMPLANT
SUT GORETEX TH-18 36 INCH (SUTURE) ×6 IMPLANT
SUT MNCRL AB 3-0 PS2 18 (SUTURE) ×3 IMPLANT
SUT PROLENE 3 0 SH1 36 (SUTURE) IMPLANT
SUT PROLENE 4 0 RB 1 (SUTURE) ×1
SUT PROLENE 4-0 RB1 .5 CRCL 36 (SUTURE) ×2 IMPLANT
SUT PROLENE 5 0 C 1 36 (SUTURE) ×6 IMPLANT
SUT PROLENE 6 0 C 1 30 (SUTURE) ×6 IMPLANT
SUT SILK  1 MH (SUTURE) ×1
SUT SILK 1 MH (SUTURE) ×2 IMPLANT
SUT SILK 2 0 SH CR/8 (SUTURE) IMPLANT
SUT VIC AB 2-0 CT1 27 (SUTURE) ×1
SUT VIC AB 2-0 CT1 TAPERPNT 27 (SUTURE) ×2 IMPLANT
SUT VIC AB 2-0 CTX 36 (SUTURE) IMPLANT
SUT VIC AB 3-0 SH 8-18 (SUTURE) ×6 IMPLANT
SUT VIC AB 3-0 X1 27 (SUTURE) ×3 IMPLANT
SYR 30ML LL (SYRINGE) ×6 IMPLANT
SYR 50ML LL SCALE MARK (SYRINGE) ×3 IMPLANT
TAPE CLOTH SURG 4X10 WHT LF (GAUZE/BANDAGES/DRESSINGS) ×3 IMPLANT
TOWEL OR 17X26 10 PK STRL BLUE (TOWEL DISPOSABLE) ×6 IMPLANT
TRANSDUCER W/STOPCOCK (MISCELLANEOUS) ×6 IMPLANT
TRAY FOLEY IC TEMP SENS 14FR (CATHETERS) ×3 IMPLANT
TUBE SUCT INTRACARD DLP 20F (MISCELLANEOUS) IMPLANT
TUBING ART PRESS 72  MALE/FEM (TUBING) ×1
TUBING ART PRESS 72 MALE/FEM (TUBING) ×2 IMPLANT
TUBING HIGH PRESSURE 120CM (CONNECTOR) ×3 IMPLANT
VALVE HEART TRANSCATH SZ3 23MM (Prosthesis & Implant Heart) ×3 IMPLANT
WIRE .035 3MM-J 145CM (WIRE) ×3 IMPLANT
WIRE AMPLATZ SS-J .035X180CM (WIRE) ×3 IMPLANT

## 2014-06-30 NOTE — Interval H&P Note (Signed)
History and Physical Interval Note:  06/30/2014 7:13 AM  Lori Gilbert  has presented today for surgery, with the diagnosis of SEVERE AS  The various methods of treatment have been discussed with the patient and family. After consideration of risks, benefits and other options for treatment, the patient has consented to  Procedure(s): TRANSCATHETER AORTIC VALVE REPLACEMENT, TRANSFEMORAL (N/A) TRANSESOPHAGEAL ECHOCARDIOGRAM (TEE) (N/A) as a surgical intervention .  The patient's history has been reviewed, patient examined, no change in status, stable for surgery.  I have reviewed the patient's chart and labs.  Questions were answered to the patient's satisfaction.     Alleen Borne

## 2014-06-30 NOTE — Op Note (Signed)
CARDIOTHORACIC SURGERY OPERATIVE NOTE  Date of Procedure:  06/30/2014  Preoperative Diagnosis: Severe Aortic Stenosis   Postoperative Diagnosis: Same   Procedure:    Transcatheter Aortic Valve Replacement - Right Transfemoral Approach  Edwards Sapien 3 Transcatheter Heart Valve (size 23 mm, model # 9600TFX, serial # [id number])   Co-Surgeons:  Alleen Borne, MD and [name], MD  Assistants:   Salvatore Decent. Cornelius Moras, MD Anesthesiologist:             Quita Skye. Krista Blue, MD  Echocardiographer:  Lorne Skeens, MD  Pre-operative Echo Findings:  critical aortic stenosis  Hyperdynamic left ventricular systolic function with severe LVH  Mild MR  Post-operative Echo Findings:  mild paravalvular leak  Normal left ventricular systolic function  Mild unchanged MR     DETAILS OF THE OPERATIVE PROCEDURE  The majority of the procedure is documented separately in a procedure note by Dr. Excell Seltzer.   TRANSFEMORAL ACCESS:   A small incision is made in the right groin immediately over the common femoral artery. The subcutaneous tissues are divided with electrocautery and the anterior surface of the common femoral artery is identified. Sharp dissection is utilized to free up the artery proximally and distally and the vessel is encircled with a vessel loop.  A pair of CV-4 Gore-tex sutures are place as diamond-shaped purse-strings on the anterior surface of the femoral artery.  The patient is heparinized systemically and ACT verified > 250 seconds.  The common femoral artery is punctured using an  18 gauge needle and a soft J-tipped guidewire is passed into the common iliac artery under fluoroscopic guidance.  A 6 Fr straight diagnostic catheter is placed over the guidewire and the guidewire is removed.  An Amplatz super stiff guidewire is passed through the sheath into the descending thoracic aorta and the introducing diagnostic catheter is removed.  Serial dilators are passed over the guidewire  under continuous fluoroscopic guidance, making certain that each dilator passes easily all of the way into the distal abdominal aorta.  A 14 Fr Edwards ESheath introducer sheath is passed over the guidewire into the abdominal aorta.  The introducing dilator is removed, the sheath is flushed with heparinized saline, and the sheath is secured to the skin.    FEMORAL SHEATH REMOVAL AND ARTERIAL CLOSURE:  After the completion of successful valve deployment as documented separately by Dr. Excell Seltzer, the femoral artery sheath is removed and the arteriotomy is closed using the previously placed Gore-tex purse-string sutures. Once the repair has been completed protamine was administered to reverse the anticoagulation.  The incision is irrigated with saline solution and subsequently closed in multiple layers using absorbable suture.  The skin incision is closed using a subcuticular skin closure.     Alleen Borne, MD 06/30/2014 1:45 PM

## 2014-06-30 NOTE — Op Note (Signed)
[facility]  TAVR OPERATIVE NOTE   Date of Procedure:  06/30/2014  Preoperative Diagnosis: Severe Aortic Stenosis   Postoperative Diagnosis: Same   Procedure:    Transcatheter Aortic Valve Replacement - Transfemoral Approach  Edwards Sapien 3 THV (size 23 mm, model # 9600TFX, serial # [id number])   Co-Surgeons:  Lori Croon, MD and Lori Bollman, MD  Assistants:   Lori Stalker, MD  Anesthesiologist:  Dr Lori Gilbert  Echocardiographer:  Dr Lori Gilbert  Pre-operative Echo Findings:  Critical aortic stenosis  Hyperdynamic left ventricular systolic function  Mild MR  Post-operative Echo Findings:  Mild paravalvular leak  Normal left ventricular systolic function  Mild (unchanged) MR  BRIEF CLINICAL NOTE AND INDICATIONS FOR SURGERY  The patient is a 81 year old woman with aortic stenosis and hypertrophic cardiomyopathy that have been followed by Dr. Donnie Gilbert. The patient presented with 3 months of shortness of breath with significant exertion. She is widowed for the past 3 years and lives alone and is sedentary. She denies symptoms with low level activity in her house but moderate activity like brisk walking, lifting or going up stairs will bring on dyspnea. Her echo was done by Dr. Donnie Gilbert on 02/10/2014. The aortic valve is severely stenotic, calcified and thickened with markedly restricted leaflet mobility. There is severe MAC and mild MR. There is severe concentric LVH with possible mid-cavitary obstruction when the septum and lateral wall meet. The EF is 65-70%. The mean AV gradient is 75 mm Hg and the peak is 127 mm Hg. The LVOT peak gradient was measured at 4.8 mm Hg. The AVA was 0.63 cm2 by Vmax. She underwent R and L heart cath on 04/23/2014 which showed a peak to peak AV gradient of 113 mm Hg with a mean of 92 and an AVA of 0.45. There was severe pulmonary hypertension with large V waves of 36. There was some angulation at the distal LM and possibly some mild  associated stenosis in some views but no other significant stenosis. The question was whether there was a distinct septal bulge that was causing the obstruction and contributing to the high gradient that could be resected.A TEEshowed severe AS with a mean gradient of 92 mm Hg. There is severe concentric LVH with an EF of 60% and no SAM or LVOT gradient. The aortic root appears small. She may require a root replacement or enlargement if she had open AVR which would significantly increase the risk in this 81 year old patient. Her STS risk of mortality for open AVR is fairly low but her risk would be much higher if she required root enlargement or replacement. TAVR may be a reasonable alternative for her. Cardiac CT showed a heavily calcified trileaflet aortic valve with an annulus suitable for a 23 mm Sapien 3 valve. An abdominal and pelvic CTA showed suitable pelvic arterial access bilaterally for a transfemoral approach.   During the course of the patient's preoperative work up they have been evaluated comprehensively by a multidisciplinary team of specialists coordinated through the [facility] in the [facility].  They have been demonstrated to suffer from symptomatic severe aortic stenosis as noted above. The patient has been counseled extensively as to the relative risks and benefits of all options for the treatment of severe aortic stenosis including long term medical therapy, conventional surgery for aortic valve replacement, and transcatheter aortic valve replacement.  The patient has been independently evaluated by two cardiac surgeons including Dr Lori Gilbert  and Dr. Laneta Gilbert, and they are felt to be at high risk for conventional surgical aortic valve replacement based upon a predicted risk of mortality using the Society of Thor[employer] Surgeons risk calculator of 2.3%. Both surgeons indicated the patient would be a poor candidate for conventional surgery  (predicted risk of mortality >15% and/or predicted risk of permanent morbidity >50%) because of comorbidities including limited functional capacity, advanced age, and small aortic root likely requiring root enlargement surgery.   Based upon review of all of the patient's preoperative diagnostic tests they are felt to be candidate for transcatheter aortic valve replacement using the transfemoral approach as an alternative to high risk conventional surgery.    Following the decision to proceed with transcatheter aortic valve replacement, a discussion has been held regarding what types of management strategies would be attempted intraoperatively in the event of life-threatening complications, including whether or not the patient would be considered a candidate for the use of cardiopulmonary bypass and/or conversion to open sternotomy for attempted surgical intervention.  The patient has been advised of a variety of complications that might develop peculiar to this approach including but not limited to risks of death, stroke, paravalvular leak, aortic dissection or other major vascular complications, aortic annulus rupture, device embolization, cardiac rupture or perforation, acute myocardial infarction, arrhythmia, heart block or bradycardia requiring permanent pacemaker placement, congestive heart failure, respiratory failure, renal failure, pneumonia, infection, other late complications related to structural valve deterioration or migration, or other complications that might ultimately cause a temporary or permanent loss of functional independence or other long term morbidity.  The patient provides full informed consent for the procedure as described and all questions were answered preoperatively.    DETAILS OF THE OPERATIVE PROCEDURE  PREPARATION:    The patient is brought to the operating room on the above mentioned date and central monitoring was established by the anesthesia team including placement of  Swan-Ganz catheter and radial arterial line. The patient is placed in the supine position on the operating table.  Intravenous antibiotics are administered. General endotracheal anesthesia is induced uneventfully. A Foley catheter is placed.  Baseline transesophageal echocardiogram was performed. The patient's chest, abdomen, both groins, and both lower extremities are prepared and draped in a sterile manner. A time out procedure is performed.   PERIPHERAL ACCESS:    Using the modified Seldinger technique, femoral arterial and venous access was obtained with placement of 6 Fr sheaths on the left side.  A pigtail diagnostic catheter was passed through the left femoral arterial sheath under fluoroscopic guidance into the aortic root.  A temporary transvenous pacemaker catheter was passed through the left femoral venous sheath under fluoroscopic guidance into the right ventricle.  The wire was difficult to advance into the RV apex and a second wire had to be used after the first wire became too soft. The pacemaker was tested to ensure stable lead placement and pacemaker capture. Aortic root angiography was performed in order to determine the optimal angiographic angle for valve deployment.   TRANSFEMORAL ACCESS:   A right femoral arterial cutdown was performed by Dr Lori Gilbert. Please Gilbert his separate operative note for details. The patient was heparinized systemically and ACT verified > 250 seconds.    A 14 Fr transfemoral E-sheath was introduced into the right femoral artery after progressively dilating over an Amplatz superstiff wire. An AL-2 catheter was used to direct a straight-tip exchange length wire across the native aortic valve into the left ventricle. This was exchanged out for  a pigtail catheter and position was confirmed in the LV apex. Simultaneous LV and Ao pressures were recorded.  The pigtail catheter was then exchanged for an Amplatz Extra-stiff wire in the LV apex. At that point, BAV was  performed using a 20 mm valvuloplasty balloon.  Once optimal position was achieved, BAV was done under rapid ventricular pacing at 180 bpm. The patient recovered well hemodynamically.   TRANSCATHETER HEART VALVE DEPLOYMENT:  An Edwards Sapien 3 THV (size 23 mm) was prepared and crimped per manufacturer's guidelines, and the proper orientation of the valve is confirmed on the [employer] delivery system. The valve was advanced through the introducer sheath using normal technique until in an appropriate position in the abdominal aorta beyond the sheath tip. The balloon was then retracted and using the fine-tuning wheel was centered on the valve. The valve was then advanced across the aortic arch using appropriate flexion of the catheter. The valve was carefully positioned across the aortic valve annulus. The Commander catheter was retracted using normal technique. Once final position of the valve has been confirmed by angiographic assessment, the valve is deployed while temporarily holding ventilation and during rapid ventricular pacing to maintain systolic blood pressure < 50 mmHg and pulse pressure < 10 mmHg. The balloon inflation is held for >3 seconds after reaching full deployment volume. Once the balloon has fully deflated the balloon is retracted into the ascending aorta and valve function is assessed using TEE. There is felt to be mild paravalvular leak and no central aortic insufficiency.  The patient's hemodynamic recovery following valve deployment is good.  The deployment balloon and guidewire are both removed. Echo demostrated acceptable post-procedural gradients, stable mitral valve function, and mild AI.   PROCEDURE COMPLETION:  The sheath was then removed and arteriotomy repaired by Dr Lori Gilbert. Please Gilbert his separate report for details.  Protamine was administered once femoral arterial repair was complete. The temporary pacemaker, pigtail catheters and femoral sheaths were removed with  manual pressure used for hemostasis.   The patient tolerated the procedure well and is transported to the surgical intensive care in stable condition. There were no immediate intraoperative complications. All sponge instrument and needle counts are verified correct at completion of the operation.   No blood products were administered during the operation.  The patient received a total of 92 mL of intravenous contrast during the procedure.  Lori Bollman MD 06/30/2014 10:56 AM

## 2014-06-30 NOTE — Addendum Note (Signed)
Addendum  created 06/30/14 1351 by Heather Roberts, MD   Modules edited: Anesthesia Events, Narrator   Narrator:  Narrator: Event Log Edited

## 2014-06-30 NOTE — Progress Notes (Signed)
Echocardiogram Echocardiogram Transesophageal has been performed.  Lori Gilbert 06/30/2014, 10:13 AM

## 2014-06-30 NOTE — Anesthesia Procedure Notes (Addendum)
Procedure Name: Intubation Date/Time: 06/30/2014 7:41 AM Performed by: Suzy Bouchard Pre-anesthesia Checklist: Patient identified, Emergency Drugs available, Suction available, Patient being monitored and Timeout performed Patient Re-evaluated:Patient Re-evaluated prior to inductionOxygen Delivery Method: Circle system utilized Preoxygenation: Pre-oxygenation with 100% oxygen Intubation Type: IV induction Ventilation: Mask ventilation without difficulty Laryngoscope Size: Miller and 2 Grade View: Grade I Tube type: Oral Tube size: 7.0 mm Number of attempts: 1 Airway Equipment and Method: Stylet and LTA kit utilized Secured at: 21 cm Tube secured with: Tape Dental Injury: Teeth and Oropharynx as per pre-operative assessment       The patient was identified and consent obtained.  TO was performed, and full barrier precautions were used.  The skin was anesthetized with lidocaine.  Once the vein was located with the 22 ga. needle using ultrasound guidance , the wire was inserted into the vein.  The wire location was confirmed with ultrasound.  The insertion site was dilated and the introducer was carefully inserted and sutured in place. The PAC was checked, and floated into the PA.  Once in the PA, the catheter was secured. The patient tolerated the procedure well.  CXR was ordered for PACU. Start: 8446 End: 0708 J. Tedra Senegal, MD

## 2014-06-30 NOTE — Anesthesia Postprocedure Evaluation (Signed)
  Anesthesia Post-op Note  Patient: Lori Gilbert  Procedure(s) Performed: Procedure(s): TRANSCATHETER AORTIC VALVE REPLACEMENT, TRANSFEMORAL (N/A) TRANSESOPHAGEAL ECHOCARDIOGRAM (TEE) (N/A)  Patient Location: SICU  Anesthesia Type:General  Level of Consciousness: awake and alert   Airway and Oxygen Therapy: Patient Spontanous Breathing and Patient connected to nasal cannula oxygen  Post-op Pain: none  Post-op Assessment: Post-op Vital signs reviewed, Patient's Cardiovascular Status Stable and Respiratory Function Stable  Post-op Vital Signs: Reviewed and stable  Last Vitals:  Filed Vitals:   06/30/14 1300  BP: 133/57  Pulse: 66  Temp: 36.4 C  Resp: 18    Complications: No apparent anesthesia complications

## 2014-06-30 NOTE — Anesthesia Preprocedure Evaluation (Addendum)
Anesthesia Evaluation  Patient identified by MRN, date of birth, ID band Patient awake    Reviewed: Allergy & Precautions, NPO status , Patient's Chart, lab work & pertinent test results  History of Anesthesia Complications Negative for: history of anesthetic complications  Airway Mallampati: II   Neck ROM: Full    Dental  (+) Teeth Intact, Dental Advisory Given   Pulmonary neg pulmonary ROS,    Pulmonary exam normal       Cardiovascular hypertension, + CAD + Valvular Problems/Murmurs Rhythm:Regular + Systolic murmurs 08/29/19 TEE:  Severe LVH 1mm EF 60%  No SAM or LVOT gradient Laminar flow below aortic valve Critical Aortic stenosis with severely calcified trileaflet valve mean gradient 92 mmHG Severe MAC with mild MR LAE PFO present  Cath 04/23/2014 Final Conclusions:  1. Patent coronary arteries with mild to moderate distal left mainstem stenosis and minor irregularities of the LAD, left circumflex, and right coronary arteries 2. Severely calcified aortic valve with hemodynamic evidence of severe/critical aortic stenosis 3. Severe pulmonary hypertension likely related to LV diastolic dysfunction (large V waves in the PCWP pressure wave)  [name] MD, [facility] 04/23/2014, 12:16 PM    Neuro/Psych PSYCHIATRIC DISORDERS Anxiety Depression negative neurological ROS     GI/Hepatic Neg liver ROS, hiatal hernia, GERD-  ,  Endo/Other  diabetes  Renal/GU negative Renal ROS     Musculoskeletal   Abdominal   Peds  Hematology   Anesthesia Other Findings   Reproductive/Obstetrics                          Anesthesia Physical Anesthesia Plan  ASA: IV  Anesthesia Plan: General   Post-op Pain Management:    Induction: Intravenous  Airway Management Planned: Oral ETT  Additional Equipment: Arterial line, TEE and PA Cath  Intra-op Plan:   Post-operative Plan: Possible Post-op  intubation/ventilation  Informed Consent: I have reviewed the patients History and Physical, chart, labs and discussed the procedure including the risks, benefits and alternatives for the proposed anesthesia with the patient or authorized representative who has indicated his/her understanding and acceptance.   Dental advisory given  Plan Discussed with: CRNA, Anesthesiologist and Surgeon  Anesthesia Plan Comments:        Anesthesia Quick Evaluation

## 2014-06-30 NOTE — OR Nursing (Signed)
Bilateral pedal pulses present.

## 2014-06-30 NOTE — Transfer of Care (Signed)
Immediate Anesthesia Transfer of Care Note  Patient: NICHA ZORA  Procedure(s) Performed: Procedure(s): TRANSCATHETER AORTIC VALVE REPLACEMENT, TRANSFEMORAL (N/A) TRANSESOPHAGEAL ECHOCARDIOGRAM (TEE) (N/A)  Patient Location: SICU  Anesthesia Type:General  Level of Consciousness: awake and alert   Airway & Oxygen Therapy: Patient Spontanous Breathing and Patient connected to nasal cannula oxygen  Post-op Assessment: Report given to RN, Post -op Vital signs reviewed and stable and Patient moving all extremities  Post vital signs: Reviewed and stable  Last Vitals:  Filed Vitals:   06/30/14 0920  BP:   Pulse: 59  Temp:   Resp:     Complications: No apparent anesthesia complications

## 2014-06-30 NOTE — Progress Notes (Signed)
S/p TAVR  Extubated  Comfortable  BP 118/54 mmHg  Pulse 61  Temp(Src) 98.1 F (36.7 C) (Oral)  Resp 23  Wt 199 lb 3.2 oz (90.357 kg)  SpO2 96%   Intake/Output Summary (Last 24 hours) at 06/30/14 1758 Last data filed at 06/30/14 1600  Gross per 24 hour  Intake 2079.54 ml  Output   1280 ml  Net 799.54 ml    Doing well early postop

## 2014-06-30 NOTE — Progress Notes (Signed)
Friday pt. Saw primary MD. Pt. Having cough,sinus drainage. Doctor put pt. On  A Z-pack. No fever noted per pt. States she feels fine.

## 2014-07-01 ENCOUNTER — Other Ambulatory Visit: Payer: Self-pay

## 2014-07-01 ENCOUNTER — Encounter (HOSPITAL_COMMUNITY): Payer: Self-pay | Admitting: Cardiovascular Disease

## 2014-07-01 ENCOUNTER — Inpatient Hospital Stay (HOSPITAL_COMMUNITY): Payer: Medicare Other

## 2014-07-01 DIAGNOSIS — I350 Nonrheumatic aortic (valve) stenosis: Principal | ICD-10-CM

## 2014-07-01 DIAGNOSIS — Z954 Presence of other heart-valve replacement: Secondary | ICD-10-CM

## 2014-07-01 LAB — CBC
HEMATOCRIT: 26.8 % — AB (ref 36.0–46.0)
Hemoglobin: 8.8 g/dL — ABNORMAL LOW (ref 12.0–15.0)
MCH: 28.1 pg (ref 26.0–34.0)
MCHC: 32.8 g/dL (ref 30.0–36.0)
MCV: 85.6 fL (ref 78.0–100.0)
Platelets: 94 10*3/uL — ABNORMAL LOW (ref 150–400)
RBC: 3.13 MIL/uL — ABNORMAL LOW (ref 3.87–5.11)
RDW: 14.5 % (ref 11.5–15.5)
WBC: 3.6 10*3/uL — AB (ref 4.0–10.5)

## 2014-07-01 LAB — GLUCOSE, CAPILLARY
GLUCOSE-CAPILLARY: 109 mg/dL — AB (ref 70–99)
GLUCOSE-CAPILLARY: 123 mg/dL — AB (ref 70–99)
GLUCOSE-CAPILLARY: 93 mg/dL (ref 70–99)
Glucose-Capillary: 109 mg/dL — ABNORMAL HIGH (ref 70–99)
Glucose-Capillary: 93 mg/dL (ref 70–99)

## 2014-07-01 LAB — BASIC METABOLIC PANEL
Anion gap: 5 (ref 5–15)
BUN: 8 mg/dL (ref 6–23)
CHLORIDE: 108 mmol/L (ref 96–112)
CO2: 28 mmol/L (ref 19–32)
Calcium: 7.9 mg/dL — ABNORMAL LOW (ref 8.4–10.5)
Creatinine, Ser: 0.76 mg/dL (ref 0.50–1.10)
GFR calc non Af Amer: 78 mL/min — ABNORMAL LOW (ref >90)
Glucose, Bld: 147 mg/dL — ABNORMAL HIGH (ref 70–99)
POTASSIUM: 3.8 mmol/L (ref 3.5–5.1)
SODIUM: 141 mmol/L (ref 135–145)

## 2014-07-01 LAB — MAGNESIUM: Magnesium: 2 mg/dL (ref 1.5–2.5)

## 2014-07-01 MED ORDER — GLIMEPIRIDE 1 MG PO TABS
1.0000 mg | ORAL_TABLET | Freq: Every day | ORAL | Status: DC
  Administered 2014-07-01 – 2014-07-02 (×2): 1 mg via ORAL
  Filled 2014-07-01 (×4): qty 1

## 2014-07-01 MED ORDER — FUROSEMIDE 20 MG PO TABS
20.0000 mg | ORAL_TABLET | Freq: Every day | ORAL | Status: DC
  Administered 2014-07-01: 20 mg via ORAL
  Filled 2014-07-01 (×2): qty 1

## 2014-07-01 MED ORDER — CHLORHEXIDINE GLUCONATE CLOTH 2 % EX PADS
6.0000 | MEDICATED_PAD | Freq: Every day | CUTANEOUS | Status: DC
  Administered 2014-07-02: 6 via TOPICAL

## 2014-07-01 MED ORDER — INSULIN ASPART 100 UNIT/ML ~~LOC~~ SOLN
0.0000 [IU] | Freq: Three times a day (TID) | SUBCUTANEOUS | Status: DC
Start: 2014-07-01 — End: 2014-07-02

## 2014-07-01 MED ORDER — MUPIROCIN 2 % EX OINT
1.0000 "application " | TOPICAL_OINTMENT | Freq: Two times a day (BID) | CUTANEOUS | Status: DC
  Administered 2014-07-01 – 2014-07-02 (×3): 1 via NASAL
  Filled 2014-07-01: qty 22

## 2014-07-01 MED ORDER — INSULIN ASPART 100 UNIT/ML ~~LOC~~ SOLN: 0.0000 [IU] | Freq: Every day | SUBCUTANEOUS | Status: DC

## 2014-07-01 MED ORDER — ASPIRIN 81 MG PO CHEW
81.0000 mg | CHEWABLE_TABLET | Freq: Every day | ORAL | Status: DC
  Administered 2014-07-01 – 2014-07-02 (×2): 81 mg via ORAL
  Filled 2014-07-01 (×2): qty 1

## 2014-07-01 MED ORDER — POTASSIUM CHLORIDE CRYS ER 10 MEQ PO TBCR
10.0000 meq | EXTENDED_RELEASE_TABLET | Freq: Every day | ORAL | Status: DC
  Administered 2014-07-01 – 2014-07-02 (×2): 10 meq via ORAL
  Filled 2014-07-01 (×2): qty 1

## 2014-07-01 MED ORDER — DILTIAZEM HCL ER 240 MG PO CP24
240.0000 mg | ORAL_CAPSULE | Freq: Every day | ORAL | Status: DC
  Administered 2014-07-01 – 2014-07-02 (×2): 240 mg via ORAL
  Filled 2014-07-01 (×3): qty 1

## 2014-07-01 NOTE — Progress Notes (Signed)
CARDIAC REHAB PHASE I   PRE:  Rate/Rhythm: 63 SR  BP:  Supine:   Sitting: 140/56  Standing:    SaO2: 96%RA  MODE:  Ambulation: 300 ft   POST:  Rate/Rhythm: 74 SR  BP:  Supine:   Sitting: 155/56  Standing:    SaO2: 95%RA 1405-1440 Pt walked 300 ft with asst x 1 with fairly steady gait. Pt stated she is a little unsteady normally but does not fall. Tolerated well. To recliner after walk. Family in room.   Luetta Nutting, RN BSN  07/01/2014 2:36 PM

## 2014-07-01 NOTE — Progress Notes (Addendum)
    Subjective:  The patient is feeling well this morning. She denies chest pain or shortness of breath. She walked a lap around the unit this morning and denies shortness of breath with that level of activity. She continues to complain of cough which was present before surgery.  Objective:  Vital Signs in the last 24 hours: Temp:  [97.2 F (36.2 C)-98.4 F (36.9 C)] 97.6 F (36.4 C) (03/08 0744) Pulse Rate:  [59-80] 78 (03/08 0700) Resp:  [14-27] 19 (03/08 0700) BP: (108-136)/(44-92) 136/71 mmHg (03/08 0700) SpO2:  [89 %-99 %] 97 % (03/08 0700) Arterial Line BP: (99-136)/(43-57) 129/56 mmHg (03/07 1700) FiO2 (%):  [28 %] 28 % (03/08 0400) Weight:  [189 lb 6 oz (85.9 kg)-199 lb 3.2 oz (90.357 kg)] 189 lb 6 oz (85.9 kg) (03/08 0500)  Intake/Output from previous day: 03/07 0701 - 03/08 0700 In: 2669.5 [I.V.:1719.5; IV Piggyback:950] Out: 2990 [Urine:2990]  Physical Exam: Pt is alert and oriented, pleasant elderly woman in NAD HEENT: normal Neck: JVP - normal Lungs: CTA bilaterally CV: RRR grade 1/6 ejection murmur best heard at the left lower sternal border Abd: soft, NT, Positive BS, no hepatomegaly Ext: no C/C/E, distal pulses intact and equal, bilateral groin sites are clear Skin: warm/dry no rash  Lab Results:  Recent Labs  06/30/14 1656 07/01/14 0407  WBC 3.8* 3.6*  HGB 9.4* 8.8*  PLT 91* 94*    Recent Labs  06/30/14 1655 06/30/14 1656 07/01/14 0407  NA 141  --  141  K 4.1  --  3.8  CL 106  --  108  CO2  --   --  28  GLUCOSE 110*  --  147*  BUN 11  --  8  CREATININE 0.70 0.67 0.76   No results for input(s): TROPONINI in the last 72 hours.  Invalid input(s): CK, MB  Cardiac Studies: 2-D echocardiogram pending  Tele: Personally reviewed, normal sinus rhythm with PACs  Assessment/Plan:  1. Acute on chronic diastolic heart failure, primarily related to progressive and severe aortic stenosis 2. Severe symptomatic aortic stenosis, now postoperative  day #1 from TAVR 3. Essential hypertension 4. Postoperative blood loss anemia, expected 5. Postoperative thrombocytopenia, expected  The patient is progressing well. Will start her back on her oral antihypertensive medications. We'll also start back on Amaryl and change her sliding scale to before meals and at bedtime. Will discontinue her Foley catheter and Cordis sheath. Transfer to telemetry today. Anticipate hospital discharge tomorrow. Her daughter is staying with her for the next few weeks.    [name], M.D. 07/01/2014, 8:05 AM

## 2014-07-01 NOTE — Progress Notes (Signed)
UR Completed.  [phone]  

## 2014-07-01 NOTE — Progress Notes (Signed)
1 Day Post-Op Procedure(s) (LRB): TRANSCATHETER AORTIC VALVE REPLACEMENT, TRANSFEMORAL (N/A) TRANSESOPHAGEAL ECHOCARDIOGRAM (TEE) (N/A) Subjective: No complaints. Has walked. Says she is ready to go home.  Objective: Vital signs in last 24 hours: Temp:  [97.2 F (36.2 C)-98.4 F (36.9 C)] 97.6 F (36.4 C) (03/08 0744) Pulse Rate:  [59-80] 78 (03/08 0700) Cardiac Rhythm:  [-] Normal sinus rhythm (03/08 0400) Resp:  [14-27] 19 (03/08 0700) BP: (108-136)/(44-92) 136/71 mmHg (03/08 0700) SpO2:  [89 %-99 %] 97 % (03/08 0700) Arterial Line BP: (99-136)/(43-57) 129/56 mmHg (03/07 1700) FiO2 (%):  [28 %] 28 % (03/08 0400) Weight:  [85.9 kg (189 lb 6 oz)-90.357 kg (199 lb 3.2 oz)] 85.9 kg (189 lb 6 oz) (03/08 0500)  Hemodynamic parameters for last 24 hours: PAP: (49-69)/(19-33) 59/23 mmHg CO:  [4.8 L/min-5.3 L/min] 4.8 L/min CI:  [2.7 L/min/m2-3 L/min/m2] 2.7 L/min/m2  Intake/Output from previous day: 03/07 0701 - 03/08 0700 In: 2669.5 [I.V.:1719.5; IV Piggyback:950] Out: 2990 [Urine:2990] Intake/Output this shift:    General appearance: alert and cooperative Neurologic: intact Heart: regular rate and rhythm, S1, S2 normal, no murmur, click, rub or gallop Lungs: clear to auscultation bilaterally Wound: right groin incision ok  Lab Results:  Recent Labs  06/30/14 1656 07/01/14 0407  WBC 3.8* 3.6*  HGB 9.4* 8.8*  HCT 27.5* 26.8*  PLT 91* 94*   BMET:  Recent Labs  06/30/14 1655 06/30/14 1656 07/01/14 0407  NA 141  --  141  K 4.1  --  3.8  CL 106  --  108  CO2  --   --  28  GLUCOSE 110*  --  147*  BUN 11  --  8  CREATININE 0.70 0.67 0.76  CALCIUM  --   --  7.9*    PT/INR:  Recent Labs  06/30/14 1105  LABPROT 14.8  INR 1.14   ABG    Component Value Date/Time   PHART 7.348* 06/30/2014 1525   HCO3 27.9* 06/30/2014 1525   TCO2 22 06/30/2014 1655   ACIDBASEDEF 2.0 06/30/2014 1100   O2SAT 96.0 06/30/2014 1525   CBG (last 3)   Recent Labs   06/30/14 1922 06/30/14 2321 07/01/14 0337  GLUCAP 163* 72 109*    Assessment/Plan: S/P Procedure(s) (LRB): TRANSCATHETER AORTIC VALVE REPLACEMENT, TRANSFEMORAL (N/A) TRANSESOPHAGEAL ECHOCARDIOGRAM (TEE) (N/A)  She looks great Plan echo today Transfer to 2W and mobilize Plan home tomorrow.   LOS: 1 day    Alleen Borne 07/01/2014

## 2014-07-01 NOTE — Progress Notes (Signed)
Instructed patient and family to keep clean gauzes to the folds at the bilateral groin sites.  Instructed pt and family to do this at home as well and prepared them a take home bag with extra gauzes for their use after discharge.

## 2014-07-01 NOTE — Progress Notes (Signed)
Echocardiogram 2D Echocardiogram has been performed.  Lori Gilbert 07/01/2014, 12:59 PM

## 2014-07-01 NOTE — Progress Notes (Signed)
Transferred to 2W39 via wheelchair. Portable monitor on. No changes.

## 2014-07-02 ENCOUNTER — Encounter (HOSPITAL_COMMUNITY): Payer: Self-pay | Admitting: Physician Assistant

## 2014-07-02 LAB — GLUCOSE, CAPILLARY
Glucose-Capillary: 113 mg/dL — ABNORMAL HIGH (ref 70–99)
Glucose-Capillary: 148 mg/dL — ABNORMAL HIGH (ref 70–99)

## 2014-07-02 MED ORDER — CLOPIDOGREL BISULFATE 75 MG PO TABS: 75.0000 mg | ORAL_TABLET | Freq: Every day | ORAL | Status: DC

## 2014-07-02 MED ORDER — METOPROLOL TARTRATE 25 MG PO TABS: 12.5000 mg | ORAL_TABLET | Freq: Two times a day (BID) | ORAL | Status: AC

## 2014-07-02 MED ORDER — ASPIRIN 81 MG PO CHEW
81.0000 mg | CHEWABLE_TABLET | Freq: Every day | ORAL | Status: DC
Start: 2014-07-02 — End: 2018-03-12

## 2014-07-02 MED ORDER — PANTOPRAZOLE SODIUM 40 MG PO TBEC: 40.0000 mg | DELAYED_RELEASE_TABLET | Freq: Every day | ORAL | Status: AC

## 2014-07-02 MED FILL — Heparin Sodium (Porcine) Inj 1000 Unit/ML: INTRAMUSCULAR | Qty: 30 | Status: AC

## 2014-07-02 MED FILL — Insulin Regular (Human) Inj 100 Unit/ML: INTRAMUSCULAR | Qty: 2.5 | Status: AC

## 2014-07-02 MED FILL — Potassium Chloride Inj 2 mEq/ML: INTRAVENOUS | Qty: 40 | Status: AC

## 2014-07-02 MED FILL — Magnesium Sulfate Inj 50%: INTRAMUSCULAR | Qty: 10 | Status: AC

## 2014-07-02 NOTE — Progress Notes (Signed)
    Subjective:  Patient feels well. She ambulated in the hallway this morning without symptoms. She denies chest pain or shortness of breath.  Objective:  Vital Signs in the last 24 hours: Temp:  [98 F (36.7 C)-98.5 F (36.9 C)] 98.5 F (36.9 C) (03/09 0412) Pulse Rate:  [68-90] 70 (03/09 0412) Resp:  [17-22] 17 (03/09 0412) BP: (122-149)/(49-80) 145/55 mmHg (03/09 0412) SpO2:  [90 %-98 %] 91 % (03/09 0412) Weight:  [186 lb 14.4 oz (84.777 kg)] 186 lb 14.4 oz (84.777 kg) (03/09 0412)  Intake/Output from previous day: 03/08 0701 - 03/09 0700 In: 480 [P.O.:480] Out: 410 [Urine:410]  Physical Exam: Pt is alert and oriented, pleasant elderly woman in NAD HEENT: normal Neck: JVP - normal Lungs: CTA bilaterally CV: RRR grade 2/6 early peaking systolic ejection murmur at the right upper sternal border Abd: soft, NT, Positive BS, no hepatomegaly Ext: no C/C/E, distal pulses intact and equal, right groin incision site healing well, left groin cath site clear Skin: warm/dry no rash   Lab Results:  Recent Labs  06/30/14 1656 07/01/14 0407  WBC 3.8* 3.6*  HGB 9.4* 8.8*  PLT 91* 94*    Recent Labs  06/30/14 1655 06/30/14 1656 07/01/14 0407  NA 141  --  141  K 4.1  --  3.8  CL 106  --  108  CO2  --   --  28  GLUCOSE 110*  --  147*  BUN 11  --  8  CREATININE 0.70 0.67 0.76   No results for input(s): TROPONINI in the last 72 hours.  Invalid input(s): CK, MB  Cardiac Studies: 2-D echocardiogram: Study Conclusions  - Left ventricle: The cavity size was normal. There was moderate concentric hypertrophy. Systolic function was normal. The estimated ejection fraction was in the range of 55% to 60%. Wall motion was normal; there were no regional wall motion abnormalities. - Aortic valve: The AV is not adequately visualized to comment on morphology. The doppler measurements are consistent with mild Aortic stenosis. - Mitral valve: Severely calcified  annulus. Moderate diffuse thickening and calcification of the anterior leaflet, with moderate involvement of chords. Severe diffuse calcification of the posterior leaflet. posterior leaflet mobility was restricted. The findings are consistent with mild stenosis. - Left atrium: The atrium was moderately dilated. - Pulmonary arteries: PA peak pressure: 56 mm Hg (S).  Impressions:  - The right ventricular systolic pressure was increased consistent with moderate pulmonary hypertension  Tele: Personally reviewed: Sinus rhythm with occasional PACs  Assessment/Plan:  1. Acute on chronic diastolic heart failure, primarily related to progressive and severe aortic stenosis/associated LVH  2. Severe symptomatic aortic stenosis, now postoperative day #2 from TAVR - pt progressing well and stable for discharge. Post-op echo reviewed and shows normal bioprosthetic valve function. Should continue on lifelong ASA and take plavix x 6 months.  3. Essential hypertension - controlled  4. Postoperative blood loss anemia, expected  5. Postoperative thrombocytopenia, expected - mild  Dispo: home today. FU Dr Donnie Aho 2 weeks. Will arrange 30 day visit with echo in Valve Clinic - will call patient with that appointment date/time. Daughter to stay with patient x 2 weeks, but she seems to be recovering quite well.  [name], M.D. 07/02/2014, 8:48 AM

## 2014-07-02 NOTE — Progress Notes (Signed)
1020-1050 Cardiac Rehab Completed discharge education with pt and her daughter.Pt voices understanding. Pt agrees to Outpt. CRP in [place], will send referral. Beatrix Fetters, RN 07/02/2014 10:54 AM

## 2014-07-02 NOTE — Progress Notes (Signed)
Nursing note  Patient and family given, AVS,  discharge instructions medication list and prescriptions sent to personal pharmacy. Follow up appointments given. All questions answered will discharge home as ordered. Yomaira Solar, Randall An RN

## 2014-07-02 NOTE — Discharge Summary (Signed)
Discharge Summary   Patient ID: Lori Gilbert MRN: [id number], DOB/AGE: [birth year] 81 y.o. Admit date: 06/30/2014 D/C date:     07/02/2014  Primary Cardiologist: Dr. Donnie Aho Dr. Excell Seltzer ( TAVR )  Principal Problem:   S/P TAVR (transcatheter aortic valve replacement) Active Problems:   Hypertrophic obstructive cardiomyopathy   Diabetes mellitus type 2, noninsulin dependent   GERD (gastroesophageal reflux disease)   Obesity (BMI 30-39.9)   Hyperlipidemia   CAD (coronary artery disease), native coronary artery   Severe aortic stenosis    Admission Dates: 06/30/14-07/02/14 Discharge Diagnosis: Severe, symptomatic aortic stenosis s/p successful TAVR on 06/30/14  HPI: Lori Gilbert is a 81 y.o. female with a history of HTN, HLD, non obst CAD, GERD, DM, aortic stenosis and hypertrophic cardiomyopathy who presented to [facility] on 06/30/14 for planned TAVR for severe symptomatic AS.  The patient initially presented with 3 months of shortness of breath with significant exertion. She is widowed for the past 3 years and lives alone and is sedentary. She denies symptoms with low level activity in her house but moderate activity like brisk walking, lifting or going up stairs will bring on dyspnea. Her echo was done by Dr. Donnie Aho on 02/10/2014. The aortic valve is severely stenotic, calcified and thickened with markedly restricted leaflet mobility. There is severe MAC and mild MR. There is severe concentric LVH with possible mid-cavitary obstruction when the septum and lateral wall meet. The EF is 65-70%. The mean AV gradient is 75 mm Hg and the peak is 127 mm Hg. The LVOT peak gradient was measured at 4.8 mm Hg. The AVA was 0.63 cm2 by Vmax. She underwent R and L heart cath by Dr. Excell Seltzer on 04/23/2014 which showed a peak to peak AV gradient of 113 mm Hg with a mean of 92 and an AVA of 0.45. There was severe pulmonary hypertension with large V waves of 36. There was some angulation at the distal LM and possibly  some mild associated stenosis in some views but no other significant stenosis. The question was whether there was a distinct septal bulge that was causing the obstruction and contributing to the high gradient that could be resected.A TEEshowed severe AS with a mean gradient of 92 mm Hg. There is severe concentric LVH with an EF of 60% and no SAM or LVOT gradient. The aortic root appearsed small. Dr. Laneta Simmers felt that she may require a root replacement or enlargement if she had open AVR which would significantly increase the risk in this 81 year old patient. Her STS risk of mortality for open AVR was fairly low but he thought her risk would be much higher if she required root enlargement or replacement.He felt that TAVR may be a reasonable alternative for her. Cardiac CT showed a heavily calcified trileaflet aortic valve with an annulus suitable for a 23 mm Sapien 3 valve. An abdominal and pelvic CTA showed suitable pelvic arterial access bilaterally for a transfemoral approach. She was admitted to [facility] on 06/30/14 for TAVR via transfemoral approach.   Hospital Course  Acute on chronic diastolic heart failure, primarily related to progressive and severe aortic stenosis/associated LVH -- Resume home lasix 20mg  qd.   Severe symptomatic aortic stenosis, now postoperative day #2 from TAVR w/ 23 mm Edwards Sapien 3 transcatheter heart valve placed via open right transfemoral approach - pt progressing well and stable for discharge.  --  Post-op echo reviewed and shows normal bioprosthetic valve function. Should continue on lifelong ASA and  take plavix x 6 months.  Essential hypertension - controlled on dilt XR 240mg , losartan 100mg  qd, metoprolol 12.5 mgBID  Postoperative blood loss anemia, expected- H/H 8.8/26.8  Postoperative thrombocytopenia, expected - mild PLT 94.  GERD- will change prilosec to protonix in the setting of Plavix therapy to avoid potential interaction   DM- resume home regimen  Dispo:  home today. FU Dr Donnie Aho 2 weeks. Will arrange 30 day visit with echo in Valve Clinic - will call patient with that appointment date/time. Daughter to stay with patient x 2 weeks, but she seems to be recovering quite well.  The patient has had an uncomplicated hospital course and is recovering well. The femoral catheter site is stable. She has been seen by Dr. Excell Seltzer today and deemed ready for discharge home. All follow-up appointments have been scheduled.  Discharge medications are listed below.   Discharge Vitals: Blood pressure 145/55, pulse 70, temperature 98.5 F (36.9 C), temperature source Oral, resp. rate 17, height 5' 4.5" (1.638 m), weight 186 lb 14.4 oz (84.777 kg), SpO2 91 %.  Labs: Lab Results  Component Value Date   WBC 3.6* 07/01/2014   HGB 8.8* 07/01/2014   HCT 26.8* 07/01/2014   MCV 85.6 07/01/2014   PLT 94* 07/01/2014    Recent Labs Lab 06/26/14 1302  07/01/14 0407  NA 139  < > 141  K 3.9  < > 3.8  CL 107  < > 108  CO2 25  --  28  BUN 17  < > 8  CREATININE 0.99  < > 0.76  CALCIUM 8.7  --  7.9*  PROT 6.3  --   --   BILITOT 0.7  --   --   ALKPHOS 72  --   --   ALT 16  --   --   AST 20  --   --   GLUCOSE 134*  < > 147*  < > = values in this interval not displayed. No results for input(s): CKTOTAL, CKMB, TROPONINI in the last 72 hours. No results found for: CHOL, HDL, LDLCALC, TRIG No results found for: DDIMER  Diagnostic Studies/Procedures   Dg Chest 2 View  06/18/2014   CLINICAL DATA:  Preoperative evaluation for TAVR  EXAM: CHEST  2 VIEW  COMPARISON:  04/28/2014  FINDINGS: Cardiac shadow is prominent but stable from the prior exam. The previously seen vascular congestion has resolved in the interval. No focal infiltrate or effusion is seen. No acute bony abnormality is noted. Degenerative changes about the shoulder joints are noted.  IMPRESSION: No active cardiopulmonary disease.   Electronically Signed   By: Alcide Clever M.D.   On: 06/18/2014 16:56   Dg  Chest Port 1 View  07/01/2014   CLINICAL DATA:  81 year old female with aortic stenosis status post repair. Initial encounter.  EXAM: PORTABLE CHEST - 1 VIEW  COMPARISON:  06/30/2014 and earlier.  FINDINGS: Portable AP upright view at 0626 hr. Right IJ approach Swan-Ganz catheter is been removed. Introducer sheath remains. Stable cardiac size and mediastinal contours. Larger lung volumes. No pneumothorax or pulmonary edema. No consolidation. Possible small bilateral effusions. Aortic valve implant or prosthesis re- identified.  IMPRESSION: 1. Swan-Ganz catheter removed, right IJ introducer sheath remains. 2. Improved lung volumes and ventilation.  Possible small effusions.   Electronically Signed   By: Odessa Fleming M.D.   On: 07/01/2014 07:47   Dg Chest Port 1 View  06/30/2014   CLINICAL DATA:  Postop aortic stenosis.  EXAM: PORTABLE  CHEST - 1 VIEW  COMPARISON:  06/18/2014  FINDINGS: Changes of aortic replacement/repair. Right Swan-Ganz catheter tip is in the main right pulmonary artery. No pneumothorax. Left base atelectasis. No effusions. No acute bony abnormality.  IMPRESSION: Postoperative changes.  No pneumothorax.  Left base atelectasis.   Electronically Signed   By: Charlett Nose M.D.   On: 06/30/2014 12:40     2-D ECHO 07/01/14 Study Conclusions - Left ventricle: The cavity size was normal. There was moderate concentric hypertrophy. Systolic function was normal. The estimated ejection fraction was in the range of 55% to 60%. Wall motion was normal; there were no regional wall motion abnormalities. - Aortic valve: The AV is not adequately visualized to comment on morphology. The doppler measurements are consistent with mild Aortic stenosis. - Mitral valve: Severely calcified annulus. Moderate diffuse thickening and calcification of the anterior leaflet, with moderate involvement of chords. Severe diffuse calcification of the posterior leaflet. posterior leaflet mobility was  restricted. The findings are consistent with mild stenosis. - Left atrium: The atrium was moderately dilated. - Pulmonary arteries: PA peak pressure: 56 mm Hg (S). Impressions: - The right ventricular systolic pressure was increased consistent with moderate pulmonary hypertension   Discharge Medications     Medication List    STOP taking these medications        azithromycin 250 MG tablet  Commonly known as:  ZITHROMAX     omeprazole 20 MG capsule  Commonly known as:  PRILOSEC      TAKE these medications        amoxicillin 500 MG capsule  Commonly known as:  AMOXIL  Take four capsules one hour before dental appointment.     aspirin 81 MG chewable tablet  Chew 1 tablet (81 mg total) by mouth daily.     benzonatate 200 MG capsule  Commonly known as:  TESSALON  Take 200 mg by mouth 3 (three) times daily as needed for cough.     chlorhexidine 0.12 % solution  Commonly known as:  PERIDEX  Rinse with 15 mls twice daily for 30 seconds. Use after breakfast and at bedtime. Spit out excess. Do not swallow.     clopidogrel 75 MG tablet  Commonly known as:  PLAVIX  Take 1 tablet (75 mg total) by mouth daily with breakfast.     diltiazem 240 MG 24 hr capsule  Commonly known as:  DILACOR XR  Take 240 mg by mouth daily.     furosemide 20 MG tablet  Commonly known as:  LASIX  Take 20 mg by mouth daily.     glimepiride 1 MG tablet  Commonly known as:  AMARYL  Take 1 mg by mouth daily with breakfast.     ibuprofen 200 MG tablet  Commonly known as:  ADVIL,MOTRIN  Take 400 mg by mouth every 6 (six) hours as needed (pain).     IRON PO  Take 1 tablet by mouth daily.     losartan 100 MG tablet  Commonly known as:  COZAAR  Take 100 mg by mouth daily.     metFORMIN 500 MG tablet  Commonly known as:  GLUCOPHAGE  Take 500 mg by mouth 2 (two) times daily with a meal.     metoprolol tartrate 25 MG tablet  Commonly known as:  LOPRESSOR  Take 0.5 tablets (12.5 mg total)  by mouth 2 (two) times daily.     pantoprazole 40 MG tablet  Commonly known as:  PROTONIX  Take 1 tablet (  40 mg total) by mouth daily.     potassium chloride 10 MEQ CR capsule  Commonly known as:  MICRO-K  Take 10 mEq by mouth daily.     pravastatin 40 MG tablet  Commonly known as:  PRAVACHOL  Take 40 mg by mouth daily.     sertraline 100 MG tablet  Commonly known as:  ZOLOFT  Take 100 mg by mouth daily.     traZODone 50 MG tablet  Commonly known as:  DESYREL  Take 50 mg by mouth at bedtime.        Disposition   The patient will be discharged in stable condition to home.  Follow-up Information    Follow up with Darden Palmer, MD On 07/15/2014.   Specialty:  Cardiology   Why:  @ 10:30am   Contact information:   [address] [place] [address] [phone]       Follow up with [name], MD.   Specialty:  Cardiology   Why:  The office will call you to make an appoinment in the valve clinic. If you do not hear from them, please contact them   Contact information:   [address] [place] [address] [phone]         Duration of Discharge Encounter: Greater than 30 minutes including physician and PA time.  Signed, Janee Morn, KATHRYN R PA-C 07/02/2014, 10:00 AM

## 2014-07-02 NOTE — Progress Notes (Signed)
      [address]       Lori Gilbert [address]             [phone]      2 Days Post-Op Procedure(s) (LRB): TRANSCATHETER AORTIC VALVE REPLACEMENT, TRANSFEMORAL (N/A) TRANSESOPHAGEAL ECHOCARDIOGRAM (TEE) (N/A)   Subjective:  Lori Gilbert has no complaints.  She is happy to be discharged home.  + ambulation  Objective: Vital signs in last 24 hours: Temp:  [98 F (36.7 C)-98.5 F (36.9 C)] 98.5 F (36.9 C) (03/09 0412) Pulse Rate:  [68-90] 70 (03/09 0412) Cardiac Rhythm:  [-] Normal sinus rhythm (03/08 2209) Resp:  [17-22] 17 (03/09 0412) BP: (122-149)/(49-80) 145/55 mmHg (03/09 0412) SpO2:  [90 %-98 %] 91 % (03/09 0412) Weight:  [186 lb 14.4 oz (84.777 kg)] 186 lb 14.4 oz (84.777 kg) (03/09 0412)  Intake/Output from previous day: 03/08 0701 - 03/09 0700 In: 480 [P.O.:480] Out: 410 [Urine:410] Intake/Output this shift: Total I/O In: 240 [P.O.:240] Out: -   General appearance: alert, cooperative and no distress Heart: regular rate and rhythm Lungs: clear to auscultation bilaterally Abdomen: soft, non-tender; bowel sounds normal; no masses,  no organomegaly Extremities: edema minimal Wound: clean and dry  Lab Results:  Recent Labs  06/30/14 1656 07/01/14 0407  WBC 3.8* 3.6*  HGB 9.4* 8.8*  HCT 27.5* 26.8*  PLT 91* 94*   BMET:  Recent Labs  06/30/14 1655 06/30/14 1656 07/01/14 0407  NA 141  --  141  K 4.1  --  3.8  CL 106  --  108  CO2  --   --  28  GLUCOSE 110*  --  147*  BUN 11  --  8  CREATININE 0.70 0.67 0.76  CALCIUM  --   --  7.9*    PT/INR:  Recent Labs  06/30/14 1105  LABPROT 14.8  INR 1.14   ABG    Component Value Date/Time   PHART 7.348* 06/30/2014 1525   HCO3 27.9* 06/30/2014 1525   TCO2 22 06/30/2014 1655   ACIDBASEDEF 2.0 06/30/2014 1100   O2SAT 96.0 06/30/2014 1525   CBG (last 3)   Recent Labs  07/01/14 1645 07/01/14 2111 07/02/14 0603  GLUCAP 93 93 113*    Assessment/Plan: S/P Procedure(s)  (LRB): TRANSCATHETER AORTIC VALVE REPLACEMENT, TRANSFEMORAL (N/A) TRANSESOPHAGEAL ECHOCARDIOGRAM (TEE) (N/A)  1. CV- Hemodynamically stable 2. Patient doing well post TAVR, discharge home today per Cardiology   LOS: 2 days    Lori Gilbert, Lori Gilbert 07/02/2014

## 2014-07-04 LAB — TYPE AND SCREEN
ABO/RH(D): O NEG
Antibody Screen: NEGATIVE
UNIT DIVISION: 0
Unit division: 0

## 2014-07-09 ENCOUNTER — Encounter: Payer: Self-pay | Admitting: Surgery

## 2014-07-09 ENCOUNTER — Ambulatory Visit (INDEPENDENT_AMBULATORY_CARE_PROVIDER_SITE_OTHER): Payer: Medicare Other | Admitting: Surgery

## 2014-07-09 VITALS — BP 135/70 | HR 67 | Resp 20 | Ht 64.5 in | Wt 186.0 lb

## 2014-07-09 DIAGNOSIS — Z952 Presence of prosthetic heart valve: Secondary | ICD-10-CM

## 2014-07-09 DIAGNOSIS — Z954 Presence of other heart-valve replacement: Secondary | ICD-10-CM

## 2014-07-09 DIAGNOSIS — I350 Nonrheumatic aortic (valve) stenosis: Secondary | ICD-10-CM

## 2014-07-09 DIAGNOSIS — E668 Other obesity: Secondary | ICD-10-CM | POA: Diagnosis not present

## 2014-07-09 DIAGNOSIS — I119 Hypertensive heart disease without heart failure: Secondary | ICD-10-CM | POA: Diagnosis not present

## 2014-07-09 NOTE — Progress Notes (Signed)
HPI: Patient returns for routine postoperative follow-up having undergone TAVR using a 23 mm Sapien 3 valve via a right transfemoral approach on 06/30/2014. The patient's early postoperative recovery while in the hospital was notable for an uncomplicated postop course. Since hospital discharge the patient reports that she has been feeling better. She is ambulating more than preop and denies shortness of breath or chest pain. Her only complaint is of a cough that waxes and wanes from day to day. There is no significant sputum production. She has no fever or chills. She saw Dr. Donnie Aho yesterday.   Current Outpatient Prescriptions  Medication Sig Dispense Refill  . aspirin 81 MG chewable tablet Chew 1 tablet (81 mg total) by mouth daily. 30 tablet   . benzonatate (TESSALON) 200 MG capsule Take 200 mg by mouth 3 (three) times daily as needed for cough.    . chlorhexidine (PERIDEX) 0.12 % solution Rinse with 15 mls twice daily for 30 seconds. Use after breakfast and at bedtime. Spit out excess. Do not swallow. 480 mL 6  . clopidogrel (PLAVIX) 75 MG tablet Take 1 tablet (75 mg total) by mouth daily with breakfast. 30 tablet 6  . diltiazem (DILACOR XR) 240 MG 24 hr capsule Take 240 mg by mouth daily.    . furosemide (LASIX) 20 MG tablet Take 20 mg by mouth daily.     Marland Kitchen glimepiride (AMARYL) 1 MG tablet Take 1 mg by mouth daily with breakfast.    . ibuprofen (ADVIL,MOTRIN) 200 MG tablet Take 400 mg by mouth every 6 (six) hours as needed (pain).    . IRON PO Take 1 tablet by mouth daily.    Marland Kitchen losartan (COZAAR) 100 MG tablet Take 100 mg by mouth daily.  3  . metFORMIN (GLUCOPHAGE) 500 MG tablet Take 500 mg by mouth 2 (two) times daily with a meal.     . metoprolol tartrate (LOPRESSOR) 25 MG tablet Take 0.5 tablets (12.5 mg total) by mouth 2 (two) times daily. 60 tablet 11  . pantoprazole (PROTONIX) 40 MG tablet Take 1 tablet (40 mg total) by mouth daily. 30 tablet 11  . potassium chloride (MICRO-K)  10 MEQ CR capsule Take 10 mEq by mouth daily.  2  . pravastatin (PRAVACHOL) 40 MG tablet Take 40 mg by mouth daily.    . sertraline (ZOLOFT) 100 MG tablet Take 100 mg by mouth daily.    . traZODone (DESYREL) 50 MG tablet Take 50 mg by mouth at bedtime.      No current facility-administered medications for this visit.    Physical Exam: BP 135/70 mmHg  Pulse 67  Resp 20  Ht 5' 4.5" (1.638 m)  Wt 186 lb (84.369 kg)  BMI 31.45 kg/m2  SpO2 95% She looks well Cardiac exam shows a regular rate and rhythm with a 1/6 systolic flow murmur across her prosthetic valve. There is no diastolic murmur. Lungs are clear The right groin incision is healing well There is trace bilateral ankle edema.   Impression:  She is doing well overall. Her POD 1 echo looked good with normal prosthetic valve function with no perivalvular leak. She does have significant mitral valve disease with mild MS. I encouraged her to continue ambulating to build up some stamina. I told her she could return to driving. I am not sure what her cough is coming from. This was present preop. Her last CXR was unremarkable. It could be due to sinus drainage which she says has been  a problem for her. Her lungs sound good at this time.  Plan:  She has a follow up appt with Dr. Excell Seltzer in a few weeks and will have an echo at 1 month at that appt.   Alleen Borne, MD [facility] [phone]

## 2014-07-22 DIAGNOSIS — F419 Anxiety disorder, unspecified: Secondary | ICD-10-CM | POA: Diagnosis not present

## 2014-07-22 DIAGNOSIS — J310 Chronic rhinitis: Secondary | ICD-10-CM | POA: Diagnosis not present

## 2014-07-22 DIAGNOSIS — E119 Type 2 diabetes mellitus without complications: Secondary | ICD-10-CM | POA: Diagnosis not present

## 2014-07-22 DIAGNOSIS — K219 Gastro-esophageal reflux disease without esophagitis: Secondary | ICD-10-CM | POA: Diagnosis not present

## 2014-07-22 DIAGNOSIS — E780 Pure hypercholesterolemia: Secondary | ICD-10-CM | POA: Diagnosis not present

## 2014-07-22 DIAGNOSIS — I10 Essential (primary) hypertension: Secondary | ICD-10-CM | POA: Diagnosis not present

## 2014-08-05 ENCOUNTER — Telehealth: Payer: Self-pay | Admitting: Cardiovascular Disease

## 2014-08-05 DIAGNOSIS — M1712 Unilateral primary osteoarthritis, left knee: Secondary | ICD-10-CM | POA: Diagnosis not present

## 2014-08-05 NOTE — Telephone Encounter (Signed)
Patient going to orthopedist today for knee injury. Possible knee injection (steroid). Reviewed by Dr. Anne Fu - no need for SBE prophylaxis for orthopedic injection. Informed patient and she verbalized understanding and appreciation for return phone call.

## 2014-08-05 NOTE — Telephone Encounter (Signed)
New Message   Patient is calling to find out if she needs to have antibiotics before her steroid shot today.

## 2014-08-06 ENCOUNTER — Encounter: Payer: Self-pay | Admitting: Cardiovascular Disease

## 2014-08-06 ENCOUNTER — Ambulatory Visit (HOSPITAL_COMMUNITY): Payer: Medicare Other | Attending: Cardiology | Admitting: Radiology

## 2014-08-06 ENCOUNTER — Other Ambulatory Visit (HOSPITAL_COMMUNITY): Payer: Self-pay | Admitting: Cardiovascular Disease

## 2014-08-06 ENCOUNTER — Ambulatory Visit (INDEPENDENT_AMBULATORY_CARE_PROVIDER_SITE_OTHER): Payer: Medicare Other | Admitting: Cardiovascular Disease

## 2014-08-06 VITALS — BP 130/82 | HR 65 | Ht 64.5 in | Wt 185.1 lb

## 2014-08-06 DIAGNOSIS — I350 Nonrheumatic aortic (valve) stenosis: Secondary | ICD-10-CM | POA: Diagnosis not present

## 2014-08-06 DIAGNOSIS — Z954 Presence of other heart-valve replacement: Secondary | ICD-10-CM | POA: Diagnosis not present

## 2014-08-06 DIAGNOSIS — I359 Nonrheumatic aortic valve disorder, unspecified: Secondary | ICD-10-CM | POA: Diagnosis not present

## 2014-08-06 DIAGNOSIS — Z952 Presence of prosthetic heart valve: Secondary | ICD-10-CM

## 2014-08-06 NOTE — Progress Notes (Signed)
Cardiology Office Note   Date:  08/06/2014   ID:  BLANCHIE CROWE, DOB 10/23/1932, MRN [id number]  PCP:  Marin Comment, FNP  Cardiologist:  [name], MD    No chief complaint on file.    History of Present Illness: Lori Gilbert is a 81 y.o. female who presents for follow-up evaluation after undergoing TAVR 06/30/2014 via a transfemoral approach. She had very severe aortic stenosis with mean gradient measured as high as 92 mmHg. She was treated with a 23 mm Sapien 3 THV. The patient had an uncomplicated postoperative recovery and she was discharged home on postoperative day 2.  She continues to do well. She did have a fall and bruised her knee recently. She denies any chest pain or shortness of breath. She is eager to get back out on her riding mower and yard work. She denies leg swelling, orthopnea, PND, or heart palpitations. She has seen Dr. Donnie Aho back in follow-up.   Past Medical History  Diagnosis Date  . H/O hiatal hernia   . CAD (coronary artery disease), native coronary artery     Mild distal left main, 30-40% LAD, 20% RCA, normal circ at cath 04/23/14 Dr. Excell Seltzer   . Hypertensive heart disease 03/25/2014  . Hypertrophic obstructive cardiomyopathy 03/25/2014  . Aortic stenosis     a. s/p TAVR 06/30/14  . Hyperlipidemia     takes Pravastatin daily  . Hypertension     takes Diltiazem and Cozaar daily  . Aortic stenosis     severe   . History of bronchitis     couple of yrs ago  . Arthritis   . Joint pain   . GERD (gastroesophageal reflux disease) 03/25/2014    takes Omeprazole daily  . Nocturia   . Anemia     takes Iron daily  . Diabetes mellitus without complication     takes Metformin and Amaryl daily  . History of shingles   . S/P TAVR (transcatheter aortic valve replacement) 06/30/2014    23 mm Edwards Sapien 3 transcatheter heart valve placed via open right transfemoral approach    Past Surgical History  Procedure Laterality Date  . Cholecystectomy      . Dilation and curettage of uterus    . Ankle fracture surgery Bilateral   . Wrist fracture surgery Left   . Carpal tunnel release Right 05/13/2013    Procedure: RIGHT CARPAL TUNNEL RELEASE;  Surgeon: Thera Flake., MD;  Location: [facility];  Service: Orthopedics;  Laterality: Right;  . Left and right heart catheterization with coronary angiogram N/A 04/23/2014    Procedure: LEFT AND RIGHT HEART CATHETERIZATION WITH CORONARY ANGIOGRAM;  Surgeon: Micheline Chapman, MD;  Location: [facility] CATH LAB;  Service: Cardiovascular;  Laterality: N/A;  . Tee without cardioversion N/A 05/02/2014    Procedure: TRANSESOPHAGEAL ECHOCARDIOGRAM (TEE);  Surgeon: Wendall Stade, MD;  Location: [facility] ENDOSCOPY;  Service: Cardiovascular;  Laterality: N/A;  . Colonoscopy    . Cataract surgery Bilateral   . Transcatheter aortic valve replacement, transfemoral N/A 06/30/2014    Procedure: TRANSCATHETER AORTIC VALVE REPLACEMENT, TRANSFEMORAL;  Surgeon: [name], MD;  Location: [facility] OR;  Service: Open Heart Surgery;  Laterality: N/A;  . Tee without cardioversion N/A 06/30/2014    Procedure: TRANSESOPHAGEAL ECHOCARDIOGRAM (TEE);  Surgeon: [name], MD;  Location: [facility] OR;  Service: Open Heart Surgery;  Laterality: N/A;    Current Outpatient Prescriptions  Medication Sig Dispense Refill  . aspirin 81 MG chewable tablet  Chew 1 tablet (81 mg total) by mouth daily. 30 tablet   . chlorhexidine (PERIDEX) 0.12 % solution Rinse with 15 mls twice daily for 30 seconds. Use after breakfast and at bedtime. Spit out excess. Do not swallow. 480 mL 6  . clopidogrel (PLAVIX) 75 MG tablet Take 1 tablet (75 mg total) by mouth daily with breakfast. 30 tablet 6  . diltiazem (DILACOR XR) 240 MG 24 hr capsule Take 240 mg by mouth daily.    . furosemide (LASIX) 20 MG tablet Take 20 mg by mouth daily.     Marland Kitchen glimepiride (AMARYL) 1 MG tablet Take 1 mg by mouth daily with breakfast.    . IRON PO Take 1 tablet by mouth daily.    Marland Kitchen  KLOR-CON M10 10 MEQ tablet Take 10 mEq by mouth daily.  2  . levocetirizine (XYZAL) 5 MG tablet Take 5 mg by mouth every evening.   2  . losartan (COZAAR) 100 MG tablet Take 100 mg by mouth daily.  3  . metFORMIN (GLUCOPHAGE) 500 MG tablet Take 500 mg by mouth 2 (two) times daily with a meal.     . metoprolol tartrate (LOPRESSOR) 25 MG tablet Take 0.5 tablets (12.5 mg total) by mouth 2 (two) times daily. 60 tablet 11  . pantoprazole (PROTONIX) 40 MG tablet Take 1 tablet (40 mg total) by mouth daily. 30 tablet 11  . potassium chloride (MICRO-K) 10 MEQ CR capsule Take 10 mEq by mouth daily.  2  . pravastatin (PRAVACHOL) 40 MG tablet Take 40 mg by mouth daily.    . sertraline (ZOLOFT) 100 MG tablet Take 100 mg by mouth daily.    . traMADol (ULTRAM) 50 MG tablet Take 50 mg by mouth every 4 (four) hours as needed for moderate pain (take one tablet by mouth every 4 hours as needed for knee).     . traZODone (DESYREL) 50 MG tablet Take 50 mg by mouth at bedtime.      No current facility-administered medications for this visit.    Allergies:   Codeine   Social History:  The patient  reports that she has never smoked. She has never used smokeless tobacco. She reports that she does not drink alcohol or use illicit drugs.   Family History:  The patient's family history is not on file.    ROS:  Please see the history of present illness.  Otherwise, review of systems is positive for easy bruising and joint swelling.  All other systems are reviewed and negative.   PHYSICAL EXAM: VS:  BP 130/82 mmHg  Pulse 65  Ht 5' 4.5" (1.638 m)  Wt 185 lb 1.9 oz (83.97 kg)  BMI 31.30 kg/m2 , BMI Body mass index is 31.3 kg/(m^2). GEN: Well nourished, well developed, in no acute distress HEENT: normal Neck: no JVD, no masses.  Cardiac: RRR with 2/6 murmur, early peaking, at the RUSB        Respiratory:  clear to auscultation bilaterally, normal work of breathing GI: soft, nontender, nondistended, + BS MS: no  deformity or atrophy Ext: no pretibial edema, bruising is present on the left knee Skin: warm and dry, no rash Neuro:  Strength and sensation are intact Psych: euthymic mood, full affect  EKG:  EKG is not ordered today.  Recent Labs: 06/26/2014: ALT 16 07/01/2014: BUN 8; Creatinine 0.76; Hemoglobin 8.8*; Magnesium 2.0; Platelets 94*; Potassium 3.8; Sodium 141   Lipid Panel  No results found for: CHOL, TRIG, HDL, CHOLHDL, VLDL, LDLCALC,  LDLDIRECT    Wt Readings from Last 3 Encounters:  08/06/14 185 lb 1.9 oz (83.97 kg)  07/09/14 186 lb (84.369 kg)  07/02/14 186 lb 14.4 oz (84.777 kg)    Cardiac Studies Reviewed: 2D Echo: Study Conclusions  - Left ventricle: The cavity size was normal. Systolic function was normal. The estimated ejection fraction was in the range of 60% to 65%. Wall motion was normal; there were no regional wall motion abnormalities. Features are consistent with a pseudonormal left ventricular filling pattern, with concomitant abnormal relaxation and increased filling pressure (grade 2 diastolic dysfunction). Doppler parameters are consistent with high ventricular filling pressure. - Aortic valve: S/P TAVR with stable AVR. The is an increased gradient across the AV prosthesis due to small size of valve. No evidence of perivavular leak. Poorly visualized. Mean gradient (S): 26 mm Hg. Peak gradient (S): 49 mm Hg. - Mitral valve: The anterior mitral valve leaflet is thickened and calcified. The posterior mitral valve leaflet appears fixed. There is heavy mitral annular calcification of the posterior mitral valve annulus. Severely calcified annulus. - Left atrium: The atrium was moderately dilated. - Pulmonary arteries: PA peak pressure: 53 mm Hg (S).  Impressions:  - The right ventricular systolic pressure was increased consistent with moderate pulmonary hypertension.  ASSESSMENT AND PLAN: 81 year old woman now 1 month out from  TAVR for treatment of severe symptomatic aortic stenosis. She is doing remarkably well and has New York Heart Association functional class I symptoms at present. I personally reviewed her echo images today. Transvalvular gradients are moderately elevated. However, she has marked LVH with LV cavity obliteration during systole. I think her valve is functioning well. There is no paravalvular aortic insufficiency. She will return for a 1 year echocardiogram and office visit. Otherwise she will continue regular cardiology follow-up with Dr. Donnie Aho. She understands to follow SBE prophylaxis. She does complain of easy bruising and I recommended that she continue clopidogrel for a total of 6 months from the time of her TAVR procedure.  Current medicines are reviewed with the patient today.  The patient does not have concerns regarding medicines.  The following changes have been made:  no change  Labs/ tests ordered today include:  No orders of the defined types were placed in this encounter.   Disposition:   FU one year   Signed, [name], MD  08/06/2014 5:03 PM    [facility] [address] [place], [place], [place]  [address] Phone: [phone]; Fax: [phone]

## 2014-08-06 NOTE — Patient Instructions (Signed)
Your physician recommends that you continue on your current medications as directed. Please refer to the Current Medication list given to you today.  Your physician has requested that you have an echocardiogram in 1 YEAR. Echocardiography is a painless test that uses sound waves to create images of your heart. It provides your doctor with information about the size and shape of your heart and how well your heart's chambers and valves are working. This procedure takes approximately one hour. There are no restrictions for this procedure.  Your physician wants you to follow-up in: 1 YEAR with Dr Excell Seltzer.  You will receive a reminder letter in the mail two months in advance. If you don't receive a letter, please call our office to schedule the follow-up appointment.

## 2014-08-06 NOTE — Progress Notes (Signed)
Echocardiogram performed.  

## 2014-08-21 DIAGNOSIS — M1712 Unilateral primary osteoarthritis, left knee: Secondary | ICD-10-CM | POA: Diagnosis not present

## 2014-09-01 ENCOUNTER — Telehealth: Payer: Self-pay | Admitting: Cardiovascular Disease

## 2014-09-01 NOTE — Telephone Encounter (Signed)
New message    Request for surgical clearance:  1. What type of surgery is being performed? Ct of left knee - ultra gram injection     When is this surgery scheduled? Pending   2. Are there any medications that need to be held prior to surgery and how long? Blood thinner off 5 days before injection    3. Name of physician performing surgery? [facility]    4. What is your office phone and fax number? [phone] / ext [phone] 5. fax [phone]

## 2014-09-01 NOTE — Telephone Encounter (Signed)
I contacted Vernona Rieger and made her aware that the pt's primary cardiologist is Dr Ellwood Handler and surgical clearance would need to be obtained from his office.

## 2014-09-08 DIAGNOSIS — M2242 Chondromalacia patellae, left knee: Secondary | ICD-10-CM | POA: Diagnosis not present

## 2014-09-08 DIAGNOSIS — M1712 Unilateral primary osteoarthritis, left knee: Secondary | ICD-10-CM | POA: Diagnosis not present

## 2014-09-08 DIAGNOSIS — M84352A Stress fracture, left femur, initial encounter for fracture: Secondary | ICD-10-CM | POA: Diagnosis not present

## 2014-09-12 DIAGNOSIS — M1712 Unilateral primary osteoarthritis, left knee: Secondary | ICD-10-CM | POA: Diagnosis not present

## 2014-09-17 DIAGNOSIS — M1712 Unilateral primary osteoarthritis, left knee: Secondary | ICD-10-CM | POA: Diagnosis not present

## 2014-09-24 DIAGNOSIS — R2681 Unsteadiness on feet: Secondary | ICD-10-CM | POA: Diagnosis not present

## 2014-09-24 DIAGNOSIS — M84452D Pathological fracture, left femur, subsequent encounter for fracture with routine healing: Secondary | ICD-10-CM | POA: Diagnosis not present

## 2014-09-24 DIAGNOSIS — M25562 Pain in left knee: Secondary | ICD-10-CM | POA: Diagnosis not present

## 2014-09-24 DIAGNOSIS — M6281 Muscle weakness (generalized): Secondary | ICD-10-CM | POA: Diagnosis not present

## 2014-09-30 DIAGNOSIS — R2681 Unsteadiness on feet: Secondary | ICD-10-CM | POA: Diagnosis not present

## 2014-09-30 DIAGNOSIS — M84452D Pathological fracture, left femur, subsequent encounter for fracture with routine healing: Secondary | ICD-10-CM | POA: Diagnosis not present

## 2014-09-30 DIAGNOSIS — M25562 Pain in left knee: Secondary | ICD-10-CM | POA: Diagnosis not present

## 2014-09-30 DIAGNOSIS — M6281 Muscle weakness (generalized): Secondary | ICD-10-CM | POA: Diagnosis not present

## 2014-10-02 DIAGNOSIS — M25562 Pain in left knee: Secondary | ICD-10-CM | POA: Diagnosis not present

## 2014-10-02 DIAGNOSIS — R2681 Unsteadiness on feet: Secondary | ICD-10-CM | POA: Diagnosis not present

## 2014-10-02 DIAGNOSIS — M84452D Pathological fracture, left femur, subsequent encounter for fracture with routine healing: Secondary | ICD-10-CM | POA: Diagnosis not present

## 2014-10-02 DIAGNOSIS — M6281 Muscle weakness (generalized): Secondary | ICD-10-CM | POA: Diagnosis not present

## 2014-10-06 DIAGNOSIS — R2681 Unsteadiness on feet: Secondary | ICD-10-CM | POA: Diagnosis not present

## 2014-10-06 DIAGNOSIS — M6281 Muscle weakness (generalized): Secondary | ICD-10-CM | POA: Diagnosis not present

## 2014-10-06 DIAGNOSIS — M84452D Pathological fracture, left femur, subsequent encounter for fracture with routine healing: Secondary | ICD-10-CM | POA: Diagnosis not present

## 2014-10-06 DIAGNOSIS — M25562 Pain in left knee: Secondary | ICD-10-CM | POA: Diagnosis not present

## 2014-10-10 DIAGNOSIS — M84452A Pathological fracture, left femur, initial encounter for fracture: Secondary | ICD-10-CM | POA: Diagnosis not present

## 2014-10-13 DIAGNOSIS — I10 Essential (primary) hypertension: Secondary | ICD-10-CM | POA: Diagnosis not present

## 2014-10-13 DIAGNOSIS — K219 Gastro-esophageal reflux disease without esophagitis: Secondary | ICD-10-CM | POA: Diagnosis not present

## 2014-10-13 DIAGNOSIS — R609 Edema, unspecified: Secondary | ICD-10-CM | POA: Diagnosis not present

## 2014-10-13 DIAGNOSIS — F4321 Adjustment disorder with depressed mood: Secondary | ICD-10-CM | POA: Diagnosis not present

## 2014-10-13 DIAGNOSIS — E119 Type 2 diabetes mellitus without complications: Secondary | ICD-10-CM | POA: Diagnosis not present

## 2014-10-13 DIAGNOSIS — M25569 Pain in unspecified knee: Secondary | ICD-10-CM | POA: Diagnosis not present

## 2014-10-14 DIAGNOSIS — I350 Nonrheumatic aortic (valve) stenosis: Secondary | ICD-10-CM | POA: Diagnosis not present

## 2014-10-14 DIAGNOSIS — E668 Other obesity: Secondary | ICD-10-CM | POA: Diagnosis not present

## 2014-10-14 DIAGNOSIS — I119 Hypertensive heart disease without heart failure: Secondary | ICD-10-CM | POA: Diagnosis not present

## 2014-10-14 DIAGNOSIS — Z952 Presence of prosthetic heart valve: Secondary | ICD-10-CM | POA: Diagnosis not present

## 2014-10-14 DIAGNOSIS — E785 Hyperlipidemia, unspecified: Secondary | ICD-10-CM | POA: Diagnosis not present

## 2014-11-24 DIAGNOSIS — M84452D Pathological fracture, left femur, subsequent encounter for fracture with routine healing: Secondary | ICD-10-CM | POA: Diagnosis not present

## 2014-11-27 DIAGNOSIS — M1712 Unilateral primary osteoarthritis, left knee: Secondary | ICD-10-CM | POA: Diagnosis not present

## 2014-12-03 DIAGNOSIS — M810 Age-related osteoporosis without current pathological fracture: Secondary | ICD-10-CM | POA: Diagnosis not present

## 2014-12-04 DIAGNOSIS — M1712 Unilateral primary osteoarthritis, left knee: Secondary | ICD-10-CM | POA: Diagnosis not present

## 2014-12-10 DIAGNOSIS — M810 Age-related osteoporosis without current pathological fracture: Secondary | ICD-10-CM | POA: Diagnosis not present

## 2014-12-10 DIAGNOSIS — N390 Urinary tract infection, site not specified: Secondary | ICD-10-CM | POA: Diagnosis not present

## 2014-12-10 DIAGNOSIS — M25562 Pain in left knee: Secondary | ICD-10-CM | POA: Diagnosis not present

## 2014-12-11 DIAGNOSIS — M1712 Unilateral primary osteoarthritis, left knee: Secondary | ICD-10-CM | POA: Diagnosis not present

## 2014-12-24 DIAGNOSIS — I10 Essential (primary) hypertension: Secondary | ICD-10-CM | POA: Diagnosis not present

## 2014-12-24 DIAGNOSIS — Z7901 Long term (current) use of anticoagulants: Secondary | ICD-10-CM | POA: Diagnosis not present

## 2014-12-24 DIAGNOSIS — R040 Epistaxis: Secondary | ICD-10-CM | POA: Diagnosis not present

## 2014-12-25 DIAGNOSIS — E668 Other obesity: Secondary | ICD-10-CM | POA: Diagnosis not present

## 2014-12-25 DIAGNOSIS — I119 Hypertensive heart disease without heart failure: Secondary | ICD-10-CM | POA: Diagnosis not present

## 2014-12-25 DIAGNOSIS — Z952 Presence of prosthetic heart valve: Secondary | ICD-10-CM | POA: Diagnosis not present

## 2015-01-08 DIAGNOSIS — M84452D Pathological fracture, left femur, subsequent encounter for fracture with routine healing: Secondary | ICD-10-CM | POA: Diagnosis not present

## 2015-01-08 DIAGNOSIS — M1712 Unilateral primary osteoarthritis, left knee: Secondary | ICD-10-CM | POA: Diagnosis not present

## 2015-01-13 DIAGNOSIS — Z01812 Encounter for preprocedural laboratory examination: Secondary | ICD-10-CM | POA: Diagnosis not present

## 2015-01-13 DIAGNOSIS — R001 Bradycardia, unspecified: Secondary | ICD-10-CM | POA: Diagnosis not present

## 2015-01-13 DIAGNOSIS — I4510 Unspecified right bundle-branch block: Secondary | ICD-10-CM | POA: Diagnosis not present

## 2015-01-13 DIAGNOSIS — I2119 ST elevation (STEMI) myocardial infarction involving other coronary artery of inferior wall: Secondary | ICD-10-CM | POA: Diagnosis not present

## 2015-01-15 DIAGNOSIS — J309 Allergic rhinitis, unspecified: Secondary | ICD-10-CM | POA: Diagnosis not present

## 2015-01-15 DIAGNOSIS — M810 Age-related osteoporosis without current pathological fracture: Secondary | ICD-10-CM | POA: Diagnosis not present

## 2015-01-15 DIAGNOSIS — Z23 Encounter for immunization: Secondary | ICD-10-CM | POA: Diagnosis not present

## 2015-01-15 DIAGNOSIS — I10 Essential (primary) hypertension: Secondary | ICD-10-CM | POA: Diagnosis not present

## 2015-01-15 DIAGNOSIS — G4701 Insomnia due to medical condition: Secondary | ICD-10-CM | POA: Diagnosis not present

## 2015-02-03 DIAGNOSIS — I119 Hypertensive heart disease without heart failure: Secondary | ICD-10-CM | POA: Diagnosis not present

## 2015-02-03 DIAGNOSIS — Z952 Presence of prosthetic heart valve: Secondary | ICD-10-CM | POA: Diagnosis not present

## 2015-02-03 DIAGNOSIS — Z01810 Encounter for preprocedural cardiovascular examination: Secondary | ICD-10-CM | POA: Diagnosis not present

## 2015-02-03 DIAGNOSIS — I350 Nonrheumatic aortic (valve) stenosis: Secondary | ICD-10-CM | POA: Diagnosis not present

## 2015-02-12 DIAGNOSIS — M1712 Unilateral primary osteoarthritis, left knee: Secondary | ICD-10-CM | POA: Diagnosis not present

## 2015-02-24 DIAGNOSIS — Z79899 Other long term (current) drug therapy: Secondary | ICD-10-CM | POA: Diagnosis not present

## 2015-02-24 DIAGNOSIS — E7800 Pure hypercholesterolemia, unspecified: Secondary | ICD-10-CM | POA: Diagnosis not present

## 2015-02-24 DIAGNOSIS — E785 Hyperlipidemia, unspecified: Secondary | ICD-10-CM | POA: Diagnosis not present

## 2015-02-24 DIAGNOSIS — Z4789 Encounter for other orthopedic aftercare: Secondary | ICD-10-CM | POA: Diagnosis not present

## 2015-02-24 DIAGNOSIS — M1712 Unilateral primary osteoarthritis, left knee: Secondary | ICD-10-CM | POA: Diagnosis not present

## 2015-02-24 DIAGNOSIS — E119 Type 2 diabetes mellitus without complications: Secondary | ICD-10-CM | POA: Diagnosis not present

## 2015-02-24 DIAGNOSIS — I2510 Atherosclerotic heart disease of native coronary artery without angina pectoris: Secondary | ICD-10-CM | POA: Diagnosis not present

## 2015-02-24 DIAGNOSIS — I10 Essential (primary) hypertension: Secondary | ICD-10-CM | POA: Diagnosis not present

## 2015-02-24 DIAGNOSIS — Z7982 Long term (current) use of aspirin: Secondary | ICD-10-CM | POA: Diagnosis not present

## 2015-02-24 DIAGNOSIS — D649 Anemia, unspecified: Secondary | ICD-10-CM | POA: Diagnosis not present

## 2015-02-24 DIAGNOSIS — F329 Major depressive disorder, single episode, unspecified: Secondary | ICD-10-CM | POA: Diagnosis not present

## 2015-02-24 DIAGNOSIS — R262 Difficulty in walking, not elsewhere classified: Secondary | ICD-10-CM | POA: Diagnosis not present

## 2015-02-24 DIAGNOSIS — G8918 Other acute postprocedural pain: Secondary | ICD-10-CM | POA: Diagnosis not present

## 2015-02-24 DIAGNOSIS — K219 Gastro-esophageal reflux disease without esophagitis: Secondary | ICD-10-CM | POA: Diagnosis not present

## 2015-02-27 DIAGNOSIS — Z96652 Presence of left artificial knee joint: Secondary | ICD-10-CM | POA: Diagnosis not present

## 2015-02-27 DIAGNOSIS — Z7982 Long term (current) use of aspirin: Secondary | ICD-10-CM | POA: Diagnosis not present

## 2015-02-27 DIAGNOSIS — E119 Type 2 diabetes mellitus without complications: Secondary | ICD-10-CM | POA: Diagnosis not present

## 2015-02-27 DIAGNOSIS — Z471 Aftercare following joint replacement surgery: Secondary | ICD-10-CM | POA: Diagnosis not present

## 2015-02-27 DIAGNOSIS — F329 Major depressive disorder, single episode, unspecified: Secondary | ICD-10-CM | POA: Diagnosis not present

## 2015-02-27 DIAGNOSIS — I10 Essential (primary) hypertension: Secondary | ICD-10-CM | POA: Diagnosis not present

## 2015-03-01 DIAGNOSIS — Z471 Aftercare following joint replacement surgery: Secondary | ICD-10-CM | POA: Diagnosis not present

## 2015-03-01 DIAGNOSIS — F329 Major depressive disorder, single episode, unspecified: Secondary | ICD-10-CM | POA: Diagnosis not present

## 2015-03-01 DIAGNOSIS — E119 Type 2 diabetes mellitus without complications: Secondary | ICD-10-CM | POA: Diagnosis not present

## 2015-03-01 DIAGNOSIS — Z7982 Long term (current) use of aspirin: Secondary | ICD-10-CM | POA: Diagnosis not present

## 2015-03-01 DIAGNOSIS — I10 Essential (primary) hypertension: Secondary | ICD-10-CM | POA: Diagnosis not present

## 2015-03-01 DIAGNOSIS — Z96652 Presence of left artificial knee joint: Secondary | ICD-10-CM | POA: Diagnosis not present

## 2015-03-02 DIAGNOSIS — E119 Type 2 diabetes mellitus without complications: Secondary | ICD-10-CM | POA: Diagnosis not present

## 2015-03-02 DIAGNOSIS — Z7982 Long term (current) use of aspirin: Secondary | ICD-10-CM | POA: Diagnosis not present

## 2015-03-02 DIAGNOSIS — Z471 Aftercare following joint replacement surgery: Secondary | ICD-10-CM | POA: Diagnosis not present

## 2015-03-02 DIAGNOSIS — I10 Essential (primary) hypertension: Secondary | ICD-10-CM | POA: Diagnosis not present

## 2015-03-02 DIAGNOSIS — F329 Major depressive disorder, single episode, unspecified: Secondary | ICD-10-CM | POA: Diagnosis not present

## 2015-03-02 DIAGNOSIS — Z96652 Presence of left artificial knee joint: Secondary | ICD-10-CM | POA: Diagnosis not present

## 2015-03-03 DIAGNOSIS — Z7982 Long term (current) use of aspirin: Secondary | ICD-10-CM | POA: Diagnosis not present

## 2015-03-03 DIAGNOSIS — Z96652 Presence of left artificial knee joint: Secondary | ICD-10-CM | POA: Diagnosis not present

## 2015-03-03 DIAGNOSIS — Z471 Aftercare following joint replacement surgery: Secondary | ICD-10-CM | POA: Diagnosis not present

## 2015-03-03 DIAGNOSIS — F329 Major depressive disorder, single episode, unspecified: Secondary | ICD-10-CM | POA: Diagnosis not present

## 2015-03-03 DIAGNOSIS — E119 Type 2 diabetes mellitus without complications: Secondary | ICD-10-CM | POA: Diagnosis not present

## 2015-03-03 DIAGNOSIS — I10 Essential (primary) hypertension: Secondary | ICD-10-CM | POA: Diagnosis not present

## 2015-03-04 DIAGNOSIS — Z96652 Presence of left artificial knee joint: Secondary | ICD-10-CM | POA: Diagnosis not present

## 2015-03-04 DIAGNOSIS — F329 Major depressive disorder, single episode, unspecified: Secondary | ICD-10-CM | POA: Diagnosis not present

## 2015-03-04 DIAGNOSIS — Z471 Aftercare following joint replacement surgery: Secondary | ICD-10-CM | POA: Diagnosis not present

## 2015-03-04 DIAGNOSIS — Z7982 Long term (current) use of aspirin: Secondary | ICD-10-CM | POA: Diagnosis not present

## 2015-03-04 DIAGNOSIS — I10 Essential (primary) hypertension: Secondary | ICD-10-CM | POA: Diagnosis not present

## 2015-03-04 DIAGNOSIS — E119 Type 2 diabetes mellitus without complications: Secondary | ICD-10-CM | POA: Diagnosis not present

## 2015-03-06 DIAGNOSIS — Z471 Aftercare following joint replacement surgery: Secondary | ICD-10-CM | POA: Diagnosis not present

## 2015-03-06 DIAGNOSIS — F329 Major depressive disorder, single episode, unspecified: Secondary | ICD-10-CM | POA: Diagnosis not present

## 2015-03-06 DIAGNOSIS — I10 Essential (primary) hypertension: Secondary | ICD-10-CM | POA: Diagnosis not present

## 2015-03-06 DIAGNOSIS — E119 Type 2 diabetes mellitus without complications: Secondary | ICD-10-CM | POA: Diagnosis not present

## 2015-03-06 DIAGNOSIS — Z7982 Long term (current) use of aspirin: Secondary | ICD-10-CM | POA: Diagnosis not present

## 2015-03-06 DIAGNOSIS — Z96652 Presence of left artificial knee joint: Secondary | ICD-10-CM | POA: Diagnosis not present

## 2015-03-09 DIAGNOSIS — Z471 Aftercare following joint replacement surgery: Secondary | ICD-10-CM | POA: Diagnosis not present

## 2015-03-09 DIAGNOSIS — F329 Major depressive disorder, single episode, unspecified: Secondary | ICD-10-CM | POA: Diagnosis not present

## 2015-03-09 DIAGNOSIS — E119 Type 2 diabetes mellitus without complications: Secondary | ICD-10-CM | POA: Diagnosis not present

## 2015-03-09 DIAGNOSIS — Z7982 Long term (current) use of aspirin: Secondary | ICD-10-CM | POA: Diagnosis not present

## 2015-03-09 DIAGNOSIS — Z96652 Presence of left artificial knee joint: Secondary | ICD-10-CM | POA: Diagnosis not present

## 2015-03-09 DIAGNOSIS — I10 Essential (primary) hypertension: Secondary | ICD-10-CM | POA: Diagnosis not present

## 2015-03-11 DIAGNOSIS — I10 Essential (primary) hypertension: Secondary | ICD-10-CM | POA: Diagnosis not present

## 2015-03-11 DIAGNOSIS — F329 Major depressive disorder, single episode, unspecified: Secondary | ICD-10-CM | POA: Diagnosis not present

## 2015-03-11 DIAGNOSIS — Z96652 Presence of left artificial knee joint: Secondary | ICD-10-CM | POA: Diagnosis not present

## 2015-03-11 DIAGNOSIS — E119 Type 2 diabetes mellitus without complications: Secondary | ICD-10-CM | POA: Diagnosis not present

## 2015-03-11 DIAGNOSIS — Z7982 Long term (current) use of aspirin: Secondary | ICD-10-CM | POA: Diagnosis not present

## 2015-03-11 DIAGNOSIS — Z471 Aftercare following joint replacement surgery: Secondary | ICD-10-CM | POA: Diagnosis not present

## 2015-03-13 DIAGNOSIS — Z471 Aftercare following joint replacement surgery: Secondary | ICD-10-CM | POA: Diagnosis not present

## 2015-03-13 DIAGNOSIS — E119 Type 2 diabetes mellitus without complications: Secondary | ICD-10-CM | POA: Diagnosis not present

## 2015-03-13 DIAGNOSIS — I10 Essential (primary) hypertension: Secondary | ICD-10-CM | POA: Diagnosis not present

## 2015-03-13 DIAGNOSIS — F329 Major depressive disorder, single episode, unspecified: Secondary | ICD-10-CM | POA: Diagnosis not present

## 2015-03-13 DIAGNOSIS — Z96652 Presence of left artificial knee joint: Secondary | ICD-10-CM | POA: Diagnosis not present

## 2015-03-13 DIAGNOSIS — Z7982 Long term (current) use of aspirin: Secondary | ICD-10-CM | POA: Diagnosis not present

## 2015-03-19 IMAGING — CR DG CHEST 2V
2 series · 2 of 2 positions shown · non-contrast
Comparison: 04/28/2014

CLINICAL DATA: Preoperative evaluation for TAVR

EXAM:
CHEST  2 VIEW

[w chest pa]
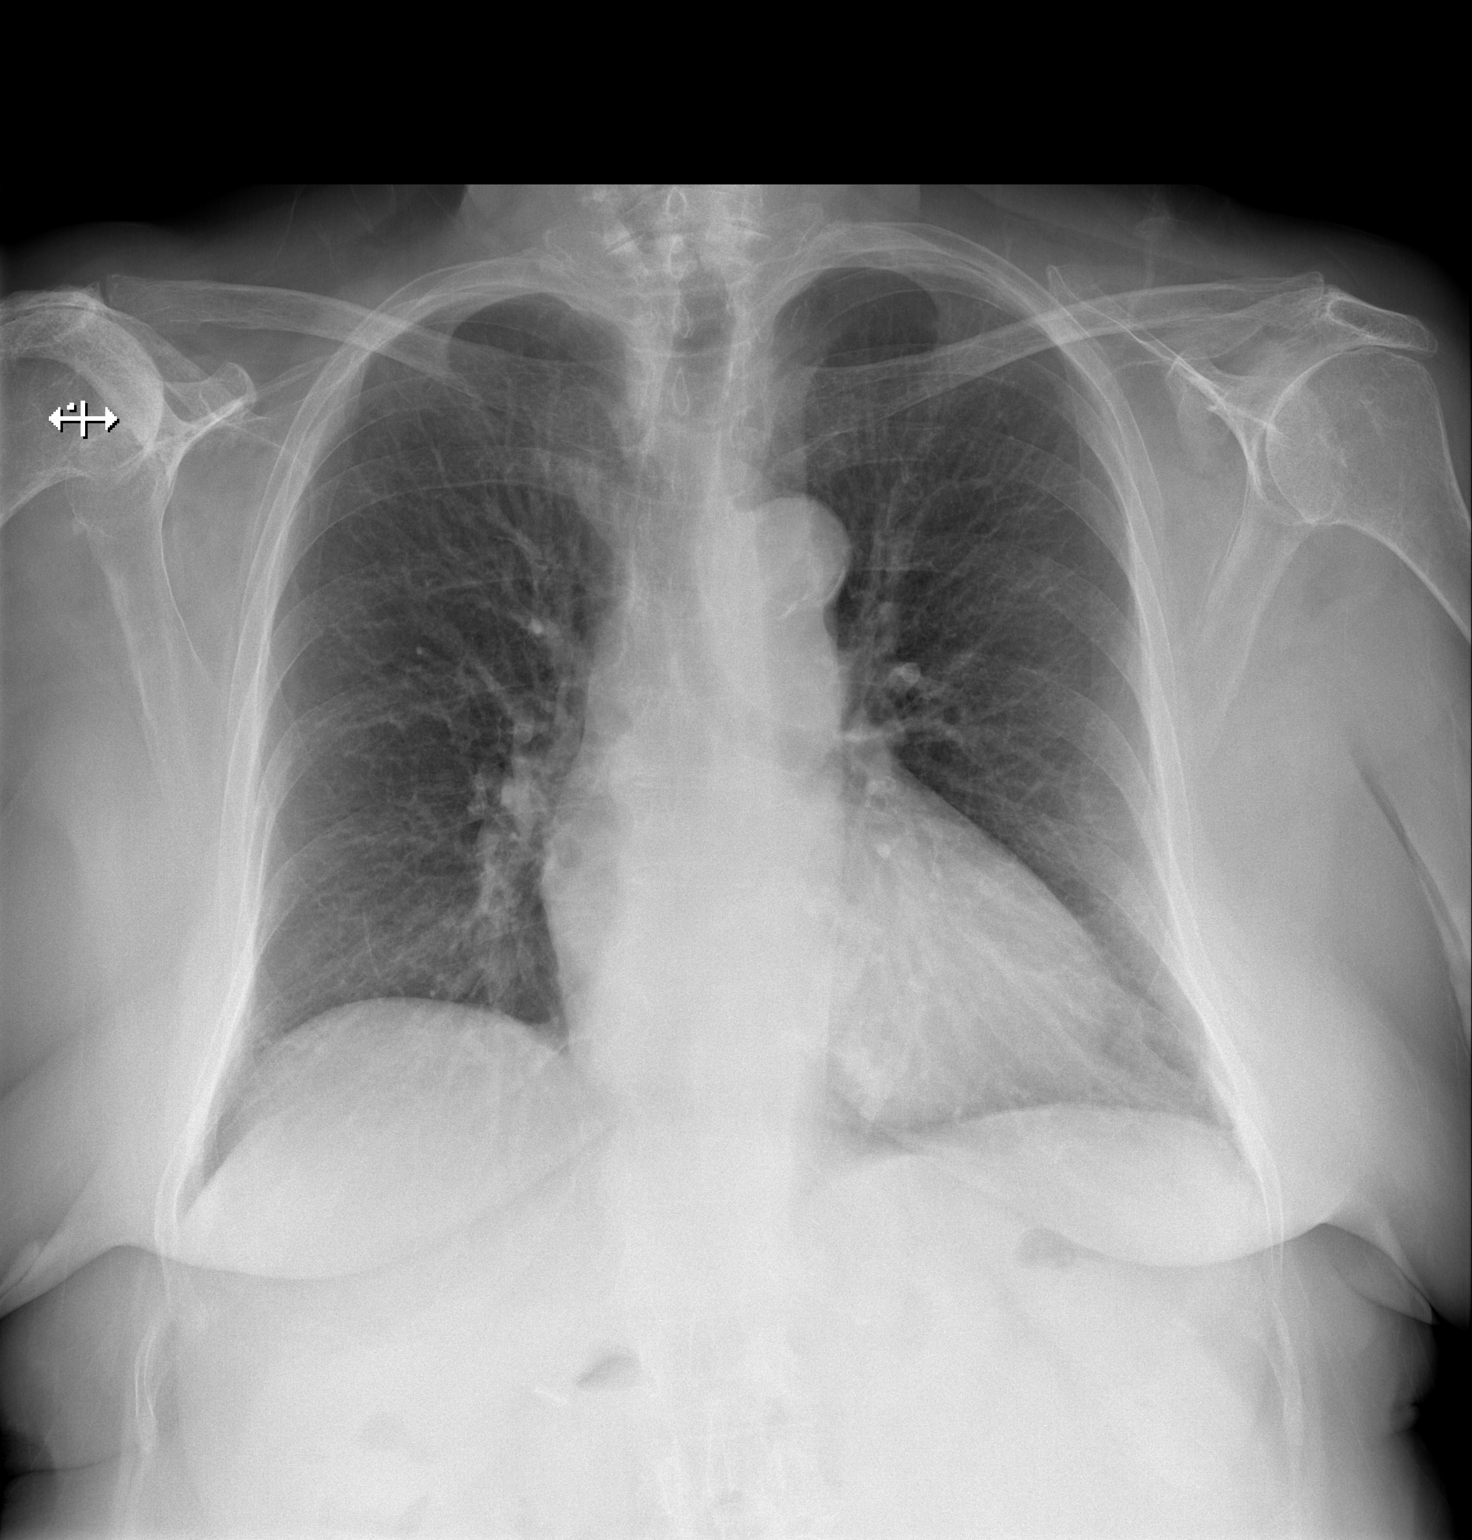

[w chest lat]
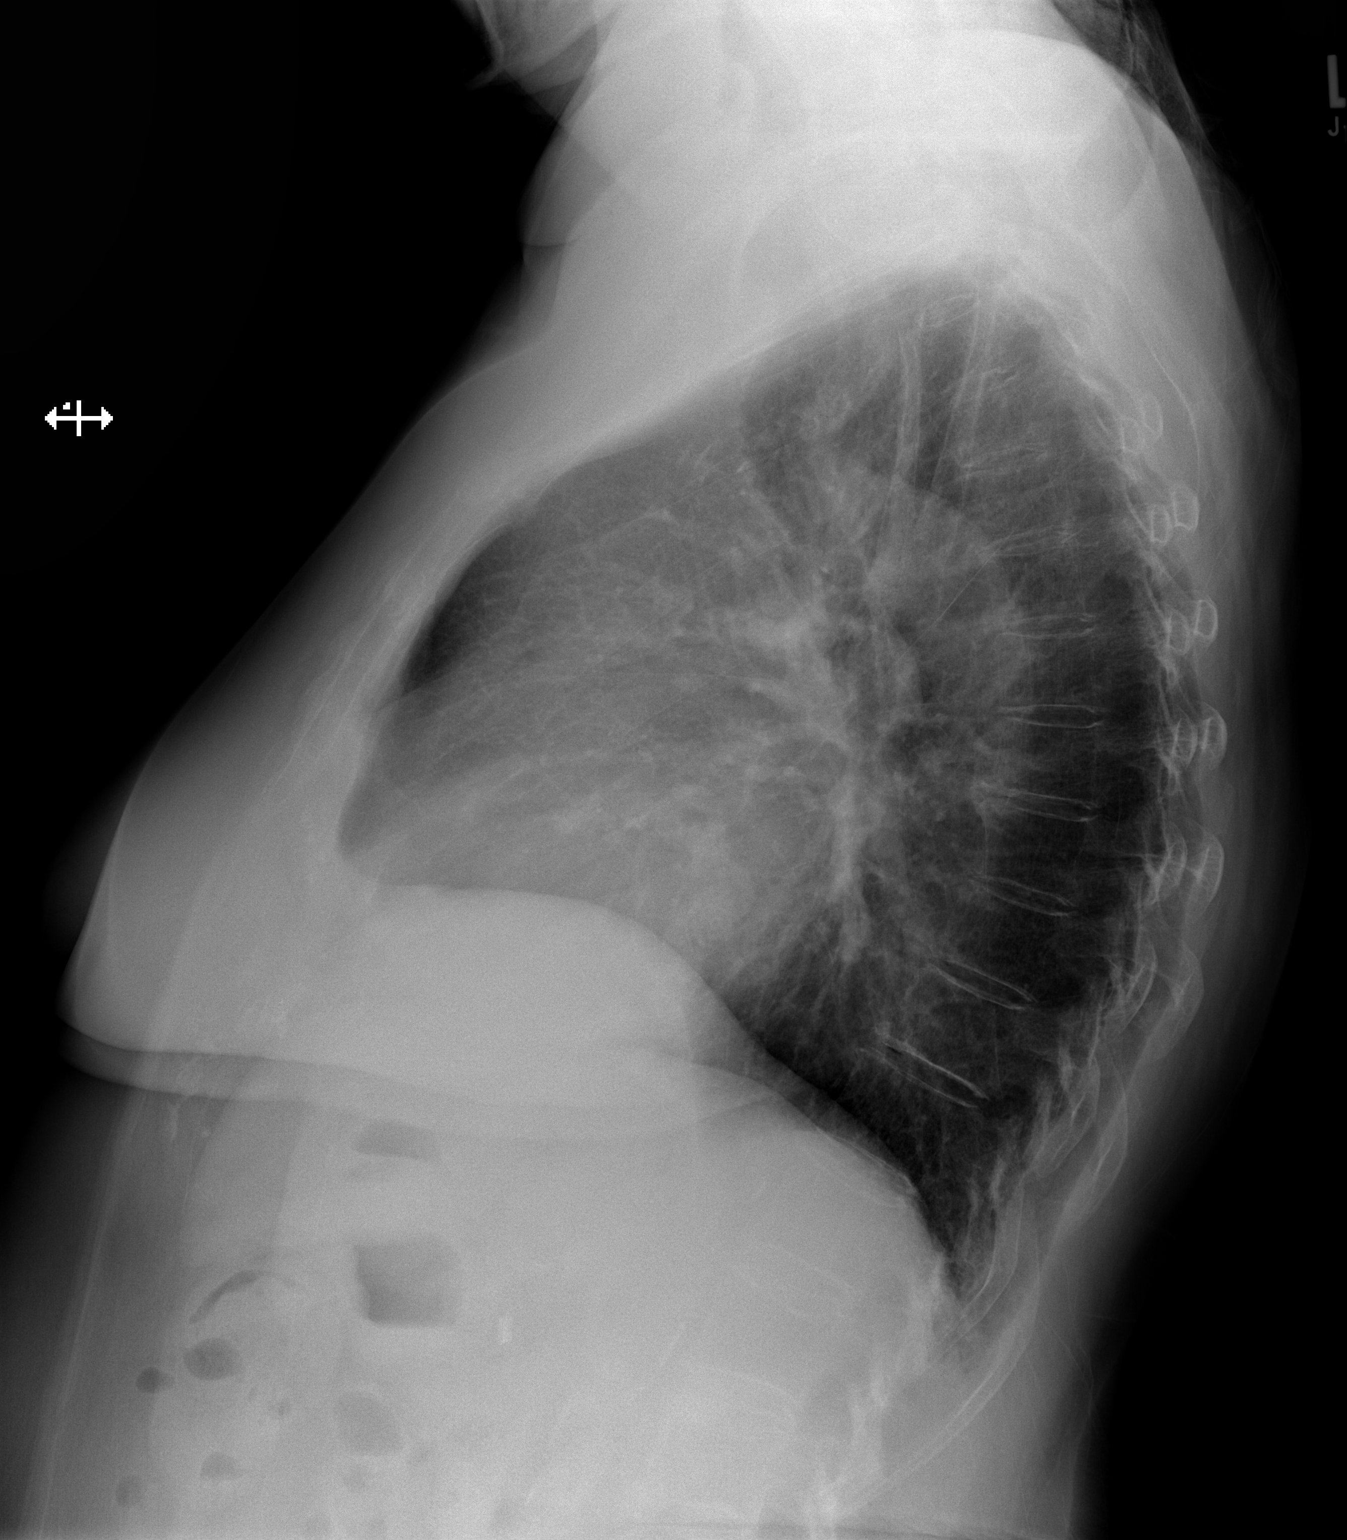

[2 of 2 positions shown; findings below may reference images not displayed]

FINDINGS: Cardiac shadow is prominent but stable from the prior exam. The
previously seen vascular congestion has resolved in the interval. No
focal infiltrate or effusion is seen. No acute bony abnormality is
noted. Degenerative changes about the shoulder joints are noted.
IMPRESSION: No active cardiopulmonary disease.

## 2015-03-23 DIAGNOSIS — Z96652 Presence of left artificial knee joint: Secondary | ICD-10-CM | POA: Diagnosis not present

## 2015-03-23 DIAGNOSIS — R262 Difficulty in walking, not elsewhere classified: Secondary | ICD-10-CM | POA: Diagnosis not present

## 2015-03-23 DIAGNOSIS — M25562 Pain in left knee: Secondary | ICD-10-CM | POA: Diagnosis not present

## 2015-03-26 DIAGNOSIS — R262 Difficulty in walking, not elsewhere classified: Secondary | ICD-10-CM | POA: Diagnosis not present

## 2015-03-26 DIAGNOSIS — Z96652 Presence of left artificial knee joint: Secondary | ICD-10-CM | POA: Diagnosis not present

## 2015-03-26 DIAGNOSIS — M25562 Pain in left knee: Secondary | ICD-10-CM | POA: Diagnosis not present

## 2015-03-31 DIAGNOSIS — Z96652 Presence of left artificial knee joint: Secondary | ICD-10-CM | POA: Diagnosis not present

## 2015-03-31 DIAGNOSIS — M25562 Pain in left knee: Secondary | ICD-10-CM | POA: Diagnosis not present

## 2015-03-31 DIAGNOSIS — R262 Difficulty in walking, not elsewhere classified: Secondary | ICD-10-CM | POA: Diagnosis not present

## 2015-03-31 IMAGING — CR DG CHEST 1V PORT
1 series · 1 of 1 positions shown · non-contrast
Comparison: 06/18/2014

CLINICAL DATA: Postop aortic stenosis.

EXAM:
PORTABLE CHEST - 1 VIEW

[AP]
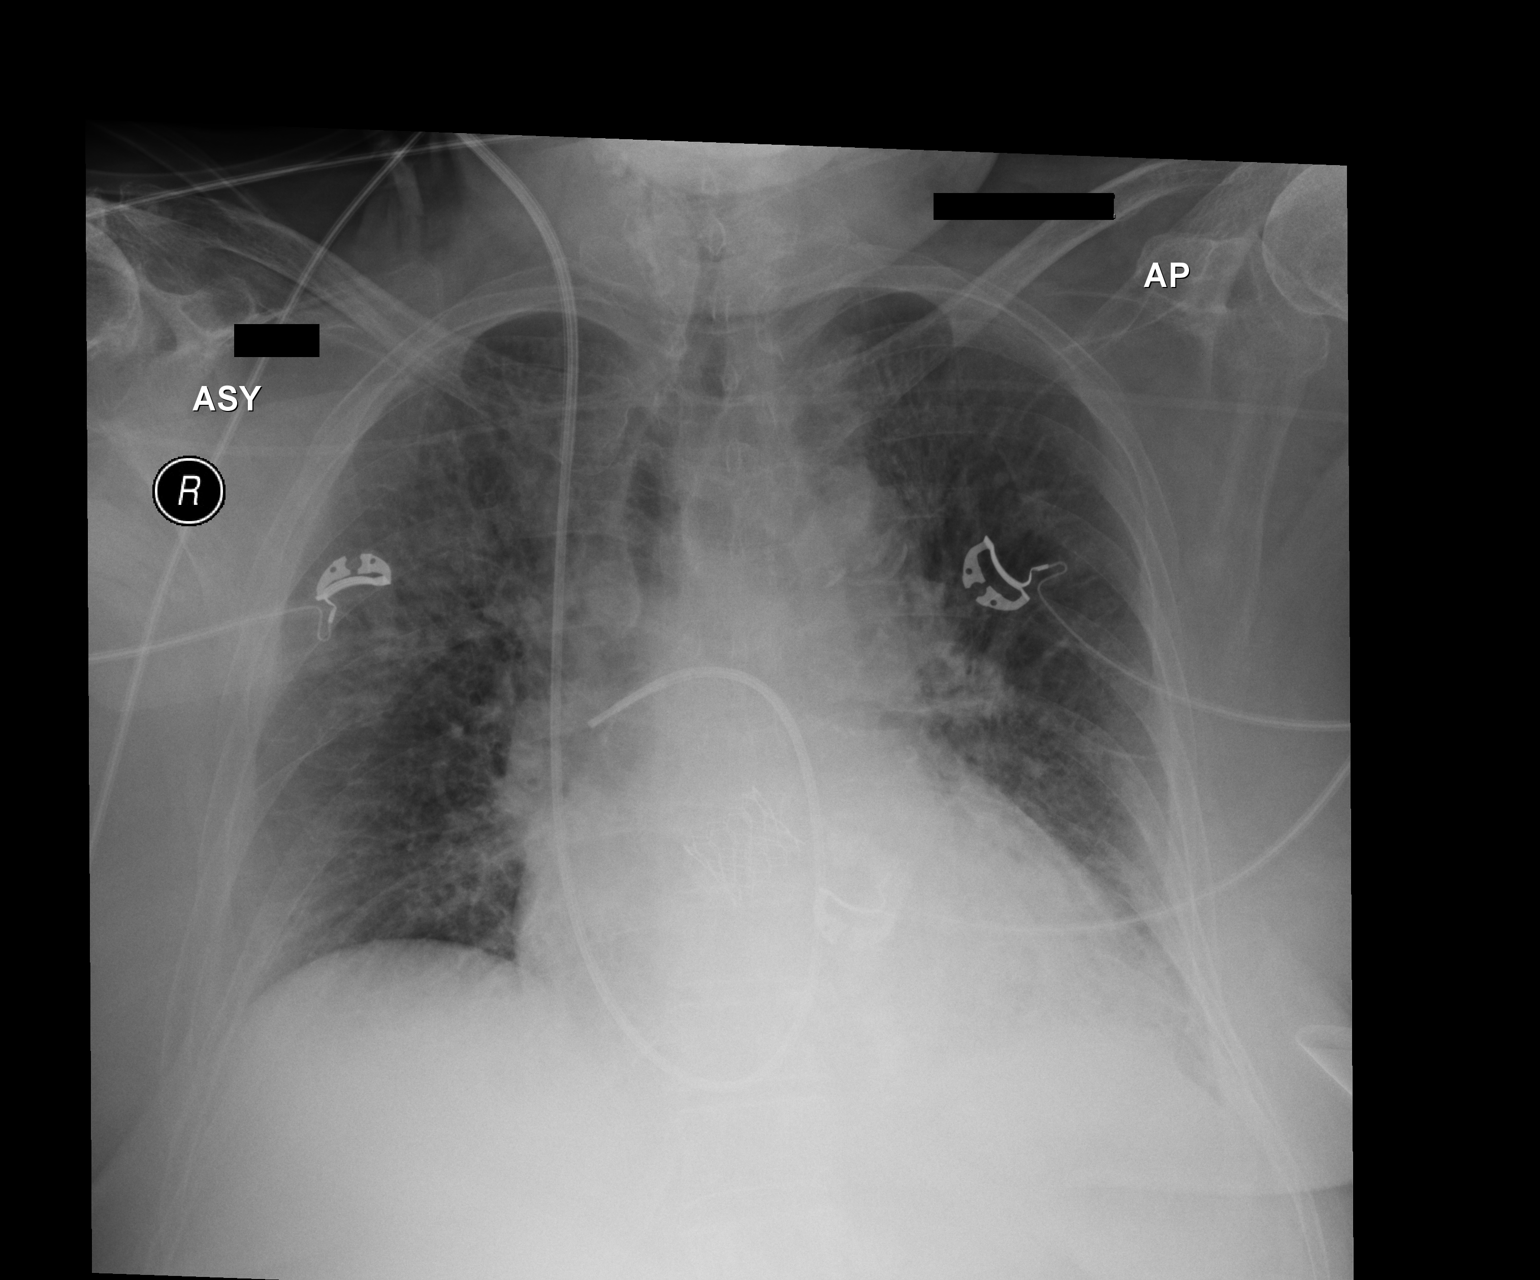

[1 of 1 positions shown; findings below may reference images not displayed]

FINDINGS: Changes of aortic replacement/repair. Right Swan-Ganz catheter tip
is in the main right pulmonary artery. No pneumothorax. Left base
atelectasis. No effusions. No acute bony abnormality.
IMPRESSION: Postoperative changes.  No pneumothorax.

Left base atelectasis.

## 2015-04-01 IMAGING — CR DG CHEST 1V PORT
1 series · 1 of 1 positions shown · non-contrast
Comparison: 06/30/2014 and earlier.

CLINICAL DATA: 81-year-old female with aortic stenosis status post
repair. Initial encounter.

EXAM:
PORTABLE CHEST - 1 VIEW

[AP]
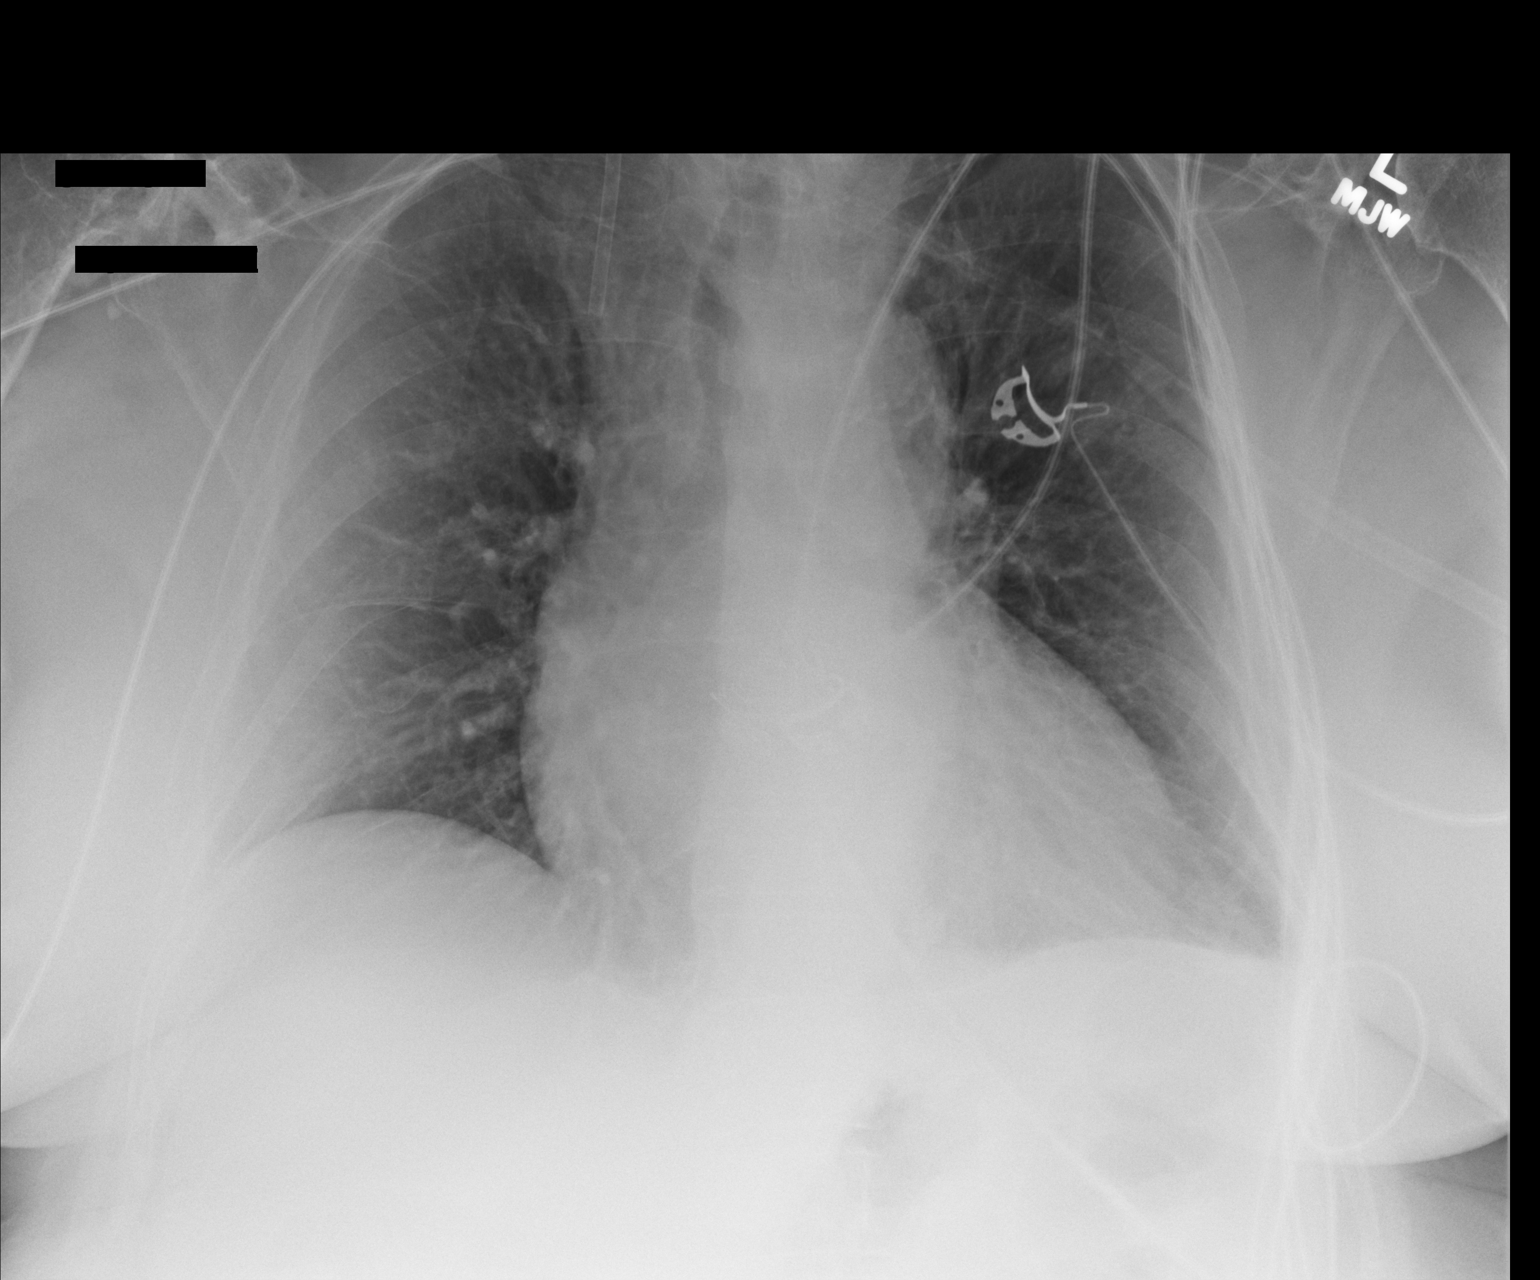

[1 of 1 positions shown; findings below may reference images not displayed]

FINDINGS: Portable AP upright view at 1454 hr. Right IJ approach Swan-Ganz
catheter is been removed. Introducer sheath remains. Stable cardiac
size and mediastinal contours. Larger lung volumes. No pneumothorax
or pulmonary edema. No consolidation. Possible small bilateral
effusions. Aortic valve implant or prosthesis re- identified.
IMPRESSION: 1. Swan-Ganz catheter removed, right IJ introducer sheath remains.
2. Improved lung volumes and ventilation.  Possible small effusions.

## 2015-04-03 DIAGNOSIS — Z96652 Presence of left artificial knee joint: Secondary | ICD-10-CM | POA: Diagnosis not present

## 2015-04-03 DIAGNOSIS — R262 Difficulty in walking, not elsewhere classified: Secondary | ICD-10-CM | POA: Diagnosis not present

## 2015-04-03 DIAGNOSIS — M25562 Pain in left knee: Secondary | ICD-10-CM | POA: Diagnosis not present

## 2015-04-07 DIAGNOSIS — Z96652 Presence of left artificial knee joint: Secondary | ICD-10-CM | POA: Diagnosis not present

## 2015-04-07 DIAGNOSIS — M25562 Pain in left knee: Secondary | ICD-10-CM | POA: Diagnosis not present

## 2015-04-07 DIAGNOSIS — R262 Difficulty in walking, not elsewhere classified: Secondary | ICD-10-CM | POA: Diagnosis not present

## 2015-04-09 DIAGNOSIS — Z96652 Presence of left artificial knee joint: Secondary | ICD-10-CM | POA: Diagnosis not present

## 2015-04-16 DIAGNOSIS — E785 Hyperlipidemia, unspecified: Secondary | ICD-10-CM | POA: Diagnosis not present

## 2015-04-16 DIAGNOSIS — Z952 Presence of prosthetic heart valve: Secondary | ICD-10-CM | POA: Diagnosis not present

## 2015-04-16 DIAGNOSIS — E668 Other obesity: Secondary | ICD-10-CM | POA: Diagnosis not present

## 2015-04-16 DIAGNOSIS — I119 Hypertensive heart disease without heart failure: Secondary | ICD-10-CM | POA: Diagnosis not present

## 2015-04-24 DIAGNOSIS — I10 Essential (primary) hypertension: Secondary | ICD-10-CM | POA: Diagnosis not present

## 2015-04-24 DIAGNOSIS — Z139 Encounter for screening, unspecified: Secondary | ICD-10-CM | POA: Diagnosis not present

## 2015-04-24 DIAGNOSIS — Z0000 Encounter for general adult medical examination without abnormal findings: Secondary | ICD-10-CM | POA: Diagnosis not present

## 2015-04-24 DIAGNOSIS — E7800 Pure hypercholesterolemia, unspecified: Secondary | ICD-10-CM | POA: Diagnosis not present

## 2015-04-24 DIAGNOSIS — G4701 Insomnia due to medical condition: Secondary | ICD-10-CM | POA: Diagnosis not present

## 2015-04-24 DIAGNOSIS — E119 Type 2 diabetes mellitus without complications: Secondary | ICD-10-CM | POA: Diagnosis not present

## 2015-07-13 DIAGNOSIS — Z952 Presence of prosthetic heart valve: Secondary | ICD-10-CM | POA: Diagnosis not present

## 2015-07-13 DIAGNOSIS — I119 Hypertensive heart disease without heart failure: Secondary | ICD-10-CM | POA: Diagnosis not present

## 2015-07-13 DIAGNOSIS — E785 Hyperlipidemia, unspecified: Secondary | ICD-10-CM | POA: Diagnosis not present

## 2015-07-13 DIAGNOSIS — I350 Nonrheumatic aortic (valve) stenosis: Secondary | ICD-10-CM | POA: Diagnosis not present

## 2015-07-20 DIAGNOSIS — I10 Essential (primary) hypertension: Secondary | ICD-10-CM | POA: Diagnosis not present

## 2015-07-20 DIAGNOSIS — E7800 Pure hypercholesterolemia, unspecified: Secondary | ICD-10-CM | POA: Diagnosis not present

## 2015-07-20 DIAGNOSIS — Z139 Encounter for screening, unspecified: Secondary | ICD-10-CM | POA: Diagnosis not present

## 2015-07-20 DIAGNOSIS — E119 Type 2 diabetes mellitus without complications: Secondary | ICD-10-CM | POA: Diagnosis not present

## 2015-07-21 DIAGNOSIS — G4701 Insomnia due to medical condition: Secondary | ICD-10-CM | POA: Diagnosis not present

## 2015-07-21 DIAGNOSIS — E119 Type 2 diabetes mellitus without complications: Secondary | ICD-10-CM | POA: Diagnosis not present

## 2015-07-21 DIAGNOSIS — I10 Essential (primary) hypertension: Secondary | ICD-10-CM | POA: Diagnosis not present

## 2015-07-23 DIAGNOSIS — H6693 Otitis media, unspecified, bilateral: Secondary | ICD-10-CM | POA: Diagnosis not present

## 2015-07-23 DIAGNOSIS — H6122 Impacted cerumen, left ear: Secondary | ICD-10-CM | POA: Diagnosis not present

## 2015-07-31 ENCOUNTER — Ambulatory Visit (INDEPENDENT_AMBULATORY_CARE_PROVIDER_SITE_OTHER): Payer: Medicare Other | Admitting: Cardiovascular Disease

## 2015-07-31 ENCOUNTER — Encounter: Payer: Self-pay | Admitting: Cardiovascular Disease

## 2015-07-31 VITALS — BP 128/70 | HR 56 | Ht 64.0 in | Wt 186.2 lb

## 2015-07-31 DIAGNOSIS — I359 Nonrheumatic aortic valve disorder, unspecified: Secondary | ICD-10-CM | POA: Diagnosis not present

## 2015-07-31 DIAGNOSIS — Z954 Presence of other heart-valve replacement: Secondary | ICD-10-CM | POA: Diagnosis not present

## 2015-07-31 DIAGNOSIS — Z952 Presence of prosthetic heart valve: Secondary | ICD-10-CM

## 2015-07-31 NOTE — Patient Instructions (Signed)
Medication Instructions:  Your physician recommends that you continue on your current medications as directed. Please refer to the Current Medication list given to you today.  Labwork: No new orders.   Testing/Procedures: No new orders.   Follow-Up: Your physician recommends that you continue Cardiology follow-up with Dr Donnie Aho.   Any Other Special Instructions Will Be Listed Below (If Applicable).     If you need a refill on your cardiac medications before your next appointment, please call your pharmacy.

## 2015-08-01 NOTE — Progress Notes (Signed)
Cardiology Office Note Date:  08/01/2015   ID:  Lori Gilbert, DOB Aug 30, 1932, MRN [id number]  PCP:  Marin Comment, FNP  Cardiologist:  [name], MD    Chief Complaint  Patient presents with  . Hypertension    denies cp, sob, le edema. c/o LEFT knee pain due to recent surgery  . Coronary Artery Disease     History of Present Illness: Lori Gilbert is a 82 y.o. female who presents for one year TAVR follow-up. She underwent TAVR with a 23 mm Sapien 3 valve 06/30/2014 via an open transfemoral approach. She had an uncomplicated post-operative course and continues to do well. Her breathing remains significantly improved since TAVR and she denies sx's of chest pain, lightheadedness, or syncope. She is modestly active and able to do more than she could previously do. She follows with Dr Donnie Aho regularly.   Past Medical History  Diagnosis Date  . H/O hiatal hernia   . CAD (coronary artery disease), native coronary artery     Mild distal left main, 30-40% LAD, 20% RCA, normal circ at cath 04/23/14 Dr. Excell Seltzer   . Hypertensive heart disease 03/25/2014  . Hypertrophic obstructive cardiomyopathy (HCC) 03/25/2014  . Aortic stenosis     a. s/p TAVR 06/30/14  . Hyperlipidemia     takes Pravastatin daily  . Hypertension     takes Diltiazem and Cozaar daily  . Aortic stenosis     severe   . History of bronchitis     couple of yrs ago  . Arthritis   . Joint pain   . GERD (gastroesophageal reflux disease) 03/25/2014    takes Omeprazole daily  . Nocturia   . Anemia     takes Iron daily  . Diabetes mellitus without complication (HCC)     takes Metformin and Amaryl daily  . History of shingles   . S/P TAVR (transcatheter aortic valve replacement) 06/30/2014    23 mm Edwards Sapien 3 transcatheter heart valve placed via open right transfemoral approach    Past Surgical History  Procedure Laterality Date  . Cholecystectomy    . Dilation and curettage of uterus    . Ankle fracture  surgery Bilateral   . Wrist fracture surgery Left   . Carpal tunnel release Right 05/13/2013    Procedure: RIGHT CARPAL TUNNEL RELEASE;  Surgeon: Thera Flake., MD;  Location: [facility];  Service: Orthopedics;  Laterality: Right;  . Left and right heart catheterization with coronary angiogram N/A 04/23/2014    Procedure: LEFT AND RIGHT HEART CATHETERIZATION WITH CORONARY ANGIOGRAM;  Surgeon: Micheline Chapman, MD;  Location: [facility] CATH LAB;  Service: Cardiovascular;  Laterality: N/A;  . Tee without cardioversion N/A 05/02/2014    Procedure: TRANSESOPHAGEAL ECHOCARDIOGRAM (TEE);  Surgeon: Wendall Stade, MD;  Location: [facility] ENDOSCOPY;  Service: Cardiovascular;  Laterality: N/A;  . Colonoscopy    . Cataract surgery Bilateral   . Transcatheter aortic valve replacement, transfemoral N/A 06/30/2014    Procedure: TRANSCATHETER AORTIC VALVE REPLACEMENT, TRANSFEMORAL;  Surgeon: [name], MD;  Location: [facility] OR;  Service: Open Heart Surgery;  Laterality: N/A;  . Tee without cardioversion N/A 06/30/2014    Procedure: TRANSESOPHAGEAL ECHOCARDIOGRAM (TEE);  Surgeon: [name], MD;  Location: [facility] OR;  Service: Open Heart Surgery;  Laterality: N/A;    Current Outpatient Prescriptions  Medication Sig Dispense Refill  . aspirin 81 MG chewable tablet Chew 1 tablet (81 mg total) by mouth daily. 30 tablet   .  diltiazem (DILACOR XR) 240 MG 24 hr capsule Take 240 mg by mouth daily.    . furosemide (LASIX) 20 MG tablet Take 20 mg by mouth daily.     Marland Kitchen glimepiride (AMARYL) 1 MG tablet Take 1 mg by mouth daily with breakfast.    . IRON PO Take 1 tablet by mouth daily.    Marland Kitchen levocetirizine (XYZAL) 5 MG tablet Take 5 mg by mouth every evening.   2  . losartan (COZAAR) 100 MG tablet Take 100 mg by mouth daily.  3  . metFORMIN (GLUCOPHAGE) 500 MG tablet Take 500 mg by mouth 2 (two) times daily with a meal.     . metoprolol tartrate (LOPRESSOR) 25 MG tablet Take 0.5 tablets (12.5 mg total) by mouth 2 (two)  times daily. 60 tablet 11  . pantoprazole (PROTONIX) 40 MG tablet Take 1 tablet (40 mg total) by mouth daily. 30 tablet 11  . potassium chloride (MICRO-K) 10 MEQ CR capsule Take 10 mEq by mouth daily.  2  . pravastatin (PRAVACHOL) 40 MG tablet Take 40 mg by mouth daily.    . sertraline (ZOLOFT) 100 MG tablet Take 100 mg by mouth daily.    . traZODone (DESYREL) 50 MG tablet Take 50 mg by mouth at bedtime.      No current facility-administered medications for this visit.    Allergies:   Codeine   Social History:  The patient  reports that she has never smoked. She has never used smokeless tobacco. She reports that she does not drink alcohol or use illicit drugs.   Family History:  The patient's  family history is not on file.    ROS:  Please see the history of present illness.  All other systems are reviewed and negative.    PHYSICAL EXAM: VS:  BP 128/70 mmHg  Pulse 56  Ht  (1.626 m)  Wt 186 lb 3.2 oz (84.46 kg)  BMI 31.95 kg/m2 , BMI Body mass index is 31.95 kg/(m^2). GEN: Well nourished, well developed, in no acute distress HEENT: normal Neck: no JVD, no masses.  Cardiac: brady and regular with 2/6 SEM at the RUSB             Respiratory:  clear to auscultation bilaterally, normal work of breathing GI: soft, nontender, nondistended, + BS MS: no deformity or atrophy Ext: trace pretibial edema Skin: warm and dry, no rash Neuro:  Strength and sensation are intact Psych: euthymic mood, full affect  EKG:  EKG is ordered today. The ekg ordered today shows sinus brady 50 bpm, RBBB  Recent Labs: No results found for requested labs within last 365 days.   Lipid Panel  No results found for: CHOL, TRIG, HDL, CHOLHDL, VLDL, LDLCALC, LDLDIRECT    Wt Readings from Last 3 Encounters:  07/31/15 186 lb 3.2 oz (84.46 kg)  08/06/14 185 lb 1.9 oz (83.97 kg)  07/09/14 186 lb (84.369 kg)    ASSESSMENT AND PLAN: Aortic valve disorder s/p TAVR: Pt continues to do very well, now one  year out from TAVR with NYHA I symptoms. I have reviewed her echo images from Dr York Spaniel office. This shows vigorous LV systolic function with LVEF > 96%, mean and peak transaortic velocities of 14 and 27 mmHg, respectively, mild paravalvular AI, and a calculated AVA of 1.94 square cm. She stopped plavix per recommendations at 6 months and continues on ASA 81 mg. She understands to follow SBE prophylaxis lifelong. I will be happy to see her  back as needed in the future.   Current medicines are reviewed with the patient today.  The patient does not have concerns regarding medicines.  Labs/ tests ordered today include:  No orders of the defined types were placed in this encounter.   Disposition:   FU as needed  Signed, [name], MD  08/01/2015 7:28 AM    [facility] [address] [place], [place], [place]  [address] Phone: [phone]; Fax: [phone]

## 2015-08-19 NOTE — Addendum Note (Signed)
Addended by: Reesa Chew on: 08/19/2015 05:41 PM   Modules accepted: Orders

## 2018-02-12 ENCOUNTER — Ambulatory Visit: Payer: Self-pay | Admitting: Cardiology

## 2018-03-13 ENCOUNTER — Encounter: Payer: Self-pay | Admitting: Cardiology

## 2018-03-13 ENCOUNTER — Ambulatory Visit: Payer: Medicare Other | Admitting: Cardiology

## 2018-03-13 ENCOUNTER — Encounter: Payer: Self-pay | Admitting: Emergency Medicine

## 2018-03-13 VITALS — BP 118/66 | HR 59 | Ht 64.0 in | Wt 188.0 lb

## 2018-03-13 DIAGNOSIS — I2510 Atherosclerotic heart disease of native coronary artery without angina pectoris: Secondary | ICD-10-CM | POA: Diagnosis not present

## 2018-03-13 DIAGNOSIS — E782 Mixed hyperlipidemia: Secondary | ICD-10-CM | POA: Diagnosis not present

## 2018-03-13 DIAGNOSIS — Z952 Presence of prosthetic heart valve: Secondary | ICD-10-CM

## 2018-03-13 DIAGNOSIS — E119 Type 2 diabetes mellitus without complications: Secondary | ICD-10-CM

## 2018-03-13 NOTE — Progress Notes (Signed)
Cardiology Office Note:    Date:  03/13/2018   ID:  Lori Gilbert, DOB 1932/12/03, MRN [id number]  PCP:  Marin Comment, FNP  Cardiologist:  Gypsy Balsam, MD    Referring MD: Marin Comment, FNP   Chief Complaint  Patient presents with  . Follow-up  Doing well  History of Present Illness:    Lori Gilbert is a 84 y.o. female with aortic valve replacement with TAVR few years ago done by Dr. Excell Seltzer, also nonobstructive coronary artery disease by cardiac catheterization from 2015, hypertension, diabetes, dyslipidemia she is patient of Dr. Arlyn Leak comes today to office for follow-up overall seems to be doing well she honestly stated that she does not do much and she understands she needs to be more.  She can walk climb stairs with some fatigue and tiredness but no tightness squeezing pressure burning chest no shortness of breath overall considering her level of activity she is doing well  Past Medical History:  Diagnosis Date  . Anemia    takes Iron daily  . Aortic stenosis    a. s/p TAVR 06/30/14  . Aortic stenosis    severe   . Arthritis   . CAD (coronary artery disease), native coronary artery    Mild distal left main, 30-40% LAD, 20% RCA, normal circ at cath 04/23/14 Dr. Excell Seltzer   . Diabetes mellitus without complication (HCC)    takes Metformin and Amaryl daily  . GERD (gastroesophageal reflux disease) 03/25/2014   takes Omeprazole daily  . H/O hiatal hernia   . History of bronchitis    couple of yrs ago  . History of shingles   . Hyperlipidemia    takes Pravastatin daily  . Hypertension    takes Diltiazem and Cozaar daily  . Hypertensive heart disease 03/25/2014  . Hypertrophic obstructive cardiomyopathy (HCC) 03/25/2014  . Joint pain   . Nocturia   . S/P TAVR (transcatheter aortic valve replacement) 06/30/2014   23 mm Edwards Sapien 3 transcatheter heart valve placed via open right transfemoral approach    Past Surgical History:  Procedure Laterality Date  . ANKLE  FRACTURE SURGERY Bilateral   . CARPAL TUNNEL RELEASE Right 05/13/2013   Procedure: RIGHT CARPAL TUNNEL RELEASE;  Surgeon: Thera Flake., MD;  Location: [facility];  Service: Orthopedics;  Laterality: Right;  . cataract surgery Bilateral   . CHOLECYSTECTOMY    . COLONOSCOPY    . DILATION AND CURETTAGE OF UTERUS    . LEFT AND RIGHT HEART CATHETERIZATION WITH CORONARY ANGIOGRAM N/A 04/23/2014   Procedure: LEFT AND RIGHT HEART CATHETERIZATION WITH CORONARY ANGIOGRAM;  Surgeon: Micheline Chapman, MD;  Location: [facility] CATH LAB;  Service: Cardiovascular;  Laterality: N/A;  . TEE WITHOUT CARDIOVERSION N/A 05/02/2014   Procedure: TRANSESOPHAGEAL ECHOCARDIOGRAM (TEE);  Surgeon: Wendall Stade, MD;  Location: [facility] ENDOSCOPY;  Service: Cardiovascular;  Laterality: N/A;  . TEE WITHOUT CARDIOVERSION N/A 06/30/2014   Procedure: TRANSESOPHAGEAL ECHOCARDIOGRAM (TEE);  Surgeon: [name], MD;  Location: [facility] OR;  Service: Open Heart Surgery;  Laterality: N/A;  . TRANSCATHETER AORTIC VALVE REPLACEMENT, TRANSFEMORAL N/A 06/30/2014   Procedure: TRANSCATHETER AORTIC VALVE REPLACEMENT, TRANSFEMORAL;  Surgeon: [name], MD;  Location: [facility] OR;  Service: Open Heart Surgery;  Laterality: N/A;  . WRIST FRACTURE SURGERY Left     Current Medications: Current Meds  Medication Sig  . diltiazem (DILACOR XR) 240 MG 24 hr capsule Take 240 mg by mouth daily.  . furosemide (LASIX) 20 MG tablet Take 20  mg by mouth daily.   Marland Kitchen glimepiride (AMARYL) 1 MG tablet Take 1 mg by mouth daily with breakfast.  . ibuprofen (ADVIL,MOTRIN) 200 MG tablet Take 200 mg by mouth as needed.  Marland Kitchen lisinopril (PRINIVIL,ZESTRIL) 20 MG tablet Take 20 mg by mouth daily.  . metFORMIN (GLUCOPHAGE) 500 MG tablet Take 500 mg by mouth 2 (two) times daily with a meal.   . metoprolol tartrate (LOPRESSOR) 25 MG tablet Take 0.5 tablets (12.5 mg total) by mouth 2 (two) times daily.  . pantoprazole (PROTONIX) 40 MG tablet Take 1 tablet (40 mg total) by  mouth daily.  . potassium chloride (MICRO-K) 10 MEQ CR capsule Take 10 mEq by mouth daily.  . pravastatin (PRAVACHOL) 40 MG tablet Take 40 mg by mouth daily.  . sertraline (ZOLOFT) 100 MG tablet Take 100 mg by mouth daily.  . traZODone (DESYREL) 50 MG tablet Take 50 mg by mouth at bedtime.      Allergies:   Bextra [valdecoxib]; Monopril [fosinopril]; and Codeine   Social History   Socioeconomic History  . Marital status: Widowed    Spouse name: Not on file  . Number of children: Not on file  . Years of education: Not on file  . Highest education level: Not on file  Occupational History  . Not on file  Social Needs  . Financial resource strain: Not on file  . Food insecurity:    Worry: Not on file    Inability: Not on file  . Transportation needs:    Medical: Not on file    Non-medical: Not on file  Tobacco Use  . Smoking status: Never Smoker  . Smokeless tobacco: Never Used  Substance and Sexual Activity  . Alcohol use: No    Alcohol/week: 0.0 standard drinks  . Drug use: No  . Sexual activity: Never    Birth control/protection: Post-menopausal  Lifestyle  . Physical activity:    Days per week: Not on file    Minutes per session: Not on file  . Stress: Not on file  Relationships  . Social connections:    Talks on phone: Not on file    Gets together: Not on file    Attends religious service: Not on file    Active member of club or organization: Not on file    Attends meetings of clubs or organizations: Not on file    Relationship status: Not on file  Other Topics Concern  . Not on file  Social History Narrative  . Not on file     Family History: The patient's family history includes AAA (abdominal aortic aneurysm) in her brother; Coronary artery disease in her brother and brother; Diabetes in her brother and sister; Healthy in her mother and sister; Hodgkin's lymphoma in her father. ROS:   Please see the history of present illness.    All 14 point review of  systems negative except as described per history of present illness  EKGs/Labs/Other Studies Reviewed:      Recent Labs: No results found for requested labs within last 8760 hours.  Recent Lipid Panel No results found for: CHOL, TRIG, HDL, CHOLHDL, VLDL, LDLCALC, LDLDIRECT  Physical Exam:    VS:  BP 118/66   Pulse (!) 59   Ht 5\' 4"  (1.626 m)   Wt 188 lb (85.3 kg)   SpO2 96%   BMI 32.27 kg/m     Wt Readings from Last 3 Encounters:  03/13/18 188 lb (85.3 kg)  07/31/15 186 lb 3.2  oz (84.5 kg)  08/06/14 185 lb 1.9 oz (84 kg)     GEN:  Well nourished, well developed in no acute distress HEENT: Normal NECK: No JVD; No carotid bruits LYMPHATICS: No lymphadenopathy CARDIAC: RRR, no murmurs, no rubs, no gallops RESPIRATORY:  Clear to auscultation without rales, wheezing or rhonchi  ABDOMEN: Soft, non-tender, non-distended MUSCULOSKELETAL:  No edema; No deformity  SKIN: Warm and dry LOWER EXTREMITIES: no swelling NEUROLOGIC:  Alert and oriented x 3 PSYCHIATRIC:  Normal affect   ASSESSMENT:    1. S/P TAVR (transcatheter aortic valve replacement)   2. Mixed hyperlipidemia   3. Coronary artery disease involving native coronary artery of native heart without angina pectoris   4. Diabetes mellitus type 2, noninsulin dependent (HCC)    PLAN:    In order of problems listed above:  1. Status post TAVR time to repeat echocardiogram which I will schedule her to have.  She does have systolic murmur on auscultation but does not have any severe features. 2. Mixed dyslipidemia will contact her primary care physician to get fasting lipid profile. 3. Coronary artery disease asymptomatic. 4. Diabetes stable followed by primary care physicians.  Overall cardiac wise doing well we will do echocardiogram will see her back in 6 months.   Medication Adjustments/Labs and Tests Ordered: Current medicines are reviewed at length with the patient today.  Concerns regarding medicines are  outlined above.  No orders of the defined types were placed in this encounter.  Medication changes: No orders of the defined types were placed in this encounter.   Signed, Georgeanna Lea, MD, [facility] 03/13/2018 9:36 AM    [facility]

## 2018-03-13 NOTE — Patient Instructions (Signed)
Medication Instructions:  Your physician recommends that you continue on your current medications as directed. Please refer to the Current Medication list given to you today.  If you need a refill on your cardiac medications before your next appointment, please call your pharmacy.   Lab work: None.  If you have labs (blood work) drawn today and your tests are completely normal, you will receive your results only by: . MyChart Message (if you have MyChart) OR . A paper copy in the mail. If you have any lab test that is abnormal or we need to change your treatment, we will call you to review the results.  Testing/Procedures: Your physician has requested that you have an echocardiogram. Echocardiography is a painless test that uses sound waves to create images of your heart. It provides your doctor with information about the size and shape of your heart and how well your heart's chambers and valves are working. This procedure takes approximately one hour. There are no restrictions for this procedure.    Follow-Up: At [facility], you and your health needs are our priority.  As part of our continuing mission to provide you with exceptional heart care, we have created designated Provider Care Teams.  These Care Teams include your primary Cardiologist (physician) and Advanced Practice Providers (APPs -  Physician Assistants and Nurse Practitioners) who all work together to provide you with the care you need, when you need it. You will need a follow up appointment in 6 months.  Please call our office 2 months in advance to schedule this appointment.  You may see No primary care provider on file. or another member of our [facility] Provider Team in : Brian Munley, MD . Rajan Revankar, MD  Any Other Special Instructions Will Be Listed Below (If Applicable).  Echocardiogram An echocardiogram, or echocardiography, uses sound waves (ultrasound) to produce an image of your heart. The  echocardiogram is simple, painless, obtained within a short period of time, and offers valuable information to your health care provider. The images from an echocardiogram can provide information such as:  Evidence of coronary artery disease (CAD).  Heart size.  Heart muscle function.  Heart valve function.  Aneurysm detection.  Evidence of a past heart attack.  Fluid buildup around the heart.  Heart muscle thickening.  Assess heart valve function.  Tell a health care provider about:  Any allergies you have.  All medicines you are taking, including vitamins, herbs, eye drops, creams, and over-the-counter medicines.  Any problems you or family members have had with anesthetic medicines.  Any blood disorders you have.  Any surgeries you have had.  Any medical conditions you have.  Whether you are pregnant or may be pregnant. What happens before the procedure? No special preparation is needed. Eat and drink normally. What happens during the procedure?  In order to produce an image of your heart, gel will be applied to your chest and a wand-like tool (transducer) will be moved over your chest. The gel will help transmit the sound waves from the transducer. The sound waves will harmlessly bounce off your heart to allow the heart images to be captured in real-time motion. These images will then be recorded.  You may need an IV to receive a medicine that improves the quality of the pictures. What happens after the procedure? You may return to your normal schedule including diet, activities, and medicines, unless your health care provider tells you otherwise. This information is not intended to replace advice   given to you by your health care provider. Make sure you discuss any questions you have with your health care provider. Document Released: 04/08/2000 Document Revised: 11/28/2015 Document Reviewed: 12/17/2012 Elsevier Interactive Patient Education  2017 Elsevier  Inc.    

## 2018-07-12 ENCOUNTER — Other Ambulatory Visit: Payer: Self-pay

## 2018-08-15 ENCOUNTER — Telehealth: Payer: Self-pay | Admitting: Cardiology

## 2018-08-15 NOTE — Telephone Encounter (Signed)
Virtual Visit Pre-Appointment Phone Call  "(Name), I am calling you today to discuss your upcoming appointment. We are currently trying to limit exposure to the virus that causes COVID-19 by seeing patients at home rather than in the office."  1. "What is the BEST phone number to call the day of the visit?" - include this in appointment notes  2. Do you have or have access to (through a family member/friend) a smartphone with video capability that we can use for your visit?" a. If yes - list this number in appt notes as cell (if different from BEST phone #) and list the appointment type as a VIDEO visit in appointment notes b. If no - list the appointment type as a PHONE visit in appointment notes  3. Confirm consent - "In the setting of the current Covid19 crisis, you are scheduled for a (phone or video) visit with your provider on (date) at (time).  Just as we do with many in-office visits, in order for you to participate in this visit, we must obtain consent.  If you'd like, I can send this to your mychart (if signed up) or email for you to review.  Otherwise, I can obtain your verbal consent now.  All virtual visits are billed to your insurance company just like a normal visit would be.  By agreeing to a virtual visit, we'd like you to understand that the technology does not allow for your provider to perform an examination, and thus may limit your provider's ability to fully assess your condition. If your provider identifies any concerns that need to be evaluated in person, we will make arrangements to do so.  Finally, though the technology is pretty good, we cannot assure that it will always work on either your or our end, and in the setting of a video visit, we may have to convert it to a phone-only visit.  In either situation, we cannot ensure that we have a secure connection.  Are you willing to proceed?" STAFF: Did the patient verbally acknowledge consent to telehealth visit? Document  YES/NO here: YES  4. Advise patient to be prepared - "Two hours prior to your appointment, go ahead and check your blood pressure, pulse, oxygen saturation, and your weight (if you have the equipment to check those) and write them all down. When your visit starts, your provider will ask you for this information. If you have an Apple Watch or Kardia device, please plan to have heart rate information ready on the day of your appointment. Please have a pen and paper handy nearby the day of the visit as well."  5. Give patient instructions for MyChart download to smartphone OR Doximity/Doxy.me as below if video visit (depending on what platform provider is using)  6. Inform patient they will receive a phone call 15 minutes prior to their appointment time (may be from unknown caller ID) so they should be prepared to answer    TELEPHONE CALL NOTE  SHRINIKA POPICK has been deemed a candidate for a follow-up tele-health visit to limit community exposure during the Covid-19 pandemic. I spoke with the patient via phone to ensure availability of phone/video source, confirm preferred email & phone number, and discuss instructions and expectations.  I reminded CHARDE RENSLOW to be prepared with any vital sign and/or heart rhythm information that could potentially be obtained via home monitoring, at the time of her visit. I reminded LEANIE WUETHRICH to expect a phone call prior to  her visit.  Kittie Plater 08/15/2018 11:00 AM   INSTRUCTIONS FOR DOWNLOADING THE MYCHART APP TO SMARTPHONE  - The patient must first make sure to have activated MyChart and know their login information - If Apple, go to [employer] and type in MyChart in the search bar and download the app. If Android, ask patient to go to [employer] and type in [place] in the search bar and download the app. The app is free but as with any other app downloads, their phone may require them to verify saved payment information or  Apple/Android password.  - The patient will need to then log into the app with their MyChart username and password, and select [facility] as their healthcare provider to link the account. When it is time for your visit, go to the MyChart app, find appointments, and click Begin Video Visit. Be sure to Select Allow for your device to access the Microphone and Camera for your visit. You will then be connected, and your provider will be with you shortly.  **If they have any issues connecting, or need assistance please contact MyChart service desk [phone])**  **If using a computer, in order to ensure the best quality for their visit they will need to use either of the following Internet Browsers: [employer] or Google Chrome**  IF USING DOXIMITY or DOXY.ME - The patient will receive a link just prior to their visit by text.     FULL LENGTH CONSENT FOR TELE-HEALTH VISIT   I hereby voluntarily request, consent and authorize [facility] and its employed or contracted physicians, physician assistants, nurse practitioners or other licensed health care professionals (the Practitioner), to provide me with telemedicine health care services (the Services") as deemed necessary by the treating Practitioner. I acknowledge and consent to receive the Services by the Practitioner via telemedicine. I understand that the telemedicine visit will involve communicating with the Practitioner through live audiovisual communication technology and the disclosure of certain medical information by electronic transmission. I acknowledge that I have been given the opportunity to request an in-person assessment or other available alternative prior to the telemedicine visit and am voluntarily participating in the telemedicine visit.  I understand that I have the right to withhold or withdraw my consent to the use of telemedicine in the course of my care at any time, without affecting my right to future care  or treatment, and that the Practitioner or I may terminate the telemedicine visit at any time. I understand that I have the right to inspect all information obtained and/or recorded in the course of the telemedicine visit and may receive copies of available information for a reasonable fee.  I understand that some of the potential risks of receiving the Services via telemedicine include:   Delay or interruption in medical evaluation due to technological equipment failure or disruption;  Information transmitted may not be sufficient (e.g. poor resolution of images) to allow for appropriate medical decision making by the Practitioner; and/or   In rare instances, security protocols could fail, causing a breach of personal health information.  Furthermore, I acknowledge that it is my responsibility to provide information about my medical history, conditions and care that is complete and accurate to the best of my ability. I acknowledge that Practitioner's advice, recommendations, and/or decision may be based on factors not within their control, such as incomplete or inaccurate data provided by me or distortions of diagnostic images or specimens that may result from electronic transmissions. I  understand that the practice of medicine is not an exact science and that Practitioner makes no warranties or guarantees regarding treatment outcomes. I acknowledge that I will receive a copy of this consent concurrently upon execution via email to the email address I last provided but may also request a printed copy by calling the office of [place].    I understand that my insurance will be billed for this visit.   I have read or had this consent read to me.  I understand the contents of this consent, which adequately explains the benefits and risks of the Services being provided via telemedicine.   I have been provided ample opportunity to ask questions regarding this consent and the Services and have had  my questions answered to my satisfaction.  I give my informed consent for the services to be provided through the use of telemedicine in my medical care  By participating in this telemedicine visit I agree to the above.

## 2018-08-30 ENCOUNTER — Other Ambulatory Visit: Payer: Self-pay

## 2018-08-30 ENCOUNTER — Telehealth (INDEPENDENT_AMBULATORY_CARE_PROVIDER_SITE_OTHER): Payer: Medicare Other | Admitting: Cardiology

## 2018-08-30 ENCOUNTER — Encounter: Payer: Self-pay | Admitting: Cardiology

## 2018-08-30 DIAGNOSIS — Z952 Presence of prosthetic heart valve: Secondary | ICD-10-CM | POA: Diagnosis not present

## 2018-08-30 DIAGNOSIS — I2510 Atherosclerotic heart disease of native coronary artery without angina pectoris: Secondary | ICD-10-CM

## 2018-08-30 DIAGNOSIS — E782 Mixed hyperlipidemia: Secondary | ICD-10-CM

## 2018-08-30 DIAGNOSIS — I421 Obstructive hypertrophic cardiomyopathy: Secondary | ICD-10-CM

## 2018-08-30 NOTE — Patient Instructions (Signed)

## 2018-09-13 NOTE — Progress Notes (Signed)
Virtual Visit via Telephone Note   This visit type was conducted due to national recommendations for restrictions regarding the COVID-19 Pandemic (e.g. social distancing) in an effort to limit this patient's exposure and mitigate transmission in our community.  Due to her co-morbid illnesses, this patient is at least at moderate risk for complications without adequate follow up.  This format is felt to be most appropriate for this patient at this time.  The patient did not have access to video technology/had technical difficulties with video requiring transitioning to audio format only (telephone).  All issues noted in this document were discussed and addressed.  No physical exam could be performed with this format.  Please refer to the patient's chart for her  consent to telehealth for [facility].  Evaluation Performed:  Follow-up visit  This visit type was conducted due to national recommendations for restrictions regarding the COVID-19 Pandemic (e.g. social distancing).  This format is felt to be most appropriate for this patient at this time.  All issues noted in this document were discussed and addressed.  No physical exam was performed (except for noted visual exam findings with Video Visits).  Please refer to the patient's chart (MyChart message for video visits and phone note for telephone visits) for the patient's consent to telehealth for [facility].  Date:  09/13/2018  ID: Lori Gilbert, DOB 06/29/1932, MRN [id number]   Patient Location: [address] [place] [address]   Provider location:   [facility] [place] [facility]  PCP:  Marin Comment, FNP  Cardiologist:  Gypsy Balsam, MD     Chief Complaint: Doing well  History of Present Illness:    Lori Gilbert is a 85 y.o. female  who presents via audio/video conferencing for a telehealth visit today.  Past medical history significant for aortic valve replacement with TAVR that was done in March 2016, coronary  artery disease with 20% RCA as well as 30 to 40% LAD, diabetes, overall she seems to be doing well denies having issue no shortness of breath chest pain tightness squeezing pressure burning chest.  She admits that she is not too active she understands she probably needs to do more.   The patient does not have symptoms concerning for COVID-19 infection (fever, chills, cough, or new SHORTNESS OF BREATH).    Prior CV studies:   The following studies were reviewed today:  Echocardiogram done in March of this year showed normal left ventricular ejection fraction normally functioning aortic valve.     Past Medical History:  Diagnosis Date  . Anemia    takes Iron daily  . Aortic stenosis    a. s/p TAVR 06/30/14  . Aortic stenosis    severe   . Arthritis   . CAD (coronary artery disease), native coronary artery    Mild distal left main, 30-40% LAD, 20% RCA, normal circ at cath 04/23/14 Dr. Excell Seltzer   . Diabetes mellitus without complication (HCC)    takes Metformin and Amaryl daily  . GERD (gastroesophageal reflux disease) 03/25/2014   takes Omeprazole daily  . H/O hiatal hernia   . History of bronchitis    couple of yrs ago  . History of shingles   . Hyperlipidemia    takes Pravastatin daily  . Hypertension    takes Diltiazem and Cozaar daily  . Hypertensive heart disease 03/25/2014  . Hypertrophic obstructive cardiomyopathy (HCC) 03/25/2014  . Joint pain   . Nocturia   . S/P TAVR (transcatheter aortic valve  replacement) 06/30/2014   23 mm Edwards Sapien 3 transcatheter heart valve placed via open right transfemoral approach    Past Surgical History:  Procedure Laterality Date  . ANKLE FRACTURE SURGERY Bilateral   . CARPAL TUNNEL RELEASE Right 05/13/2013   Procedure: RIGHT CARPAL TUNNEL RELEASE;  Surgeon: Thera FlakeW D Caffrey Jr., MD;  Location: [facility];  Service: Orthopedics;  Laterality: Right;  . cataract surgery Bilateral   . CHOLECYSTECTOMY    . COLONOSCOPY    .  DILATION AND CURETTAGE OF UTERUS    . LEFT AND RIGHT HEART CATHETERIZATION WITH CORONARY ANGIOGRAM N/A 04/23/2014   Procedure: LEFT AND RIGHT HEART CATHETERIZATION WITH CORONARY ANGIOGRAM;  Surgeon: Micheline ChapmanMichael D Cooper, MD;  [facility];  Service: Cardiovascular;  Laterality: N/A;  . TEE WITHOUT CARDIOVERSION N/A 05/02/2014   Procedure: TRANSESOPHAGEAL ECHOCARDIOGRAM (TEE);  Surgeon: Wendall StadePeter C Nishan, MD;  [facility];  Service: Cardiovascular;  Laterality: N/A;  . TEE WITHOUT CARDIOVERSION N/A 06/30/2014   Procedure: TRANSESOPHAGEAL ECHOCARDIOGRAM (TEE);  Surgeon: Tonny BollmanMichael Cooper, MD;  [facility];  Service: Open Heart Surgery;  Laterality: N/A;  . TRANSCATHETER AORTIC VALVE REPLACEMENT, TRANSFEMORAL N/A 06/30/2014   Procedure: TRANSCATHETER AORTIC VALVE REPLACEMENT, TRANSFEMORAL;  Surgeon: Tonny BollmanMichael Cooper, MD;  [facility];  Service: Open Heart Surgery;  Laterality: N/A;  . WRIST FRACTURE SURGERY Left      Current Meds  Medication Sig  . diltiazem (DILACOR XR) 240 MG 24 hr capsule Take 240 mg by mouth daily.  . furosemide (LASIX) 20 MG tablet Take 20 mg by mouth daily.   Marland Kitchen. glimepiride (AMARYL) 1 MG tablet Take 1 mg by mouth daily with breakfast.  . ibuprofen (ADVIL,MOTRIN) 200 MG tablet Take 200 mg by mouth as needed.  Marland Kitchen. lisinopril (PRINIVIL,ZESTRIL) 20 MG tablet Take 20 mg by mouth daily.  . metFORMIN (GLUCOPHAGE) 500 MG tablet Take 500 mg by mouth 2 (two) times daily with a meal.   . metoprolol tartrate (LOPRESSOR) 25 MG tablet Take 0.5 tablets (12.5 mg total) by mouth 2 (two) times daily.  . pantoprazole (PROTONIX) 40 MG tablet Take 1 tablet (40 mg total) by mouth daily.  . potassium chloride (MICRO-K) 10 MEQ CR capsule Take 10 mEq by mouth daily.  . pravastatin (PRAVACHOL) 40 MG tablet Take 40 mg by mouth daily.  . sertraline (ZOLOFT) 100 MG tablet Take 100 mg by mouth daily.  . traZODone (DESYREL) 50 MG tablet Take 50 mg by mouth at bedtime.       Family History:  The patient's family history includes AAA (abdominal aortic aneurysm) in her brother; Coronary artery disease in her brother and brother; Diabetes in her brother and sister; Healthy in her mother and sister; Hodgkin's lymphoma in her father.   ROS:   Please see the history of present illness.     All other systems reviewed and are negative.   Labs/Other Tests and Data Reviewed:     Recent Labs: No results found for requested labs within last 8760 hours.  Recent Lipid Panel No results found for: CHOL, TRIG, HDL, CHOLHDL, VLDL, LDLCALC, LDLDIRECT    Exam:    Vital Signs:  There were no vitals taken for this visit.    Wt Readings from Last 3 Encounters:  03/13/18 188 lb (85.3 kg)  07/31/15 186 lb 3.2 oz (84.5 kg)  08/06/14 185 lb 1.9 oz (84 kg)     Well nourished, well developed in no acute distress. Alert awake oriented x3 happy to  be able to talk to me denies having any issues  Diagnosis for this visit:   1. S/P TAVR (transcatheter aortic valve replacement)   2. Coronary artery disease involving native coronary artery of native heart without angina pectoris   3. Mixed hyperlipidemia   4. Hypertrophic obstructive cardiomyopathy (HCC)      ASSESSMENT & PLAN:    1.  Status post TAVR valve functioning properly we will continue present management. 2.  Coronary artery disease stable denies have any symptoms. 3.  Mixed dyslipidemia we will continue present management.  COVID-19 Education: The signs and symptoms of COVID-19 were discussed with the patient and how to seek care for testing (follow up with PCP or arrange E-visit).  The importance of social distancing was discussed today.  Patient Risk:   After full review of this patients clinical status, I feel that they are at least moderate risk at this time.  Time:   Today, I have spent 13 minutes with the patient with telehealth technology discussing pt health issues.  I spent reviewing her chart before the  visit.  Visit was finished at 11:30AM.    Medication Adjustments/Labs and Tests Ordered: Current medicines are reviewed at length with the patient today.  Concerns regarding medicines are outlined above.  No orders of the defined types were placed in this encounter.  Medication changes: No orders of the defined types were placed in this encounter.    Disposition:  F/up in 5 months   Signed, Georgeanna Lea, MD, FACC5/10/2018 12:07AM    [facility]

## 2019-02-27 ENCOUNTER — Telehealth (INDEPENDENT_AMBULATORY_CARE_PROVIDER_SITE_OTHER): Payer: Medicare Other | Admitting: Cardiology

## 2019-02-27 ENCOUNTER — Encounter: Payer: Self-pay | Admitting: Cardiology

## 2019-02-27 ENCOUNTER — Other Ambulatory Visit: Payer: Self-pay

## 2019-02-27 DIAGNOSIS — R0609 Other forms of dyspnea: Secondary | ICD-10-CM

## 2019-02-27 DIAGNOSIS — I421 Obstructive hypertrophic cardiomyopathy: Secondary | ICD-10-CM

## 2019-02-27 DIAGNOSIS — I2510 Atherosclerotic heart disease of native coronary artery without angina pectoris: Secondary | ICD-10-CM

## 2019-02-27 DIAGNOSIS — R0600 Dyspnea, unspecified: Secondary | ICD-10-CM

## 2019-02-27 DIAGNOSIS — E119 Type 2 diabetes mellitus without complications: Secondary | ICD-10-CM

## 2019-02-27 DIAGNOSIS — Z952 Presence of prosthetic heart valve: Secondary | ICD-10-CM

## 2019-02-27 NOTE — Progress Notes (Signed)
Virtual Visit via Telephone Note   This visit type was conducted due to national recommendations for restrictions regarding the COVID-19 Pandemic (e.g. social distancing) in an effort to limit this patient's exposure and mitigate transmission in our community.  Due to her co-morbid illnesses, this patient is at least at moderate risk for complications without adequate follow up.  This format is felt to be most appropriate for this patient at this time.  The patient did not have access to video technology/had technical difficulties with video requiring transitioning to audio format only (telephone).  All issues noted in this document were discussed and addressed.  No physical exam could be performed with this format.  Please refer to the patient's chart for her  consent to telehealth for [facility].  Evaluation Performed:  Follow-up visit  This visit type was conducted due to national recommendations for restrictions regarding the COVID-19 Pandemic (e.g. social distancing).  This format is felt to be most appropriate for this patient at this time.  All issues noted in this document were discussed and addressed.  No physical exam was performed (except for noted visual exam findings with Video Visits).  Please refer to the patient's chart (MyChart message for video visits and phone note for telephone visits) for the patient's consent to telehealth for [facility].  Date:  02/27/2019  ID: Lori Gilbert, DOB 03-Jun-1932, MRN [id number]   Patient Location: [place] [address]   Provider location:   [place] Office  PCP:  Suzan Garibaldi, [place]  Cardiologist:  Jenne Campus, MD     Chief Complaint: Doing well just tired  History of Present Illness:    Lori Gilbert is a 85 y.o. female  who presents via audio/video conferencing for a telehealth visit today.  With past medical history significant for severe aortic stenosis that required TAVR which was done in  March 2016, coronary artery disease with 20% RCA as well as 30 to 40% LAD, diabetes.  She does have a televisit with me today.  She was unable to establish video link therefore we only talk over the phone.  She denies having any chest pain, no tightness, no pressure no burning no squeezing in the chest.  Overall seems to be doing well but complain of being weak tired exhausted and complained of having soreness all over the body.   The patient does not have symptoms concerning for COVID-19 infection (fever, chills, cough, or new SHORTNESS OF BREATH).    Prior CV studies:   The following studies were reviewed today:  I reviewed echocardiogram from outside places done in March of last year     Past Medical History:  Diagnosis Date  . Anemia    takes Iron daily  . Aortic stenosis    a. s/p TAVR 06/30/14  . Aortic stenosis    severe   . Arthritis   . CAD (coronary artery disease), native coronary artery    Mild distal left main, 30-40% LAD, 20% RCA, normal circ at cath 04/23/14 Dr. Burt Knack   . Diabetes mellitus without complication ([place])    takes Metformin and Amaryl daily  . GERD (gastroesophageal reflux disease) 03/25/2014   takes Omeprazole daily  . H/O hiatal hernia   . History of bronchitis    couple of yrs ago  . History of shingles   . Hyperlipidemia    takes Pravastatin daily  . Hypertension    takes Diltiazem and Cozaar daily  . Hypertensive  heart disease 03/25/2014  . Hypertrophic obstructive cardiomyopathy (HCC) 03/25/2014  . Joint pain   . Nocturia   . S/P TAVR (transcatheter aortic valve replacement) 06/30/2014   23 mm Edwards Sapien 3 transcatheter heart valve placed via open right transfemoral approach    Past Surgical History:  Procedure Laterality Date  . ANKLE FRACTURE SURGERY Bilateral   . CARPAL TUNNEL RELEASE Right 05/13/2013   Procedure: RIGHT CARPAL TUNNEL RELEASE;  Surgeon: Thera Flake., MD;  Location: [facility];  Service: Orthopedics;   Laterality: Right;  . cataract surgery Bilateral   . CHOLECYSTECTOMY    . COLONOSCOPY    . DILATION AND CURETTAGE OF UTERUS    . LEFT AND RIGHT HEART CATHETERIZATION WITH CORONARY ANGIOGRAM N/A 04/23/2014   Procedure: LEFT AND RIGHT HEART CATHETERIZATION WITH CORONARY ANGIOGRAM;  Surgeon: Micheline Chapman, MD;  Location[facility] C[facility] LAB;  Service: Cardiovascular;  Laterality: N/A;  . TEE WITHOUT CARDIOVERSION N/A 05/02/2014   Procedure: TRANSESOPHAGEAL ECHOCARDIOGRAM (TEE);  Surgeon: Wendall Stade, MD;  Location: [facility] ENDOSCOPY;  Service: Cardiovascular;  Laterality: N/A;  . TEE WITHOUT CARDIOVERSION N/A 06/30/2014   Procedure: TRANSESOPHAGEAL ECHOCARDIOGRAM (TEE);  Surgeon: [name], MD;  [facility];  Service: Open Heart Surgery;  Laterality: N/A;  . TRANSCATHETER AORTIC VALVE REPLACEMENT, TRANSFEMORAL N/A 06/30/2014   Procedure: TRANSCATHETER AORTIC VALVE REPLACEMENT, TRANSFEMORAL;  Surgeon: [name], MD;  Location: [facility] OR;  Service: Open Heart Surgery;  Laterality: N/A;  . WRIST FRACTURE SURGERY Left      Current Meds  Medication Sig  . diltiazem (DILACOR XR) 240 MG 24 hr capsule Take 240 mg by mouth daily.  . furosemide (LASIX) 20 MG tablet Take 20 mg by mouth daily.   Marland Kitchen glimepiride (AMARYL) 1 MG tablet Take 1 mg by mouth daily with breakfast.  . ibuprofen (ADVIL,MOTRIN) 200 MG tablet Take 200 mg by mouth as needed.  Marland Kitchen lisinopril (PRINIVIL,ZESTRIL) 20 MG tablet Take 20 mg by mouth daily.  . metFORMIN (GLUCOPHAGE) 500 MG tablet Take 500 mg by mouth 2 (two) times daily with a meal.   . metoprolol tartrate (LOPRESSOR) 25 MG tablet Take 0.5 tablets (12.5 mg total) by mouth 2 (two) times daily.  . pantoprazole (PROTONIX) 40 MG tablet Take 1 tablet (40 mg total) by mouth daily.  . potassium chloride (MICRO-K) 10 MEQ CR capsule Take 10 mEq by mouth daily.  . pravastatin (PRAVACHOL) 40 MG tablet Take 40 mg by mouth daily.  . sertraline (ZOLOFT) 100 MG tablet Take 100 mg by mouth daily.   . traZODone (DESYREL) 50 MG tablet Take 50 mg by mouth at bedtime.       Family History: The patient's family history includes AAA (abdominal aortic aneurysm) in her brother; Coronary artery disease in her brother and brother; Diabetes in her brother and sister; Healthy in her mother and sister; Hodgkin's lymphoma in her father.   ROS:   Please see the history of present illness.     All other systems reviewed and are negative.   Labs/Other Tests and Data Reviewed:     Recent Labs: No results found for requested labs within last 8760 hours.  Recent Lipid Panel No results found for: CHOL, TRIG, HDL, CHOLHDL, VLDL, LDLCALC, LDLDIRECT    Exam:    Vital Signs:  There were no vitals taken for this visit.    Wt Readings from Last 3 Encounters:  03/13/18 188 lb (85.3 kg)  07/31/15 186 lb 3.2 oz (84.5 kg)  08/06/14 185 lb 1.9 oz (84 kg)     Well nourished, well developed in no acute distress. Alert oriented x3 talking to me over the phone, not in any distress she is at home and in our office in [facility].  Diagnosis for this visit:   1. S/P TAVR (transcatheter aortic valve replacement)   2. Dyspnea on exertion   3. Diabetes mellitus type 2, noninsulin dependent (HCC)   4. Coronary artery disease involving native coronary artery of native heart without angina pectoris   5. Hypertrophic obstructive cardiomyopathy (HCC)      ASSESSMENT & PLAN:    1.  Status post TAVR.  I will schedule her to have another echocardiogram to look at the valve on top of that last echocardiogram done in March it was described as poor quality and unable to assess valves well.  On top of that she does have a remote history of pulmonary hypertension which need to be rechecked. 2.  Dyspnea on exertion she said she does not exert at all and she does not feel any shortness of breath. 3.  Diabetes followed by internal medicine team will call the office try to get hemoglobin A1c. 4.  Coronary artery  disease stable from that point review continue present management. 5.  History of hypertrophic obstructive cardiomyopathy, echocardiogram will be done to confirm that.  COVID-19 Education: The signs and symptoms of COVID-19 were discussed with the patient and how to seek care for testing (follow up with PCP or arrange E-visit).  The importance of social distancing was discussed today.  Patient Risk:   After full review of this patients clinical status, I feel that they are at least moderate risk at this time.  Time:   Today, I have spent 10 minutes with the patient with telehealth technology discussing pt health issues.  I spent 15 minutes reviewing her chart before the visit.  Visit was finished at 9:13 AM.    Medication Adjustments/Labs and Tests Ordered: Current medicines are reviewed at length with the patient today.  Concerns regarding medicines are outlined above.  No orders of the defined types were placed in this encounter.  Medication changes: No orders of the defined types were placed in this encounter.    Disposition: Follow-up 6 months  Signed, Georgeanna Lea, MD, [facility] 02/27/2019 9:11 AM    [facility]

## 2019-02-27 NOTE — Addendum Note (Signed)
Addended by: Ashok Norris on: 02/27/2019 09:56 AM   Modules accepted: Orders

## 2019-02-27 NOTE — Patient Instructions (Signed)
Medication Instructions:  Your physician recommends that you continue on your current medications as directed. Please refer to the Current Medication list given to you today.  *If you need a refill on your cardiac medications before your next appointment, please call your pharmacy*  Lab Work: None.  If you have labs (blood work) drawn today and your tests are completely normal, you will receive your results only by: . MyChart Message (if you have MyChart) OR . A paper copy in the mail If you have any lab test that is abnormal or we need to change your treatment, we will call you to review the results.  Testing/Procedures: Your physician has requested that you have an echocardiogram. Echocardiography is a painless test that uses sound waves to create images of your heart. It provides your doctor with information about the size and shape of your heart and how well your heart's chambers and valves are working. This procedure takes approximately one hour. There are no restrictions for this procedure.     Follow-Up: At [facility], you and your health needs are our priority.  As part of our continuing mission to provide you with exceptional heart care, we have created designated Provider Care Teams.  These Care Teams include your primary Cardiologist (physician) and Advanced Practice Providers (APPs -  Physician Assistants and Nurse Practitioners) who all work together to provide you with the care you need, when you need it.  Your next appointment:   6 months  The format for your next appointment:   In Person  Provider:   You may see Dr. Krasowski or the following Advanced Practice Provider on your designated Care Team:    Caitlin Walker, FNP   Other Instructions   Echocardiogram An echocardiogram is a procedure that uses painless sound waves (ultrasound) to produce an image of the heart. Images from an echocardiogram can provide important information about:  Signs of coronary  artery disease (CAD).  Aneurysm detection. An aneurysm is a weak or damaged part of an artery wall that bulges out from the normal force of blood pumping through the body.  Heart size and shape. Changes in the size or shape of the heart can be associated with certain conditions, including heart failure, aneurysm, and CAD.  Heart muscle function.  Heart valve function.  Signs of a past heart attack.  Fluid buildup around the heart.  Thickening of the heart muscle.  A tumor or infectious growth around the heart valves. Tell a health care provider about:  Any allergies you have.  All medicines you are taking, including vitamins, herbs, eye drops, creams, and over-the-counter medicines.  Any blood disorders you have.  Any surgeries you have had.  Any medical conditions you have.  Whether you are pregnant or may be pregnant. What are the risks? Generally, this is a safe procedure. However, problems may occur, including:  Allergic reaction to dye (contrast) that may be used during the procedure. What happens before the procedure? No specific preparation is needed. You may eat and drink normally. What happens during the procedure?   An IV tube may be inserted into one of your veins.  You may receive contrast through this tube. A contrast is an injection that improves the quality of the pictures from your heart.  A gel will be applied to your chest.  A wand-like tool (transducer) will be moved over your chest. The gel will help to transmit the sound waves from the transducer.  The sound waves will harmlessly   bounce off of your heart to allow the heart images to be captured in real-time motion. The images will be recorded on a computer. The procedure may vary among health care providers and hospitals. What happens after the procedure?  You may return to your normal, everyday life, including diet, activities, and medicines, unless your health care provider tells you not to do  that. Summary  An echocardiogram is a procedure that uses painless sound waves (ultrasound) to produce an image of the heart.  Images from an echocardiogram can provide important information about the size and shape of your heart, heart muscle function, heart valve function, and fluid buildup around your heart.  You do not need to do anything to prepare before this procedure. You may eat and drink normally.  After the echocardiogram is completed, you may return to your normal, everyday life, unless your health care provider tells you not to do that. This information is not intended to replace advice given to you by your health care provider. Make sure you discuss any questions you have with your health care provider. Document Released: 04/08/2000 Document Revised: 08/02/2018 Document Reviewed: 05/14/2016 Elsevier Patient Education  2020 [employer]   

## 2019-03-01 ENCOUNTER — Ambulatory Visit (HOSPITAL_BASED_OUTPATIENT_CLINIC_OR_DEPARTMENT_OTHER): Payer: Medicare Other

## 2019-04-16 ENCOUNTER — Ambulatory Visit (INDEPENDENT_AMBULATORY_CARE_PROVIDER_SITE_OTHER): Payer: Medicare Other

## 2019-04-16 ENCOUNTER — Other Ambulatory Visit: Payer: Self-pay

## 2019-04-16 DIAGNOSIS — I421 Obstructive hypertrophic cardiomyopathy: Secondary | ICD-10-CM | POA: Diagnosis not present

## 2019-04-16 DIAGNOSIS — R0600 Dyspnea, unspecified: Secondary | ICD-10-CM

## 2019-04-16 DIAGNOSIS — I2510 Atherosclerotic heart disease of native coronary artery without angina pectoris: Secondary | ICD-10-CM | POA: Diagnosis not present

## 2020-06-11 DIAGNOSIS — Z8619 Personal history of other infectious and parasitic diseases: Secondary | ICD-10-CM | POA: Insufficient documentation

## 2020-06-11 DIAGNOSIS — E119 Type 2 diabetes mellitus without complications: Secondary | ICD-10-CM | POA: Insufficient documentation

## 2020-06-11 DIAGNOSIS — R351 Nocturia: Secondary | ICD-10-CM | POA: Insufficient documentation

## 2020-06-11 DIAGNOSIS — M1990 Unspecified osteoarthritis, unspecified site: Secondary | ICD-10-CM | POA: Insufficient documentation

## 2020-06-11 DIAGNOSIS — D649 Anemia, unspecified: Secondary | ICD-10-CM | POA: Insufficient documentation

## 2020-06-11 DIAGNOSIS — I10 Essential (primary) hypertension: Secondary | ICD-10-CM | POA: Insufficient documentation

## 2020-06-11 DIAGNOSIS — Z8719 Personal history of other diseases of the digestive system: Secondary | ICD-10-CM | POA: Insufficient documentation

## 2020-06-11 DIAGNOSIS — M2550 Pain in unspecified joint: Secondary | ICD-10-CM | POA: Insufficient documentation

## 2020-06-11 DIAGNOSIS — Z8709 Personal history of other diseases of the respiratory system: Secondary | ICD-10-CM | POA: Insufficient documentation

## 2020-06-12 ENCOUNTER — Ambulatory Visit: Payer: Medicare Other | Admitting: Cardiology

## 2020-06-12 ENCOUNTER — Other Ambulatory Visit: Payer: Self-pay

## 2020-06-12 ENCOUNTER — Encounter: Payer: Self-pay | Admitting: Cardiology

## 2020-06-12 VITALS — BP 134/70 | HR 72 | Ht 61.0 in | Wt 173.0 lb

## 2020-06-12 DIAGNOSIS — Z952 Presence of prosthetic heart valve: Secondary | ICD-10-CM | POA: Diagnosis not present

## 2020-06-12 DIAGNOSIS — I2510 Atherosclerotic heart disease of native coronary artery without angina pectoris: Secondary | ICD-10-CM | POA: Diagnosis not present

## 2020-06-12 DIAGNOSIS — E782 Mixed hyperlipidemia: Secondary | ICD-10-CM

## 2020-06-12 DIAGNOSIS — E119 Type 2 diabetes mellitus without complications: Secondary | ICD-10-CM

## 2020-06-12 NOTE — Addendum Note (Signed)
Addended by: Eleonore Chiquito on: 06/12/2020 03:19 PM   Modules accepted: Orders

## 2020-06-12 NOTE — Patient Instructions (Signed)
Medication Instructions:  No medication changes. *If you need a refill on your cardiac medications before your next appointment, please call your pharmacy*   Lab Work: None ordered If you have labs (blood work) drawn today and your tests are completely normal, you will receive your results only by: MyChart Message (if you have MyChart) OR A paper copy in the mail If you have any lab test that is abnormal or we need to change your treatment, we will call you to review the results.   Testing/Procedures: Your physician has requested that you have an echocardiogram. Echocardiography is a painless test that uses sound waves to create images of your heart. It provides your doctor with information about the size and shape of your heart and how well your heart's chambers and valves are working. This procedure takes approximately one hour. There are no restrictions for this procedure.    Follow-Up: At [facility], you and your health needs are our priority.  As part of our continuing mission to provide you with exceptional heart care, we have created designated Provider Care Teams.  These Care Teams include your primary Cardiologist (physician) and Advanced Practice Providers (APPs -  Physician Assistants and Nurse Practitioners) who all work together to provide you with the care you need, when you need it.  We recommend signing up for the patient portal called "MyChart".  Sign up information is provided on this After Visit Summary.  MyChart is used to connect with patients for Virtual Visits (Telemedicine).  Patients are able to view lab/test results, encounter notes, upcoming appointments, etc.  Non-urgent messages can be sent to your provider as well.   To learn more about what you can do with MyChart, go to [email or web address].    Your next appointment:   6 month(s)  The format for your next appointment:   In Person  Provider:   Rajan Revankar, MD   Other  Instructions Echocardiogram An echocardiogram is a test that uses sound waves (ultrasound) to produce images of the heart. Images from an echocardiogram can provide important information about: Heart size and shape. The size and thickness and movement of your heart's walls. Heart muscle function and strength. Heart valve function or if you have stenosis. Stenosis is when the heart valves are too narrow. If blood is flowing backward through the heart valves (regurgitation). A tumor or infectious growth around the heart valves. Areas of heart muscle that are not working well because of poor blood flow or injury from a heart attack. Aneurysm detection. An aneurysm is a weak or damaged part of an artery wall. The wall bulges out from the normal force of blood pumping through the body. Tell a health care provider about: Any allergies you have. All medicines you are taking, including vitamins, herbs, eye drops, creams, and over-the-counter medicines. Any blood disorders you have. Any surgeries you have had. Any medical conditions you have. Whether you are pregnant or may be pregnant. What are the risks? Generally, this is a safe test. However, problems may occur, including an allergic reaction to dye (contrast) that may be used during the test. What happens before the test? No specific preparation is needed. You may eat and drink normally. What happens during the test? You will take off your clothes from the waist up and put on a hospital gown. Electrodes or electrocardiogram (ECG)patches may be placed on your chest. The electrodes or patches are then connected to a device that monitors your heart rate and rhythm. You will   lie down on a table for an ultrasound exam. A gel will be applied to your chest to help sound waves pass through your skin. A handheld device, called a transducer, will be pressed against your chest and moved over your heart. The transducer produces sound waves that travel to  your heart and bounce back (or "echo" back) to the transducer. These sound waves will be captured in real-time and changed into images of your heart that can be viewed on a video monitor. The images will be recorded on a computer and reviewed by your health care provider. You may be asked to change positions or hold your breath for a short time. This makes it easier to get different views or better views of your heart. In some cases, you may receive contrast through an IV in one of your veins. This can improve the quality of the pictures from your heart. The procedure may vary among health care providers and hospitals.   What can I expect after the test? You may return to your normal, everyday life, including diet, activities, and medicines, unless your health care provider tells you not to do that. Follow these instructions at home: It is up to you to get the results of your test. Ask your health care provider, or the department that is doing the test, when your results will be ready. Keep all follow-up visits. This is important. Summary An echocardiogram is a test that uses sound waves (ultrasound) to produce images of the heart. Images from an echocardiogram can provide important information about the size and shape of your heart, heart muscle function, heart valve function, and other possible heart problems. You do not need to do anything to prepare before this test. You may eat and drink normally. After the echocardiogram is completed, you may return to your normal, everyday life, unless your health care provider tells you not to do that. This information is not intended to replace advice given to you by your health care provider. Make sure you discuss any questions you have with your health care provider. Document Revised: 12/03/2019 Document Reviewed: 12/03/2019 Elsevier Patient Education  2021 Elsevier Inc.   

## 2020-06-12 NOTE — Progress Notes (Signed)
Cardiology Office Note:    Date:  06/12/2020   ID:  Lori Gilbert, DOB 1932-05-27, MRN [id number]  PCP:  Marin Comment, FNP  Cardiologist:  Gypsy Balsam, MD    Referring MD: Marin Comment, FNP   Chief Complaint  Patient presents with  . Shortness of Breath    History of Present Illness:    Lori Gilbert is a 87 y.o. female with past medical history significant for severe arctic stenosis that required intervention with IV, that was done in March 2016, she received 23 mm Edwards sapiens 3 transcatheter valve via right femoral approach.  At that time she also had cardiac catheterization done which showed 20% RCA as well as 30 to 40% LAD disease.  She does have diabetes, dyslipidemia.  She comes today 2 months for follow-up and she tells me doing fine my daughter wanted me to come here today.  She denies having any chest pain tightness squeezing pressure burning chest no palpitations no dizziness no swelling of lower extremities.  She says she does not do much.  She just sits and and watch TV.  Past Medical History:  Diagnosis Date  . Anemia    takes Iron daily  . Aortic stenosis    a. s/p TAVR 06/30/14  . Aortic stenosis    severe   . Arthritis   . CAD (coronary artery disease), native coronary artery    Mild distal left main, 30-40% LAD, 20% RCA, normal circ at cath 04/23/14 Dr. Excell Seltzer   . Diabetes mellitus type 2, noninsulin dependent (HCC) 03/25/2014  . Diabetes mellitus without complication (HCC)    takes Metformin and Amaryl daily  . Dyspnea 03/25/2014  . GERD (gastroesophageal reflux disease) 03/25/2014   takes Omeprazole daily  . H/O hiatal hernia   . History of bronchitis    couple of yrs ago  . History of shingles   . Hyperlipidemia    takes Pravastatin daily  . Hypertension    takes Diltiazem and Cozaar daily  . Hypertensive heart disease 03/25/2014  . Hypertrophic obstructive cardiomyopathy (HCC) 03/25/2014  . Joint pain   . Nocturia   . Obesity (BMI 30-39.9)  03/25/2014  . Pulmonary hypertension, secondary 05/01/2014  . S/P TAVR (transcatheter aortic valve replacement) 06/30/2014   23 mm Edwards Sapien 3 transcatheter heart valve placed via open right transfemoral approach  . Severe aortic stenosis 06/30/2014    Past Surgical History:  Procedure Laterality Date  . ANKLE FRACTURE SURGERY Bilateral   . CARPAL TUNNEL RELEASE Right 05/13/2013   Procedure: RIGHT CARPAL TUNNEL RELEASE;  Surgeon: Thera Flake., MD;  Location: [facility];  Service: Orthopedics;  Laterality: Right;  . cataract surgery Bilateral   . CHOLECYSTECTOMY    . COLONOSCOPY    . DILATION AND CURETTAGE OF UTERUS    . LEFT AND RIGHT HEART CATHETERIZATION WITH CORONARY ANGIOGRAM N/A 04/23/2014   Procedure: LEFT AND RIGHT HEART CATHETERIZATION WITH CORONARY ANGIOGRAM;  Surgeon: Micheline Chapman, MD;  Location: [facility] CATH LAB;  Service: Cardiovascular;  Laterality: N/A;  . TEE WITHOUT CARDIOVERSION N/A 05/02/2014   Procedure: TRANSESOPHAGEAL ECHOCARDIOGRAM (TEE);  Surgeon: Wendall Stade, MD;  Location: [facility] ENDOSCOPY;  Service: Cardiovascular;  Laterality: N/A;  . TEE WITHOUT CARDIOVERSION N/A 06/30/2014   Procedure: TRANSESOPHAGEAL ECHOCARDIOGRAM (TEE);  Surgeon: [name], MD;  Location: [facility] OR;  Service: Open Heart Surgery;  Laterality: N/A;  . TRANSCATHETER AORTIC VALVE REPLACEMENT, TRANSFEMORAL N/A 06/30/2014   Procedure: TRANSCATHETER AORTIC VALVE REPLACEMENT,  TRANSFEMORAL;  Surgeon: [name], MD;  Location: [facility] OR;  Service: Open Heart Surgery;  Laterality: N/A;  . WRIST FRACTURE SURGERY Left     Current Medications: Current Meds  Medication Sig  . acetaminophen (TYLENOL) 500 MG tablet Take 500 mg by mouth every 6 (six) hours as needed for moderate pain.  . colestipol (COLESTID) 1 g tablet Take 1 g by mouth daily as needed (diarrhea).  . diltiazem (DILACOR XR) 240 MG 24 hr capsule Take 240 mg by mouth daily.  . furosemide (LASIX) 20 MG tablet Take 20 mg by mouth  daily.   Marland Kitchen glimepiride (AMARYL) 1 MG tablet Take 1 mg by mouth daily with breakfast.  . ibuprofen (ADVIL,MOTRIN) 200 MG tablet Take 200 mg by mouth as needed for moderate pain.  Marland Kitchen losartan (COZAAR) 50 MG tablet Take 50 mg by mouth daily.  . metFORMIN (GLUCOPHAGE) 500 MG tablet Take 1,000 mg by mouth 2 (two) times daily with a meal.  . metoprolol tartrate (LOPRESSOR) 25 MG tablet Take 0.5 tablets (12.5 mg total) by mouth 2 (two) times daily.  . pantoprazole (PROTONIX) 40 MG tablet Take 1 tablet (40 mg total) by mouth daily.  . potassium chloride (MICRO-K) 10 MEQ CR capsule Take 10 mEq by mouth daily.  . pravastatin (PRAVACHOL) 40 MG tablet Take 40 mg by mouth daily.  . sertraline (ZOLOFT) 100 MG tablet Take 100 mg by mouth daily.  . traZODone (DESYREL) 50 MG tablet Take 50 mg by mouth at bedtime.      Allergies:   Bextra [valdecoxib], Monopril [fosinopril], and Codeine   Social History   Socioeconomic History  . Marital status: Widowed    Spouse name: Not on file  . Number of children: Not on file  . Years of education: Not on file  . Highest education level: Not on file  Occupational History  . Not on file  Tobacco Use  . Smoking status: Never Smoker  . Smokeless tobacco: Never Used  Vaping Use  . Vaping Use: Never used  Substance and Sexual Activity  . Alcohol use: No    Alcohol/week: 0.0 standard drinks  . Drug use: No  . Sexual activity: Never    Birth control/protection: Post-menopausal  Other Topics Concern  . Not on file  Social History Narrative  . Not on file   Social Determinants of Health   Financial Resource Strain: Not on file  Food Insecurity: Not on file  Transportation Needs: Not on file  Physical Activity: Not on file  Stress: Not on file  Social Connections: Not on file     Family History: The patient's family history includes AAA (abdominal aortic aneurysm) in her brother; Coronary artery disease in her brother and brother; Diabetes in her brother  and sister; Healthy in her mother and sister; Hodgkin's lymphoma in her father. ROS:   Please see the history of present illness.    All 14 point review of systems negative except as described per history of present illness  EKGs/Labs/Other Studies Reviewed:      Recent Labs: No results found for requested labs within last 8760 hours.  Recent Lipid Panel No results found for: CHOL, TRIG, HDL, CHOLHDL, VLDL, LDLCALC, LDLDIRECT  Physical Exam:    VS:  BP 134/70 (BP Location: Left Arm, Patient Position: Sitting)   Pulse 72   Ht 5\' 1"  (1.549 m)   Wt 173 lb (78.5 kg)   SpO2 90%   BMI 32.69 kg/m     Wt  Readings from Last 3 Encounters:  06/12/20 173 lb (78.5 kg)  03/13/18 188 lb (85.3 kg)  07/31/15 186 lb 3.2 oz (84.5 kg)     GEN:  Well nourished, well developed in no acute distress HEENT: Normal NECK: No JVD; No carotid bruits LYMPHATICS: No lymphadenopathy CARDIAC: RRR, systolic ejection murmur grade 1/6 to 2/6 best heard right upper portion of the sternum, no rubs, no gallops RESPIRATORY:  Clear to auscultation without rales, wheezing or rhonchi  ABDOMEN: Soft, non-tender, non-distended MUSCULOSKELETAL:  No edema; No deformity  SKIN: Warm and dry LOWER EXTREMITIES: no swelling NEUROLOGIC:  Alert and oriented x 3 PSYCHIATRIC:  Normal affect   ASSESSMENT:    1. S/P TAVR (transcatheter aortic valve replacement)   2. Coronary artery disease involving native coronary artery of native heart without angina pectoris   3. Diabetes mellitus without complication (HCC)   4. Mixed hyperlipidemia    PLAN:    In order of problems listed above:  1. Status post TAVI.  I stand to repeat her echocardiogram last one was done in 2020.  All surgical pulmonary hypertension which is another indication for echocardiogram. 2. Coronary artery disease which was nonobstructive from last evaluation from 2016.  She is asymptomatic we will continue risk factors modifications. 3. Diabetes  mellitus.  Being followed by antimedicine team.  She is on Metformin only as well as Amaryl. 4. Dyslipidemia she is on pravastatin 40 mg daily which I will continue a contact primary care physician to get her fasting lipid profile.  I do not have any results in K PN.   Medication Adjustments/Labs and Tests Ordered: Current medicines are reviewed at length with the patient today.  Concerns regarding medicines are outlined above.  No orders of the defined types were placed in this encounter.  Medication changes: No orders of the defined types were placed in this encounter.   Signed, Georgeanna Lea, MD, [facility] 06/12/2020 3:12 PM    [facility]

## 2020-07-14 ENCOUNTER — Ambulatory Visit (INDEPENDENT_AMBULATORY_CARE_PROVIDER_SITE_OTHER): Payer: Medicare Other

## 2020-07-14 ENCOUNTER — Other Ambulatory Visit: Payer: Self-pay

## 2020-07-14 DIAGNOSIS — Z952 Presence of prosthetic heart valve: Secondary | ICD-10-CM | POA: Diagnosis not present

## 2020-07-14 NOTE — Progress Notes (Signed)
Complete echocardiogram performed.  Jimmy Adley Castello RDCS, RVT  

## 2020-07-15 LAB — ECHOCARDIOGRAM COMPLETE
AR max vel: 1.5 cm2
AV Area VTI: 1.71 cm2
AV Area mean vel: 1.62 cm2
AV Mean grad: 13.5 mmHg
AV Peak grad: 25.6 mmHg
Ao pk vel: 2.53 m/s
Area-P 1/2: 1.95 cm2
MV VTI: 1.26 cm2
S' Lateral: 1.7 cm

## 2020-07-17 ENCOUNTER — Telehealth: Payer: Self-pay

## 2020-07-17 NOTE — Telephone Encounter (Signed)
Patient notified of test results and verbalized understanding.   

## 2020-07-17 NOTE — Telephone Encounter (Signed)
-----   Message from Georgeanna Lea, MD sent at 07/16/2020 10:30 AM EDT ----- Echocardiogram showed normal left ventricle ejection fraction, left ventricle hypertrophy, status post aortic valve TAVI, functioning normally.

## 2020-12-14 ENCOUNTER — Encounter: Payer: Self-pay | Admitting: Cardiology

## 2020-12-14 ENCOUNTER — Other Ambulatory Visit: Payer: Self-pay

## 2020-12-14 ENCOUNTER — Ambulatory Visit: Payer: Medicare Other | Admitting: Cardiology

## 2020-12-14 VITALS — BP 140/70 | HR 64 | Ht 61.0 in | Wt 177.0 lb

## 2020-12-14 DIAGNOSIS — I2510 Atherosclerotic heart disease of native coronary artery without angina pectoris: Secondary | ICD-10-CM

## 2020-12-14 DIAGNOSIS — Z952 Presence of prosthetic heart valve: Secondary | ICD-10-CM | POA: Diagnosis not present

## 2020-12-14 DIAGNOSIS — I421 Obstructive hypertrophic cardiomyopathy: Secondary | ICD-10-CM | POA: Diagnosis not present

## 2020-12-14 DIAGNOSIS — E782 Mixed hyperlipidemia: Secondary | ICD-10-CM | POA: Diagnosis not present

## 2020-12-14 NOTE — Progress Notes (Signed)
Ekg

## 2020-12-14 NOTE — Progress Notes (Signed)
Cardiology Office Note:    Date:  12/14/2020   ID:  Lori Gilbert, DOB 1932/10/09, MRN [id number]  PCP:  Marin Comment, FNP  Cardiologist:  Gypsy Balsam, MD    Referring MD: Marin Comment, FNP   Chief Complaint  Patient presents with   Follow-up  I am short of breath  History of Present Illness:    Lori Gilbert is a 87 y.o. female  female with past medical history significant for severe arctic stenosis that required intervention with IV, that was done in March 2016, she received 23 mm Edwards sapiens 3 transcatheter valve via right femoral approach.  At that time she also had cardiac catheterization done which showed 20% RCA as well as 30 to 40% LAD disease.  She does have diabetes, dyslipidemia. Is coming today 2 months of follow-up.  He complained of having shortness of breath.  She mentioned that this is a gradual process over the last few months 2-3.  There is no dizziness there is no chest pain there is minimal swelling of lower extremities.  She tells me that she just came back from a long trip to [place].  She said it was difficult for her but she is doing well.  Shortness that happened even before the trip.  Past Medical History:  Diagnosis Date   Anemia    takes Iron daily   Aortic stenosis    a. s/p TAVR 06/30/14   Aortic stenosis    severe    Arthritis    CAD (coronary artery disease), native coronary artery    Mild distal left main, 30-40% LAD, 20% RCA, normal circ at cath 04/23/14 Dr. Excell Seltzer    Diabetes mellitus type 2, noninsulin dependent (HCC) 03/25/2014   Diabetes mellitus without complication (HCC)    takes Metformin and Amaryl daily   Dyspnea 03/25/2014   GERD (gastroesophageal reflux disease) 03/25/2014   takes Omeprazole daily   H/O hiatal hernia    History of bronchitis    couple of yrs ago   History of shingles    Hyperlipidemia    takes Pravastatin daily   Hypertension    takes Diltiazem and Cozaar daily   Hypertensive heart disease 03/25/2014    Hypertrophic obstructive cardiomyopathy (HCC) 03/25/2014   Joint pain    Nocturia    Obesity (BMI 30-39.9) 03/25/2014   Pulmonary hypertension, secondary 05/01/2014   S/P TAVR (transcatheter aortic valve replacement) 06/30/2014   23 mm Edwards Sapien 3 transcatheter heart valve placed via open right transfemoral approach   Severe aortic stenosis 06/30/2014    Past Surgical History:  Procedure Laterality Date   ANKLE FRACTURE SURGERY Bilateral    CARPAL TUNNEL RELEASE Right 05/13/2013   Procedure: RIGHT CARPAL TUNNEL RELEASE;  Surgeon: Thera Flake., MD;  Location: [facility];  Service: Orthopedics;  Laterality: Right;   cataract surgery Bilateral    CHOLECYSTECTOMY     COLONOSCOPY     DILATION AND CURETTAGE OF UTERUS     LEFT AND RIGHT HEART CATHETERIZATION WITH CORONARY ANGIOGRAM N/A 04/23/2014   Procedure: LEFT AND RIGHT HEART CATHETERIZATION WITH CORONARY ANGIOGRAM;  Surgeon: Micheline Chapman, MD;  Location: [facility] CATH LAB;  Service: Cardiovascular;  Laterality: N/A;   TEE WITHOUT CARDIOVERSION N/A 05/02/2014   Procedure: TRANSESOPHAGEAL ECHOCARDIOGRAM (TEE);  Surgeon: Wendall Stade, MD;  Location: [facility] ENDOSCOPY;  Service: Cardiovascular;  Laterality: N/A;   TEE WITHOUT CARDIOVERSION N/A 06/30/2014   Procedure: TRANSESOPHAGEAL ECHOCARDIOGRAM (TEE);  Surgeon: [name], MD;  Location: [facility] OR;  Service: Open Heart Surgery;  Laterality: N/A;   TRANSCATHETER AORTIC VALVE REPLACEMENT, TRANSFEMORAL N/A 06/30/2014   Procedure: TRANSCATHETER AORTIC VALVE REPLACEMENT, TRANSFEMORAL;  Surgeon: [name], MD;  Location: [facility] OR;  Service: Open Heart Surgery;  Laterality: N/A;   WRIST FRACTURE SURGERY Left     Current Medications: Current Meds  Medication Sig   acetaminophen (TYLENOL) 500 MG tablet Take 500 mg by mouth every 6 (six) hours as needed for moderate pain.   colestipol (COLESTID) 1 g tablet Take 1 g by mouth daily as needed (diarrhea).   diltiazem (DILACOR XR) 240 MG 24 hr  capsule Take 240 mg by mouth daily.   furosemide (LASIX) 20 MG tablet Take 20 mg by mouth daily.    glimepiride (AMARYL) 1 MG tablet Take 1 mg by mouth daily with breakfast.   ibuprofen (ADVIL,MOTRIN) 200 MG tablet Take 200 mg by mouth as needed for moderate pain.   losartan (COZAAR) 50 MG tablet Take 50 mg by mouth daily.   metFORMIN (GLUCOPHAGE) 500 MG tablet Take 1,000 mg by mouth 2 (two) times daily with a meal.   metoprolol tartrate (LOPRESSOR) 25 MG tablet Take 0.5 tablets (12.5 mg total) by mouth 2 (two) times daily.   pantoprazole (PROTONIX) 40 MG tablet Take 1 tablet (40 mg total) by mouth daily.   potassium chloride (MICRO-K) 10 MEQ CR capsule Take 10 mEq by mouth daily.   pravastatin (PRAVACHOL) 40 MG tablet Take 40 mg by mouth daily.   sertraline (ZOLOFT) 100 MG tablet Take 100 mg by mouth daily.   traZODone (DESYREL) 50 MG tablet Take 50 mg by mouth at bedtime.      Allergies:   Bextra [valdecoxib], Monopril [fosinopril], and Codeine   Social History   Socioeconomic History   Marital status: Widowed    Spouse name: Not on file   Number of children: Not on file   Years of education: Not on file   Highest education level: Not on file  Occupational History   Not on file  Tobacco Use   Smoking status: Never   Smokeless tobacco: Never  Vaping Use   Vaping Use: Never used  Substance and Sexual Activity   Alcohol use: No    Alcohol/week: 0.0 standard drinks   Drug use: No   Sexual activity: Never    Birth control/protection: Post-menopausal  Other Topics Concern   Not on file  Social History Narrative   Not on file   Social Determinants of Health   Financial Resource Strain: Not on file  Food Insecurity: Not on file  Transportation Needs: Not on file  Physical Activity: Not on file  Stress: Not on file  Social Connections: Not on file     Family History: The patient's family history includes AAA (abdominal aortic aneurysm) in her brother; Coronary artery  disease in her brother and brother; Diabetes in her brother and sister; Healthy in her mother and sister; Hodgkin's lymphoma in her father. ROS:   Please see the history of present illness.    All 14 point review of systems negative except as described per history of present illness  EKGs/Labs/Other Studies Reviewed:      Recent Labs: No results found for requested labs within last 8760 hours.  Recent Lipid Panel No results found for: CHOL, TRIG, HDL, CHOLHDL, VLDL, LDLCALC, LDLDIRECT  Physical Exam:    VS:  BP 140/70 (BP Location: Left Arm, Patient Position: Sitting, Cuff Size: Normal)   Pulse 64  Ht 5\' 1"  (1.549 m)   Wt 177 lb (80.3 kg)   SpO2 90%   BMI 33.44 kg/m     Wt Readings from Last 3 Encounters:  12/14/20 177 lb (80.3 kg)  06/12/20 173 lb (78.5 kg)  03/13/18 188 lb (85.3 kg)     GEN:  Well nourished, well developed in no acute distress HEENT: Normal NECK: No JVD; No carotid bruits LYMPHATICS: No lymphadenopathy CARDIAC: RRR, systolic ejection murmur grade 1/6 percent right upper portion of the sternum no rubs, no gallops RESPIRATORY:  Clear to auscultation without rales, wheezing or rhonchi  ABDOMEN: Soft, non-tender, non-distended MUSCULOSKELETAL:  No edema; No deformity  SKIN: Warm and dry LOWER EXTREMITIES: no swelling NEUROLOGIC:  Alert and oriented x 3 PSYCHIATRIC:  Normal affect   ASSESSMENT:    1. S/P TAVR (transcatheter aortic valve replacement)   2. Mixed hyperlipidemia   3. Hypertrophic obstructive cardiomyopathy (HCC)   4. Coronary artery disease involving native coronary artery of native heart without angina pectoris    PLAN:    In order of problems listed above:  Status post TAVR: I did review echocardiogram from March which looks good however she does have new symptoms there is a value of repeating echocardiogram make sure her ejection fraction is normal mitral valve is still functioning properly.  She does have some irregularity of  heartbeats today we will do EKG to confirm the rhythm Mixed dyslipidemia: I do not have her K PN showing cholesterol.  We will do the test.  She is taking pravastatin 40. Obstructive cardiomyopathy.  Again echocardiogram will be done to clarify that Coronary artery disease she did have some noncritical lesions on the cardiac cath in 2016 before her TAVI.  If there is still no explanation for her symptomatology we will consider doing another evaluation for ischemia.   Medication Adjustments/Labs and Tests Ordered: Current medicines are reviewed at length with the patient today.  Concerns regarding medicines are outlined above.  No orders of the defined types were placed in this encounter.  Medication changes: No orders of the defined types were placed in this encounter.   Signed, 2017, MD, [facility] 12/14/2020 1:10 PM    [facility]

## 2020-12-14 NOTE — Addendum Note (Signed)
Addended by: Roosvelt Harps R on: 12/14/2020 01:29 PM   Modules accepted: Orders

## 2020-12-14 NOTE — Patient Instructions (Signed)
Medication Instructions:  Your physician recommends that you continue on your current medications as directed. Please refer to the Current Medication list given to you today.  *If you need a refill on your cardiac medications before your next appointment, please call your pharmacy*   Lab Work: Your physician recommends that you return for lab work in: today for bmp and pro bnp  If you have labs (blood work) drawn today and your tests are completely normal, you will receive your results only by: MyChart Message (if you have MyChart) OR A paper copy in the mail If you have any lab test that is abnormal or we need to change your treatment, we will call you to review the results.   Testing/Procedures: Your physician has requested that you have an echocardiogram. Echocardiography is a painless test that uses sound waves to create images of your heart. It provides your doctor with information about the size and shape of your heart and how well your heart's chambers and valves are working. This procedure takes approximately one hour. There are no restrictions for this procedure.    Follow-Up: At [facility], you and your health needs are our priority.  As part of our continuing mission to provide you with exceptional heart care, we have created designated Provider Care Teams.  These Care Teams include your primary Cardiologist (physician) and Advanced Practice Providers (APPs -  Physician Assistants and Nurse Practitioners) who all work together to provide you with the care you need, when you need it.  We recommend signing up for the patient portal called "MyChart".  Sign up information is provided on this After Visit Summary.  MyChart is used to connect with patients for Virtual Visits (Telemedicine).  Patients are able to view lab/test results, encounter notes, upcoming appointments, etc.  Non-urgent messages can be sent to your provider as well.   To learn more about what you can do with  MyChart, go to [email or web address].    Your next appointment:   6 month(s)  The format for your next appointment:   In Person  Provider:   Gypsy Balsam, MD   Other Instructions

## 2020-12-15 LAB — BASIC METABOLIC PANEL
BUN/Creatinine Ratio: 8 — ABNORMAL LOW (ref 12–28)
BUN: 8 mg/dL (ref 8–27)
CO2: 22 mmol/L (ref 20–29)
Calcium: 8.5 mg/dL — ABNORMAL LOW (ref 8.7–10.3)
Chloride: 101 mmol/L (ref 96–106)
Creatinine, Ser: 0.96 mg/dL (ref 0.57–1.00)
Glucose: 138 mg/dL — ABNORMAL HIGH (ref 65–99)
Potassium: 3.9 mmol/L (ref 3.5–5.2)
Sodium: 143 mmol/L (ref 134–144)
eGFR: 58 mL/min/{1.73_m2} — ABNORMAL LOW (ref >59)

## 2020-12-15 LAB — PRO B NATRIURETIC PEPTIDE: NT-Pro BNP: 3298 pg/mL — ABNORMAL HIGH (ref 0–738)

## 2020-12-17 MED ORDER — FUROSEMIDE 40 MG PO TABS: 40.0000 mg | ORAL_TABLET | Freq: Every day | ORAL | 3 refills | Status: AC

## 2020-12-17 MED ORDER — POTASSIUM CHLORIDE ER 10 MEQ PO CPCR: 20.0000 meq | ORAL_CAPSULE | Freq: Every day | ORAL | 2 refills | Status: DC

## 2020-12-17 NOTE — Addendum Note (Signed)
Addended by: Eleonore Chiquito on: 12/17/2020 06:06 PM   Modules accepted: Orders

## 2020-12-22 MED ORDER — POTASSIUM CHLORIDE ER 10 MEQ PO CPCR: 20.0000 meq | ORAL_CAPSULE | Freq: Every day | ORAL | 2 refills | Status: AC

## 2020-12-24 LAB — BASIC METABOLIC PANEL
BUN/Creatinine Ratio: 15 (ref 12–28)
BUN: 15 mg/dL (ref 8–27)
CO2: 24 mmol/L (ref 20–29)
Calcium: 9.4 mg/dL (ref 8.7–10.3)
Chloride: 97 mmol/L (ref 96–106)
Creatinine, Ser: 0.97 mg/dL (ref 0.57–1.00)
Glucose: 207 mg/dL — ABNORMAL HIGH (ref 65–99)
Potassium: 4.2 mmol/L (ref 3.5–5.2)
Sodium: 138 mmol/L (ref 134–144)
eGFR: 57 mL/min/{1.73_m2} — ABNORMAL LOW (ref >59)

## 2020-12-29 ENCOUNTER — Other Ambulatory Visit: Payer: Self-pay

## 2020-12-29 ENCOUNTER — Ambulatory Visit (INDEPENDENT_AMBULATORY_CARE_PROVIDER_SITE_OTHER): Payer: Medicare Other

## 2020-12-29 DIAGNOSIS — E782 Mixed hyperlipidemia: Secondary | ICD-10-CM

## 2020-12-29 DIAGNOSIS — I421 Obstructive hypertrophic cardiomyopathy: Secondary | ICD-10-CM | POA: Diagnosis not present

## 2020-12-29 DIAGNOSIS — Z952 Presence of prosthetic heart valve: Secondary | ICD-10-CM

## 2020-12-29 DIAGNOSIS — I2510 Atherosclerotic heart disease of native coronary artery without angina pectoris: Secondary | ICD-10-CM | POA: Diagnosis not present

## 2020-12-29 LAB — ECHOCARDIOGRAM COMPLETE
AR max vel: 1.67 cm2
AV Area VTI: 1.91 cm2
AV Area mean vel: 1.88 cm2
AV Mean grad: 12.7 mmHg
AV Peak grad: 25.7 mmHg
Ao pk vel: 2.53 m/s
Area-P 1/2: 2.29 cm2
MV VTI: 1.36 cm2
S' Lateral: 2 cm

## 2020-12-30 ENCOUNTER — Telehealth: Payer: Self-pay

## 2020-12-30 NOTE — Telephone Encounter (Signed)
-----   Message from Georgeanna Lea, MD sent at 12/30/2020 12:31 PM EDT ----- Echocardiogram showed preserved ejection fraction moderate LVH diastolic dysfunction TAVI functioning well

## 2020-12-30 NOTE — Telephone Encounter (Signed)
Spoke with patient regarding results and recommendation.  Patient verbalizes understanding and is agreeable to plan of care. Advised patient to call back with any issues or concerns.  

## 2021-06-21 ENCOUNTER — Encounter: Payer: Self-pay | Admitting: Cardiology

## 2021-06-21 ENCOUNTER — Ambulatory Visit (INDEPENDENT_AMBULATORY_CARE_PROVIDER_SITE_OTHER): Payer: Medicare Other

## 2021-06-21 ENCOUNTER — Ambulatory Visit: Payer: Medicare Other | Admitting: Cardiology

## 2021-06-21 ENCOUNTER — Other Ambulatory Visit: Payer: Self-pay

## 2021-06-21 VITALS — BP 116/82 | HR 52 | Ht 63.5 in | Wt 169.8 lb

## 2021-06-21 DIAGNOSIS — I5032 Chronic diastolic (congestive) heart failure: Secondary | ICD-10-CM

## 2021-06-21 DIAGNOSIS — I350 Nonrheumatic aortic (valve) stenosis: Secondary | ICD-10-CM

## 2021-06-21 DIAGNOSIS — E119 Type 2 diabetes mellitus without complications: Secondary | ICD-10-CM

## 2021-06-21 DIAGNOSIS — Z952 Presence of prosthetic heart valve: Secondary | ICD-10-CM

## 2021-06-21 DIAGNOSIS — I421 Obstructive hypertrophic cardiomyopathy: Secondary | ICD-10-CM | POA: Diagnosis not present

## 2021-06-21 DIAGNOSIS — I2510 Atherosclerotic heart disease of native coronary artery without angina pectoris: Secondary | ICD-10-CM

## 2021-06-21 DIAGNOSIS — R42 Dizziness and giddiness: Secondary | ICD-10-CM

## 2021-06-21 DIAGNOSIS — I110 Hypertensive heart disease with heart failure: Secondary | ICD-10-CM

## 2021-06-21 MED ORDER — APIXABAN 5 MG PO TABS: 5.0000 mg | ORAL_TABLET | Freq: Two times a day (BID) | ORAL | 2 refills | Status: AC

## 2021-06-21 NOTE — Progress Notes (Signed)
Cardiology Office Note:    Date:  06/21/2021   ID:  Lori Gilbert, DOB 1933-02-13, MRN [id number]  PCP:  Suzan Garibaldi, FNP  Cardiologist:  Jenne Campus, MD    Referring MD: Suzan Garibaldi, FNP   Chief Complaint  Patient presents with   Dizziness   Shortness of Breath    Ongoing for weeks     History of Present Illness:    Lori Gilbert is a 88 y.o. female with past medical history significant for severe aortic stenosis, status post TAVI done in March 2016, she received 23 mm Edwards SAPIEN 3 transcatheter valve via right femoral approach.  At that time she had cardiac catheterization done which showed 20% RCA, 30 to 40% LAD disease.  She also has history of diabetes, dyslipidemia. She comes today to my office for follow-up.  She complain of having some dizziness that happen maybe every other day when she walks some time when she said she get dizzy she never passed out because of this she never fell down just dizziness feeling.  She also complained of having shortness of breath she admits that she does not do much.  She said she basically sits in the chair and walks very little when she does walk she gets short of breath.  Past Medical History:  Diagnosis Date   Anemia    takes Iron daily   Aortic stenosis    a. s/p TAVR 06/30/14   Aortic stenosis    severe    Arthritis    CAD (coronary artery disease), native coronary artery    Mild distal left main, 30-40% LAD, 20% RCA, normal circ at cath 04/23/14 Dr. Burt Knack    Diabetes mellitus type 2, noninsulin dependent ([place]) 03/25/2014   Diabetes mellitus without complication ([place])    takes Metformin and Amaryl daily   Dyspnea 03/25/2014   GERD (gastroesophageal reflux disease) 03/25/2014   takes Omeprazole daily   H/O hiatal hernia    History of bronchitis    couple of yrs ago   History of shingles    Hyperlipidemia    takes Pravastatin daily   Hypertension    takes Diltiazem and Cozaar daily   Hypertensive heart disease  03/25/2014   Hypertrophic obstructive cardiomyopathy ([place]) 03/25/2014   Joint pain    Nocturia    Obesity (BMI 30-39.9) 03/25/2014   Pulmonary hypertension, secondary 05/01/2014   S/P TAVR (transcatheter aortic valve replacement) 06/30/2014   23 mm Edwards Sapien 3 transcatheter heart valve placed via open right transfemoral approach   Severe aortic stenosis 06/30/2014    Past Surgical History:  Procedure Laterality Date   ANKLE FRACTURE SURGERY Bilateral    CARPAL TUNNEL RELEASE Right 05/13/2013   Procedure: RIGHT CARPAL TUNNEL RELEASE;  Surgeon: Yvette Rack., MD;  Location: [facility];  Service: Orthopedics;  Laterality: Right;   cataract surgery Bilateral    CHOLECYSTECTOMY     COLONOSCOPY     DILATION AND CURETTAGE OF UTERUS     LEFT AND RIGHT HEART CATHETERIZATION WITH CORONARY ANGIOGRAM N/A 04/23/2014   Procedure: LEFT AND RIGHT HEART CATHETERIZATION WITH CORONARY ANGIOGRAM;  Surgeon: Blane Ohara, MD;  Location: [facility] CATH LAB;  Service: Cardiovascular;  Laterality: N/A;   TEE WITHOUT CARDIOVERSION N/A 05/02/2014   Procedure: TRANSESOPHAGEAL ECHOCARDIOGRAM (TEE);  Surgeon: Josue Hector, MD;  Location[facility] ENDOSCOPY;  Service: Cardiovascular;  Laterality: N/A;   TEE WITHOUT CARDIOVERSION N/A 06/30/2014   Procedure: TRANSESOPHAGEAL ECHOCARDIOGRAM (TEE);  Surgeon: Legrand Como  Burt Knack, MD;  Location: [place];  Service: Open Heart Surgery;  Laterality: N/A;   TRANSCATHETER AORTIC VALVE REPLACEMENT, TRANSFEMORAL N/A 06/30/2014   Procedure: TRANSCATHETER AORTIC VALVE REPLACEMENT, TRANSFEMORAL;  Surgeon: Sherren Mocha, MD;  Location: [place];  Service: Open Heart Surgery;  Laterality: N/A;   WRIST FRACTURE SURGERY Left     Current Medications: Current Meds  Medication Sig   acetaminophen (TYLENOL) 500 MG tablet Take 500 mg by mouth every 6 (six) hours as needed for moderate pain.   colestipol (COLESTID) 1 g tablet Take 1 g by mouth daily as needed (diarrhea).   diltiazem (DILACOR XR)  240 MG 24 hr capsule Take 240 mg by mouth daily.   furosemide (LASIX) 40 MG tablet Take 1 tablet (40 mg total) by mouth daily.   glimepiride (AMARYL) 1 MG tablet Take 1 mg by mouth daily with breakfast.   ibuprofen (ADVIL,MOTRIN) 200 MG tablet Take 200 mg by mouth as needed for moderate pain.   losartan (COZAAR) 50 MG tablet Take 50 mg by mouth daily.   metFORMIN (GLUCOPHAGE) 500 MG tablet Take 1,000 mg by mouth 2 (two) times daily with a meal.   metoprolol tartrate (LOPRESSOR) 25 MG tablet Take 0.5 tablets (12.5 mg total) by mouth 2 (two) times daily.   pantoprazole (PROTONIX) 40 MG tablet Take 1 tablet (40 mg total) by mouth daily.   potassium chloride (MICRO-K) 10 MEQ CR capsule Take 2 capsules (20 mEq total) by mouth daily.   pravastatin (PRAVACHOL) 40 MG tablet Take 40 mg by mouth daily.   sertraline (ZOLOFT) 100 MG tablet Take 100 mg by mouth daily.   traZODone (DESYREL) 50 MG tablet Take 50 mg by mouth at bedtime.      Allergies:   Bextra [valdecoxib], Monopril [fosinopril], and Codeine   Social History   Socioeconomic History   Marital status: Widowed    Spouse name: Not on file   Number of children: Not on file   Years of education: Not on file   Highest education level: Not on file  Occupational History   Not on file  Tobacco Use   Smoking status: Never   Smokeless tobacco: Never  Vaping Use   Vaping Use: Never used  Substance and Sexual Activity   Alcohol use: No    Alcohol/week: 0.0 standard drinks   Drug use: No   Sexual activity: Never    Birth control/protection: Post-menopausal  Other Topics Concern   Not on file  Social History Narrative   Not on file   Social Determinants of Health   Financial Resource Strain: Not on file  Food Insecurity: Not on file  Transportation Needs: Not on file  Physical Activity: Not on file  Stress: Not on file  Social Connections: Not on file     Family History: The patient's family history includes AAA (abdominal  aortic aneurysm) in her brother; Coronary artery disease in her brother and brother; Diabetes in her brother and sister; Healthy in her mother and sister; Hodgkin's lymphoma in her father. ROS:   Please see the history of present illness.    All 14 point review of systems negative except as described per history of present illness  EKGs/Labs/Other Studies Reviewed:      Recent Labs: 12/14/2020: NT-Pro BNP 3,298 12/24/2020: BUN 15; Creatinine, Ser 0.97; Potassium 4.2; Sodium 138  Recent Lipid Panel No results found for: CHOL, TRIG, HDL, CHOLHDL, VLDL, LDLCALC, LDLDIRECT  Physical Exam:    VS:  BP 116/82 (BP Location:  Right Arm, Patient Position: Sitting)    Pulse (!) 52    Ht 5' 3.5" (1.613 m)    Wt 169 lb 12.8 oz (77 kg)    SpO2 91%    BMI 29.61 kg/m     Wt Readings from Last 3 Encounters:  06/21/21 169 lb 12.8 oz (77 kg)  12/14/20 177 lb (80.3 kg)  06/12/20 173 lb (78.5 kg)     GEN:  Well nourished, well developed in no acute distress HEENT: Normal NECK: No JVD; No carotid bruits LYMPHATICS: No lymphadenopathy CARDIAC: Tachycardic somewhat irregular, systolic ejection murmur grade 2/6 to 3/6 best heard right upper portion of sternum, no rubs, no gallops RESPIRATORY:  Clear to auscultation without rales, wheezing or rhonchi  ABDOMEN: Soft, non-tender, non-distended MUSCULOSKELETAL:  No edema; No deformity  SKIN: Warm and dry LOWER EXTREMITIES: no swelling NEUROLOGIC:  Alert and oriented x 3 PSYCHIATRIC:  Normal affect   ASSESSMENT:    1. Nonrheumatic aortic valve stenosis   2. S/P TAVR (transcatheter aortic valve replacement)   3. Diabetes mellitus type 2, noninsulin dependent ([place])   4. Hypertrophic obstructive cardiomyopathy ([place])   5. Hypertensive heart disease with chronic diastolic congestive heart failure ([place])   6. Coronary artery disease involving native coronary artery of native heart without angina pectoris    PLAN:    In order of problems listed  above:  Nonrheumatic aortic valve stenosis status post TAVI.  Echocardiogram done in December reviewed showed normal functioning valve. Dyspnea on exertion multifactorial.  Diastolic dysfunction play significant role here.  I will ask her to do Chem-7 as well as proBNP. She is tachycardic and somewhat irregular.  We will ask her to have EKG done today. Dizziness: We will ask her to wear Zio patch to make sure she does not have any significant arrhythmia of course concern is about AV block.  EKG done today showed atrial fibrillation with right bundle branch block.  Rate is 101.  I explained to patient as well as the daughter what the problem is I told her to biggest risk is a CVA.  We will do stool for guaiac CBC Chem-7 proBNP and then decide about anticoagulation  Medication Adjustments/Labs and Tests Ordered: Current medicines are reviewed at length with the patient today.  Concerns regarding medicines are outlined above.  No orders of the defined types were placed in this encounter.  Medication changes: No orders of the defined types were placed in this encounter.   Signed, Park Liter, MD, [facility] 06/21/2021 8:19 AM    [place]

## 2021-06-21 NOTE — Patient Instructions (Addendum)
Medication Instructions:  Your physician recommends that you continue on your current medications as directed. Please refer to the Current Medication list given to you today.  *If you need a refill on your cardiac medications before your next appointment, please call your pharmacy*   Lab Work: Your physician recommends that you return for lab work in:   Labs today: CBC, BMP, Pro BNP, stool for guaic  If you have labs (blood work) drawn today and your tests are completely normal, you will receive your results only by: MyChart Message (if you have MyChart) OR A paper copy in the mail If you have any lab test that is abnormal or we need to change your treatment, we will call you to review the results.   Testing/Procedures: A zio monitor was ordered today. It will remain on for 14 days. You will then return monitor and event diary in provided box. It takes 1-2 weeks for report to be downloaded and returned to [place]. We will call you with the results. If monitor falls off or has orange flashing light, please call Zio for further instructions.     Follow-Up: At [facility], you and your health needs are our priority.  As part of our continuing mission to provide you with exceptional heart care, we have created designated Provider Care Teams.  These Care Teams include your primary Cardiologist (physician) and Advanced Practice Providers (APPs -  Physician Assistants and Nurse Practitioners) who all work together to provide you with the care you need, when you need it.  We recommend signing up for the patient portal called "MyChart".  Sign up information is provided on this After Visit Summary.  MyChart is used to connect with patients for Virtual Visits (Telemedicine).  Patients are able to view lab/test results, encounter notes, upcoming appointments, etc.  Non-urgent messages can be sent to your provider as well.   To learn more about what you can do with MyChart, go to [email or web address].     Your next appointment:   1 month(s)  The format for your next appointment:   In Person  Provider:   Gypsy Balsam, MD    Other Instructions None

## 2021-06-22 LAB — PRO B NATRIURETIC PEPTIDE: NT-Pro BNP: 3403 pg/mL — ABNORMAL HIGH (ref 0–738)

## 2021-06-22 LAB — BASIC METABOLIC PANEL
BUN/Creatinine Ratio: 16 (ref 12–28)
BUN: 15 mg/dL (ref 8–27)
CO2: 24 mmol/L (ref 20–29)
Calcium: 8.7 mg/dL (ref 8.7–10.3)
Chloride: 103 mmol/L (ref 96–106)
Creatinine, Ser: 0.95 mg/dL (ref 0.57–1.00)
Glucose: 135 mg/dL — ABNORMAL HIGH (ref 70–99)
Potassium: 3.5 mmol/L (ref 3.5–5.2)
Sodium: 142 mmol/L (ref 134–144)
eGFR: 58 mL/min/{1.73_m2} — ABNORMAL LOW (ref >59)

## 2021-06-22 LAB — CBC
Hematocrit: 34.4 % (ref 34.0–46.6)
Hemoglobin: 11.2 g/dL (ref 11.1–15.9)
MCH: 28 pg (ref 26.6–33.0)
MCHC: 32.6 g/dL (ref 31.5–35.7)
MCV: 86 fL (ref 79–97)
Platelets: 174 10*3/uL (ref 150–450)
RBC: 4 x10E6/uL (ref 3.77–5.28)
RDW: 13.3 % (ref 11.7–15.4)
WBC: 5.4 10*3/uL (ref 3.4–10.8)

## 2021-06-23 ENCOUNTER — Encounter: Payer: Self-pay | Admitting: Cardiology

## 2021-06-23 LAB — FECAL OCCULT BLOOD, IMMUNOCHEMICAL: Fecal Occult Bld: NEGATIVE

## 2021-07-01 ENCOUNTER — Telehealth: Payer: Self-pay | Admitting: Cardiology

## 2021-07-01 NOTE — Telephone Encounter (Signed)
? ?  [place] [facility] calling to give critical result  ?

## 2021-07-01 NOTE — Telephone Encounter (Signed)
Called [facility] and they reported that the patient had multiple pauses totaling 19.2 seconds and a slow atrial fibrillation with a heart rate of 37 lasting for 60 seconds both occurring on March 1st. I informed Dr. Bing Matter of the critical alert and after reviewing the chart he saw where the patient had a pacemaker implanted today. No orders were given at this time. ?

## 2021-07-02 ENCOUNTER — Telehealth: Payer: Self-pay | Admitting: Cardiology

## 2021-07-02 ENCOUNTER — Other Ambulatory Visit: Payer: Self-pay

## 2021-07-02 DIAGNOSIS — I350 Nonrheumatic aortic (valve) stenosis: Secondary | ICD-10-CM

## 2021-07-02 NOTE — Telephone Encounter (Signed)
Patient's daughter sent mychart message to office for a two week follow up. Patient already has an appt 04/06 and is okay with keeping that appt. ? ?Pt's daughter would like for [place] to send a referral to an EP doctor within our network and also a vascular specialist. ? ?Best number to reach daughter [phone]  ? ?Thank you! ?

## 2021-07-02 NOTE — Telephone Encounter (Signed)
Called patient's daughter and informed her that Dr. Bing Matter agreed with a referral to the EP team but did not feel that she needed to be referred to a vascular specialist. The patient's daughter was agreeable with this and had no further questions. ?

## 2021-07-02 NOTE — Telephone Encounter (Signed)
Patient was seen in the [place] office on 2/27 because she had been passing out. After the visit patient's daughter took the patient back to [facility] with her to help take care of her. As a result of her passing out she ended having a pacemaker implanted. The health system also did a test that showed evidence of a stroke in the past. They did an ultrasound of her carotids that showed 40 - 60 % bilateral stenosis. The daughter is going to move the patient back into our area and would like to have all of her services with one health system. She is requesting a referral to EP and also a referral to a vascular specialist for her mother.    ?

## 2021-07-16 ENCOUNTER — Encounter: Payer: Self-pay | Admitting: Cardiology

## 2021-07-16 ENCOUNTER — Telehealth: Payer: Self-pay

## 2021-07-16 NOTE — Telephone Encounter (Signed)
-----   Message from Georgeanna Lea, MD sent at 07/07/2021  9:36 AM EDT ----- ?Please call her and ask her how she is doing after pacemaker implantation.  If she is still short of breath and that she still have some swelling? ?

## 2021-07-16 NOTE — Telephone Encounter (Signed)
Unable to reach the patient. Left a message to return my call and mailed a letter requesting a call back.  ?

## 2021-07-27 DIAGNOSIS — E118 Type 2 diabetes mellitus with unspecified complications: Secondary | ICD-10-CM | POA: Diagnosis not present

## 2021-07-27 DIAGNOSIS — I69314 Frontal lobe and executive function deficit following cerebral infarction: Secondary | ICD-10-CM | POA: Diagnosis not present

## 2021-07-27 DIAGNOSIS — I5032 Chronic diastolic (congestive) heart failure: Secondary | ICD-10-CM | POA: Diagnosis not present

## 2021-07-27 DIAGNOSIS — G9001 Carotid sinus syncope: Secondary | ICD-10-CM | POA: Diagnosis not present

## 2021-07-27 DIAGNOSIS — I6523 Occlusion and stenosis of bilateral carotid arteries: Secondary | ICD-10-CM | POA: Diagnosis not present

## 2021-07-27 DIAGNOSIS — I4891 Unspecified atrial fibrillation: Secondary | ICD-10-CM | POA: Diagnosis not present

## 2021-07-27 DIAGNOSIS — M47812 Spondylosis without myelopathy or radiculopathy, cervical region: Secondary | ICD-10-CM | POA: Diagnosis not present

## 2021-07-27 DIAGNOSIS — Z950 Presence of cardiac pacemaker: Secondary | ICD-10-CM | POA: Diagnosis not present

## 2021-07-27 DIAGNOSIS — I639 Cerebral infarction, unspecified: Secondary | ICD-10-CM | POA: Diagnosis not present

## 2021-07-27 DIAGNOSIS — R911 Solitary pulmonary nodule: Secondary | ICD-10-CM | POA: Diagnosis not present

## 2021-07-27 DIAGNOSIS — I5033 Acute on chronic diastolic (congestive) heart failure: Secondary | ICD-10-CM | POA: Diagnosis not present

## 2021-07-28 DIAGNOSIS — I480 Paroxysmal atrial fibrillation: Secondary | ICD-10-CM | POA: Diagnosis not present

## 2021-07-28 DIAGNOSIS — I6523 Occlusion and stenosis of bilateral carotid arteries: Secondary | ICD-10-CM | POA: Diagnosis not present

## 2021-07-28 DIAGNOSIS — Z950 Presence of cardiac pacemaker: Secondary | ICD-10-CM | POA: Diagnosis not present

## 2021-07-28 DIAGNOSIS — I445 Left posterior fascicular block: Secondary | ICD-10-CM | POA: Diagnosis not present

## 2021-07-28 DIAGNOSIS — I110 Hypertensive heart disease with heart failure: Secondary | ICD-10-CM | POA: Diagnosis not present

## 2021-07-28 DIAGNOSIS — Z7984 Long term (current) use of oral hypoglycemic drugs: Secondary | ICD-10-CM | POA: Diagnosis not present

## 2021-07-28 DIAGNOSIS — I5030 Unspecified diastolic (congestive) heart failure: Secondary | ICD-10-CM | POA: Diagnosis not present

## 2021-07-28 DIAGNOSIS — R911 Solitary pulmonary nodule: Secondary | ICD-10-CM | POA: Diagnosis not present

## 2021-07-28 DIAGNOSIS — I69314 Frontal lobe and executive function deficit following cerebral infarction: Secondary | ICD-10-CM | POA: Diagnosis not present

## 2021-07-28 DIAGNOSIS — Z48812 Encounter for surgical aftercare following surgery on the circulatory system: Secondary | ICD-10-CM | POA: Diagnosis not present

## 2021-07-28 DIAGNOSIS — I2510 Atherosclerotic heart disease of native coronary artery without angina pectoris: Secondary | ICD-10-CM | POA: Diagnosis not present

## 2021-07-28 DIAGNOSIS — E119 Type 2 diabetes mellitus without complications: Secondary | ICD-10-CM | POA: Diagnosis not present

## 2021-07-28 DIAGNOSIS — I421 Obstructive hypertrophic cardiomyopathy: Secondary | ICD-10-CM | POA: Diagnosis not present

## 2021-07-29 ENCOUNTER — Ambulatory Visit: Payer: Medicare Other | Admitting: Cardiology

## 2021-08-02 ENCOUNTER — Ambulatory Visit: Payer: Self-pay | Admitting: Nurse Practitioner

## 2021-08-05 DIAGNOSIS — E119 Type 2 diabetes mellitus without complications: Secondary | ICD-10-CM | POA: Diagnosis not present

## 2021-08-06 DIAGNOSIS — I2510 Atherosclerotic heart disease of native coronary artery without angina pectoris: Secondary | ICD-10-CM | POA: Diagnosis not present

## 2021-08-06 DIAGNOSIS — E119 Type 2 diabetes mellitus without complications: Secondary | ICD-10-CM | POA: Diagnosis not present

## 2021-08-06 DIAGNOSIS — I110 Hypertensive heart disease with heart failure: Secondary | ICD-10-CM | POA: Diagnosis not present

## 2021-08-06 DIAGNOSIS — I5030 Unspecified diastolic (congestive) heart failure: Secondary | ICD-10-CM | POA: Diagnosis not present

## 2021-08-06 DIAGNOSIS — Z48812 Encounter for surgical aftercare following surgery on the circulatory system: Secondary | ICD-10-CM | POA: Diagnosis not present

## 2021-08-06 DIAGNOSIS — I421 Obstructive hypertrophic cardiomyopathy: Secondary | ICD-10-CM | POA: Diagnosis not present

## 2021-08-06 DIAGNOSIS — Z7984 Long term (current) use of oral hypoglycemic drugs: Secondary | ICD-10-CM | POA: Diagnosis not present

## 2021-08-06 DIAGNOSIS — I445 Left posterior fascicular block: Secondary | ICD-10-CM | POA: Diagnosis not present

## 2021-08-06 DIAGNOSIS — R911 Solitary pulmonary nodule: Secondary | ICD-10-CM | POA: Diagnosis not present

## 2021-08-06 DIAGNOSIS — I69314 Frontal lobe and executive function deficit following cerebral infarction: Secondary | ICD-10-CM | POA: Diagnosis not present

## 2021-08-06 DIAGNOSIS — I6523 Occlusion and stenosis of bilateral carotid arteries: Secondary | ICD-10-CM | POA: Diagnosis not present

## 2021-08-06 DIAGNOSIS — Z950 Presence of cardiac pacemaker: Secondary | ICD-10-CM | POA: Diagnosis not present

## 2021-08-06 DIAGNOSIS — I480 Paroxysmal atrial fibrillation: Secondary | ICD-10-CM | POA: Diagnosis not present

## 2021-08-11 DIAGNOSIS — I2510 Atherosclerotic heart disease of native coronary artery without angina pectoris: Secondary | ICD-10-CM | POA: Diagnosis not present

## 2021-08-11 DIAGNOSIS — I421 Obstructive hypertrophic cardiomyopathy: Secondary | ICD-10-CM | POA: Diagnosis not present

## 2021-08-11 DIAGNOSIS — I445 Left posterior fascicular block: Secondary | ICD-10-CM | POA: Diagnosis not present

## 2021-08-11 DIAGNOSIS — R911 Solitary pulmonary nodule: Secondary | ICD-10-CM | POA: Diagnosis not present

## 2021-08-11 DIAGNOSIS — E119 Type 2 diabetes mellitus without complications: Secondary | ICD-10-CM | POA: Diagnosis not present

## 2021-08-11 DIAGNOSIS — Z7984 Long term (current) use of oral hypoglycemic drugs: Secondary | ICD-10-CM | POA: Diagnosis not present

## 2021-08-11 DIAGNOSIS — I6523 Occlusion and stenosis of bilateral carotid arteries: Secondary | ICD-10-CM | POA: Diagnosis not present

## 2021-08-11 DIAGNOSIS — I5030 Unspecified diastolic (congestive) heart failure: Secondary | ICD-10-CM | POA: Diagnosis not present

## 2021-08-11 DIAGNOSIS — I69314 Frontal lobe and executive function deficit following cerebral infarction: Secondary | ICD-10-CM | POA: Diagnosis not present

## 2021-08-11 DIAGNOSIS — I480 Paroxysmal atrial fibrillation: Secondary | ICD-10-CM | POA: Diagnosis not present

## 2021-08-11 DIAGNOSIS — I110 Hypertensive heart disease with heart failure: Secondary | ICD-10-CM | POA: Diagnosis not present

## 2021-08-11 DIAGNOSIS — Z48812 Encounter for surgical aftercare following surgery on the circulatory system: Secondary | ICD-10-CM | POA: Diagnosis not present

## 2021-08-11 DIAGNOSIS — Z950 Presence of cardiac pacemaker: Secondary | ICD-10-CM | POA: Diagnosis not present

## 2021-08-17 DIAGNOSIS — I2510 Atherosclerotic heart disease of native coronary artery without angina pectoris: Secondary | ICD-10-CM | POA: Diagnosis not present

## 2021-08-17 DIAGNOSIS — I5030 Unspecified diastolic (congestive) heart failure: Secondary | ICD-10-CM | POA: Diagnosis not present

## 2021-08-17 DIAGNOSIS — Z950 Presence of cardiac pacemaker: Secondary | ICD-10-CM | POA: Diagnosis not present

## 2021-08-17 DIAGNOSIS — I6523 Occlusion and stenosis of bilateral carotid arteries: Secondary | ICD-10-CM | POA: Diagnosis not present

## 2021-08-17 DIAGNOSIS — I445 Left posterior fascicular block: Secondary | ICD-10-CM | POA: Diagnosis not present

## 2021-08-17 DIAGNOSIS — Z48812 Encounter for surgical aftercare following surgery on the circulatory system: Secondary | ICD-10-CM | POA: Diagnosis not present

## 2021-08-17 DIAGNOSIS — I480 Paroxysmal atrial fibrillation: Secondary | ICD-10-CM | POA: Diagnosis not present

## 2021-08-17 DIAGNOSIS — I421 Obstructive hypertrophic cardiomyopathy: Secondary | ICD-10-CM | POA: Diagnosis not present

## 2021-08-17 DIAGNOSIS — I69314 Frontal lobe and executive function deficit following cerebral infarction: Secondary | ICD-10-CM | POA: Diagnosis not present

## 2021-08-17 DIAGNOSIS — E119 Type 2 diabetes mellitus without complications: Secondary | ICD-10-CM | POA: Diagnosis not present

## 2021-08-17 DIAGNOSIS — I110 Hypertensive heart disease with heart failure: Secondary | ICD-10-CM | POA: Diagnosis not present

## 2021-08-17 DIAGNOSIS — R911 Solitary pulmonary nodule: Secondary | ICD-10-CM | POA: Diagnosis not present

## 2021-08-17 DIAGNOSIS — Z7984 Long term (current) use of oral hypoglycemic drugs: Secondary | ICD-10-CM | POA: Diagnosis not present

## 2021-08-18 DIAGNOSIS — I445 Left posterior fascicular block: Secondary | ICD-10-CM | POA: Diagnosis not present

## 2021-08-18 DIAGNOSIS — I110 Hypertensive heart disease with heart failure: Secondary | ICD-10-CM | POA: Diagnosis not present

## 2021-08-18 DIAGNOSIS — I6523 Occlusion and stenosis of bilateral carotid arteries: Secondary | ICD-10-CM | POA: Diagnosis not present

## 2021-08-18 DIAGNOSIS — I69314 Frontal lobe and executive function deficit following cerebral infarction: Secondary | ICD-10-CM | POA: Diagnosis not present

## 2021-08-18 DIAGNOSIS — I480 Paroxysmal atrial fibrillation: Secondary | ICD-10-CM | POA: Diagnosis not present

## 2021-08-18 DIAGNOSIS — Z7984 Long term (current) use of oral hypoglycemic drugs: Secondary | ICD-10-CM | POA: Diagnosis not present

## 2021-08-18 DIAGNOSIS — I421 Obstructive hypertrophic cardiomyopathy: Secondary | ICD-10-CM | POA: Diagnosis not present

## 2021-08-18 DIAGNOSIS — Z950 Presence of cardiac pacemaker: Secondary | ICD-10-CM | POA: Diagnosis not present

## 2021-08-18 DIAGNOSIS — R911 Solitary pulmonary nodule: Secondary | ICD-10-CM | POA: Diagnosis not present

## 2021-08-18 DIAGNOSIS — E119 Type 2 diabetes mellitus without complications: Secondary | ICD-10-CM | POA: Diagnosis not present

## 2021-08-18 DIAGNOSIS — I5030 Unspecified diastolic (congestive) heart failure: Secondary | ICD-10-CM | POA: Diagnosis not present

## 2021-08-18 DIAGNOSIS — I2510 Atherosclerotic heart disease of native coronary artery without angina pectoris: Secondary | ICD-10-CM | POA: Diagnosis not present

## 2021-08-18 DIAGNOSIS — Z48812 Encounter for surgical aftercare following surgery on the circulatory system: Secondary | ICD-10-CM | POA: Diagnosis not present

## 2021-08-19 DIAGNOSIS — E782 Mixed hyperlipidemia: Secondary | ICD-10-CM | POA: Diagnosis not present

## 2021-08-26 DIAGNOSIS — I445 Left posterior fascicular block: Secondary | ICD-10-CM | POA: Diagnosis not present

## 2021-08-26 DIAGNOSIS — I5030 Unspecified diastolic (congestive) heart failure: Secondary | ICD-10-CM | POA: Diagnosis not present

## 2021-08-26 DIAGNOSIS — Z48812 Encounter for surgical aftercare following surgery on the circulatory system: Secondary | ICD-10-CM | POA: Diagnosis not present

## 2021-08-26 DIAGNOSIS — I69314 Frontal lobe and executive function deficit following cerebral infarction: Secondary | ICD-10-CM | POA: Diagnosis not present

## 2021-08-26 DIAGNOSIS — E119 Type 2 diabetes mellitus without complications: Secondary | ICD-10-CM | POA: Diagnosis not present

## 2021-08-26 DIAGNOSIS — I110 Hypertensive heart disease with heart failure: Secondary | ICD-10-CM | POA: Diagnosis not present

## 2021-08-26 DIAGNOSIS — I2510 Atherosclerotic heart disease of native coronary artery without angina pectoris: Secondary | ICD-10-CM | POA: Diagnosis not present

## 2021-08-26 DIAGNOSIS — Z7984 Long term (current) use of oral hypoglycemic drugs: Secondary | ICD-10-CM | POA: Diagnosis not present

## 2021-08-26 DIAGNOSIS — I421 Obstructive hypertrophic cardiomyopathy: Secondary | ICD-10-CM | POA: Diagnosis not present

## 2021-08-26 DIAGNOSIS — R911 Solitary pulmonary nodule: Secondary | ICD-10-CM | POA: Diagnosis not present

## 2021-08-26 DIAGNOSIS — I6523 Occlusion and stenosis of bilateral carotid arteries: Secondary | ICD-10-CM | POA: Diagnosis not present

## 2021-08-26 DIAGNOSIS — Z950 Presence of cardiac pacemaker: Secondary | ICD-10-CM | POA: Diagnosis not present

## 2021-08-26 DIAGNOSIS — I480 Paroxysmal atrial fibrillation: Secondary | ICD-10-CM | POA: Diagnosis not present

## 2021-08-31 DIAGNOSIS — I421 Obstructive hypertrophic cardiomyopathy: Secondary | ICD-10-CM | POA: Diagnosis not present

## 2021-08-31 DIAGNOSIS — R911 Solitary pulmonary nodule: Secondary | ICD-10-CM | POA: Diagnosis not present

## 2021-08-31 DIAGNOSIS — I5030 Unspecified diastolic (congestive) heart failure: Secondary | ICD-10-CM | POA: Diagnosis not present

## 2021-08-31 DIAGNOSIS — I480 Paroxysmal atrial fibrillation: Secondary | ICD-10-CM | POA: Diagnosis not present

## 2021-08-31 DIAGNOSIS — I6523 Occlusion and stenosis of bilateral carotid arteries: Secondary | ICD-10-CM | POA: Diagnosis not present

## 2021-08-31 DIAGNOSIS — I445 Left posterior fascicular block: Secondary | ICD-10-CM | POA: Diagnosis not present

## 2021-08-31 DIAGNOSIS — I110 Hypertensive heart disease with heart failure: Secondary | ICD-10-CM | POA: Diagnosis not present

## 2021-08-31 DIAGNOSIS — E119 Type 2 diabetes mellitus without complications: Secondary | ICD-10-CM | POA: Diagnosis not present

## 2021-08-31 DIAGNOSIS — Z950 Presence of cardiac pacemaker: Secondary | ICD-10-CM | POA: Diagnosis not present

## 2021-08-31 DIAGNOSIS — I2510 Atherosclerotic heart disease of native coronary artery without angina pectoris: Secondary | ICD-10-CM | POA: Diagnosis not present

## 2021-08-31 DIAGNOSIS — Z7984 Long term (current) use of oral hypoglycemic drugs: Secondary | ICD-10-CM | POA: Diagnosis not present

## 2021-08-31 DIAGNOSIS — Z48812 Encounter for surgical aftercare following surgery on the circulatory system: Secondary | ICD-10-CM | POA: Diagnosis not present

## 2021-08-31 DIAGNOSIS — I69314 Frontal lobe and executive function deficit following cerebral infarction: Secondary | ICD-10-CM | POA: Diagnosis not present

## 2021-09-03 DIAGNOSIS — I69314 Frontal lobe and executive function deficit following cerebral infarction: Secondary | ICD-10-CM | POA: Diagnosis not present

## 2021-09-07 DIAGNOSIS — R634 Abnormal weight loss: Secondary | ICD-10-CM | POA: Diagnosis not present

## 2021-09-08 DIAGNOSIS — I69314 Frontal lobe and executive function deficit following cerebral infarction: Secondary | ICD-10-CM | POA: Diagnosis not present

## 2021-09-08 DIAGNOSIS — I421 Obstructive hypertrophic cardiomyopathy: Secondary | ICD-10-CM | POA: Diagnosis not present

## 2021-09-08 DIAGNOSIS — I110 Hypertensive heart disease with heart failure: Secondary | ICD-10-CM | POA: Diagnosis not present

## 2021-09-08 DIAGNOSIS — Z7984 Long term (current) use of oral hypoglycemic drugs: Secondary | ICD-10-CM | POA: Diagnosis not present

## 2021-09-08 DIAGNOSIS — I480 Paroxysmal atrial fibrillation: Secondary | ICD-10-CM | POA: Diagnosis not present

## 2021-09-08 DIAGNOSIS — I445 Left posterior fascicular block: Secondary | ICD-10-CM | POA: Diagnosis not present

## 2021-09-08 DIAGNOSIS — Z950 Presence of cardiac pacemaker: Secondary | ICD-10-CM | POA: Diagnosis not present

## 2021-09-08 DIAGNOSIS — E119 Type 2 diabetes mellitus without complications: Secondary | ICD-10-CM | POA: Diagnosis not present

## 2021-09-08 DIAGNOSIS — I2510 Atherosclerotic heart disease of native coronary artery without angina pectoris: Secondary | ICD-10-CM | POA: Diagnosis not present

## 2021-09-08 DIAGNOSIS — R911 Solitary pulmonary nodule: Secondary | ICD-10-CM | POA: Diagnosis not present

## 2021-09-08 DIAGNOSIS — I6523 Occlusion and stenosis of bilateral carotid arteries: Secondary | ICD-10-CM | POA: Diagnosis not present

## 2021-09-08 DIAGNOSIS — I5030 Unspecified diastolic (congestive) heart failure: Secondary | ICD-10-CM | POA: Diagnosis not present

## 2021-09-10 DIAGNOSIS — I2510 Atherosclerotic heart disease of native coronary artery without angina pectoris: Secondary | ICD-10-CM | POA: Diagnosis not present

## 2021-09-10 DIAGNOSIS — I5030 Unspecified diastolic (congestive) heart failure: Secondary | ICD-10-CM | POA: Diagnosis not present

## 2021-09-10 DIAGNOSIS — E119 Type 2 diabetes mellitus without complications: Secondary | ICD-10-CM | POA: Diagnosis not present

## 2021-09-10 DIAGNOSIS — I6523 Occlusion and stenosis of bilateral carotid arteries: Secondary | ICD-10-CM | POA: Diagnosis not present

## 2021-09-10 DIAGNOSIS — I480 Paroxysmal atrial fibrillation: Secondary | ICD-10-CM | POA: Diagnosis not present

## 2021-09-10 DIAGNOSIS — I421 Obstructive hypertrophic cardiomyopathy: Secondary | ICD-10-CM | POA: Diagnosis not present

## 2021-09-10 DIAGNOSIS — Z7984 Long term (current) use of oral hypoglycemic drugs: Secondary | ICD-10-CM | POA: Diagnosis not present

## 2021-09-10 DIAGNOSIS — I445 Left posterior fascicular block: Secondary | ICD-10-CM | POA: Diagnosis not present

## 2021-09-10 DIAGNOSIS — Z950 Presence of cardiac pacemaker: Secondary | ICD-10-CM | POA: Diagnosis not present

## 2021-09-10 DIAGNOSIS — I69314 Frontal lobe and executive function deficit following cerebral infarction: Secondary | ICD-10-CM | POA: Diagnosis not present

## 2021-09-10 DIAGNOSIS — R911 Solitary pulmonary nodule: Secondary | ICD-10-CM | POA: Diagnosis not present

## 2021-09-10 DIAGNOSIS — I110 Hypertensive heart disease with heart failure: Secondary | ICD-10-CM | POA: Diagnosis not present

## 2021-09-13 DIAGNOSIS — I445 Left posterior fascicular block: Secondary | ICD-10-CM | POA: Diagnosis not present

## 2021-09-13 DIAGNOSIS — I480 Paroxysmal atrial fibrillation: Secondary | ICD-10-CM | POA: Diagnosis not present

## 2021-09-13 DIAGNOSIS — I5030 Unspecified diastolic (congestive) heart failure: Secondary | ICD-10-CM | POA: Diagnosis not present

## 2021-09-13 DIAGNOSIS — R911 Solitary pulmonary nodule: Secondary | ICD-10-CM | POA: Diagnosis not present

## 2021-09-13 DIAGNOSIS — E119 Type 2 diabetes mellitus without complications: Secondary | ICD-10-CM | POA: Diagnosis not present

## 2021-09-13 DIAGNOSIS — I110 Hypertensive heart disease with heart failure: Secondary | ICD-10-CM | POA: Diagnosis not present

## 2021-09-13 DIAGNOSIS — I69314 Frontal lobe and executive function deficit following cerebral infarction: Secondary | ICD-10-CM | POA: Diagnosis not present

## 2021-09-13 DIAGNOSIS — I2510 Atherosclerotic heart disease of native coronary artery without angina pectoris: Secondary | ICD-10-CM | POA: Diagnosis not present

## 2021-09-13 DIAGNOSIS — I421 Obstructive hypertrophic cardiomyopathy: Secondary | ICD-10-CM | POA: Diagnosis not present

## 2021-09-13 DIAGNOSIS — I6523 Occlusion and stenosis of bilateral carotid arteries: Secondary | ICD-10-CM | POA: Diagnosis not present

## 2021-09-13 DIAGNOSIS — Z950 Presence of cardiac pacemaker: Secondary | ICD-10-CM | POA: Diagnosis not present

## 2021-09-13 DIAGNOSIS — Z7984 Long term (current) use of oral hypoglycemic drugs: Secondary | ICD-10-CM | POA: Diagnosis not present

## 2021-09-14 DIAGNOSIS — N10 Acute tubulo-interstitial nephritis: Secondary | ICD-10-CM | POA: Diagnosis not present

## 2021-09-14 DIAGNOSIS — Z8673 Personal history of transient ischemic attack (TIA), and cerebral infarction without residual deficits: Secondary | ICD-10-CM | POA: Diagnosis not present

## 2021-09-14 DIAGNOSIS — Z7409 Other reduced mobility: Secondary | ICD-10-CM | POA: Diagnosis not present

## 2021-09-14 DIAGNOSIS — I517 Cardiomegaly: Secondary | ICD-10-CM | POA: Diagnosis not present

## 2021-09-14 DIAGNOSIS — Z7984 Long term (current) use of oral hypoglycemic drugs: Secondary | ICD-10-CM | POA: Diagnosis not present

## 2021-09-14 DIAGNOSIS — R634 Abnormal weight loss: Secondary | ICD-10-CM | POA: Diagnosis not present

## 2021-09-14 DIAGNOSIS — Z9181 History of falling: Secondary | ICD-10-CM | POA: Diagnosis not present

## 2021-09-14 DIAGNOSIS — E639 Nutritional deficiency, unspecified: Secondary | ICD-10-CM | POA: Diagnosis not present

## 2021-09-14 DIAGNOSIS — E8770 Fluid overload, unspecified: Secondary | ICD-10-CM | POA: Diagnosis not present

## 2021-09-14 DIAGNOSIS — R911 Solitary pulmonary nodule: Secondary | ICD-10-CM | POA: Diagnosis not present

## 2021-09-14 DIAGNOSIS — D649 Anemia, unspecified: Secondary | ICD-10-CM | POA: Diagnosis not present

## 2021-09-14 DIAGNOSIS — I272 Pulmonary hypertension, unspecified: Secondary | ICD-10-CM | POA: Diagnosis not present

## 2021-09-14 DIAGNOSIS — E119 Type 2 diabetes mellitus without complications: Secondary | ICD-10-CM | POA: Diagnosis not present

## 2021-09-14 DIAGNOSIS — N323 Diverticulum of bladder: Secondary | ICD-10-CM | POA: Diagnosis not present

## 2021-09-14 DIAGNOSIS — I4891 Unspecified atrial fibrillation: Secondary | ICD-10-CM | POA: Diagnosis not present

## 2021-09-14 DIAGNOSIS — N390 Urinary tract infection, site not specified: Secondary | ICD-10-CM | POA: Diagnosis not present

## 2021-09-14 DIAGNOSIS — R609 Edema, unspecified: Secondary | ICD-10-CM | POA: Diagnosis not present

## 2021-09-14 DIAGNOSIS — I5030 Unspecified diastolic (congestive) heart failure: Secondary | ICD-10-CM | POA: Diagnosis not present

## 2021-09-14 DIAGNOSIS — I110 Hypertensive heart disease with heart failure: Secondary | ICD-10-CM | POA: Diagnosis not present

## 2021-09-14 DIAGNOSIS — I480 Paroxysmal atrial fibrillation: Secondary | ICD-10-CM | POA: Diagnosis not present

## 2021-09-14 DIAGNOSIS — M6259 Muscle wasting and atrophy, not elsewhere classified, multiple sites: Secondary | ICD-10-CM | POA: Diagnosis not present

## 2021-09-14 DIAGNOSIS — R319 Hematuria, unspecified: Secondary | ICD-10-CM | POA: Diagnosis not present

## 2021-09-14 DIAGNOSIS — I445 Left posterior fascicular block: Secondary | ICD-10-CM | POA: Diagnosis not present

## 2021-09-14 DIAGNOSIS — I4892 Unspecified atrial flutter: Secondary | ICD-10-CM | POA: Diagnosis not present

## 2021-09-14 DIAGNOSIS — E785 Hyperlipidemia, unspecified: Secondary | ICD-10-CM | POA: Diagnosis not present

## 2021-09-14 DIAGNOSIS — I878 Other specified disorders of veins: Secondary | ICD-10-CM | POA: Diagnosis not present

## 2021-09-14 DIAGNOSIS — R6520 Severe sepsis without septic shock: Secondary | ICD-10-CM | POA: Diagnosis not present

## 2021-09-14 DIAGNOSIS — I2510 Atherosclerotic heart disease of native coronary artery without angina pectoris: Secondary | ICD-10-CM | POA: Diagnosis not present

## 2021-09-14 DIAGNOSIS — R269 Unspecified abnormalities of gait and mobility: Secondary | ICD-10-CM | POA: Diagnosis not present

## 2021-09-14 DIAGNOSIS — M6281 Muscle weakness (generalized): Secondary | ICD-10-CM | POA: Diagnosis not present

## 2021-09-14 DIAGNOSIS — E875 Hyperkalemia: Secondary | ICD-10-CM | POA: Diagnosis not present

## 2021-09-14 DIAGNOSIS — E872 Acidosis, unspecified: Secondary | ICD-10-CM | POA: Diagnosis not present

## 2021-09-14 DIAGNOSIS — Z7901 Long term (current) use of anticoagulants: Secondary | ICD-10-CM | POA: Diagnosis not present

## 2021-09-14 DIAGNOSIS — I421 Obstructive hypertrophic cardiomyopathy: Secondary | ICD-10-CM | POA: Diagnosis not present

## 2021-09-14 DIAGNOSIS — R7402 Elevation of levels of lactic acid dehydrogenase (LDH): Secondary | ICD-10-CM | POA: Diagnosis not present

## 2021-09-14 DIAGNOSIS — R2243 Localized swelling, mass and lump, lower limb, bilateral: Secondary | ICD-10-CM | POA: Diagnosis not present

## 2021-09-14 DIAGNOSIS — I081 Rheumatic disorders of both mitral and tricuspid valves: Secondary | ICD-10-CM | POA: Diagnosis not present

## 2021-09-14 DIAGNOSIS — I639 Cerebral infarction, unspecified: Secondary | ICD-10-CM | POA: Diagnosis not present

## 2021-09-14 DIAGNOSIS — N12 Tubulo-interstitial nephritis, not specified as acute or chronic: Secondary | ICD-10-CM | POA: Diagnosis not present

## 2021-09-14 DIAGNOSIS — Z950 Presence of cardiac pacemaker: Secondary | ICD-10-CM | POA: Diagnosis not present

## 2021-09-14 DIAGNOSIS — I050 Rheumatic mitral stenosis: Secondary | ICD-10-CM | POA: Diagnosis not present

## 2021-09-14 DIAGNOSIS — R278 Other lack of coordination: Secondary | ICD-10-CM | POA: Diagnosis not present

## 2021-09-14 DIAGNOSIS — G9341 Metabolic encephalopathy: Secondary | ICD-10-CM | POA: Diagnosis not present

## 2021-09-14 DIAGNOSIS — J90 Pleural effusion, not elsewhere classified: Secondary | ICD-10-CM | POA: Diagnosis not present

## 2021-09-14 DIAGNOSIS — I69314 Frontal lobe and executive function deficit following cerebral infarction: Secondary | ICD-10-CM | POA: Diagnosis not present

## 2021-09-14 DIAGNOSIS — N179 Acute kidney failure, unspecified: Secondary | ICD-10-CM | POA: Diagnosis not present

## 2021-09-14 DIAGNOSIS — R9431 Abnormal electrocardiogram [ECG] [EKG]: Secondary | ICD-10-CM | POA: Diagnosis not present

## 2021-09-14 DIAGNOSIS — R262 Difficulty in walking, not elsewhere classified: Secondary | ICD-10-CM | POA: Diagnosis not present

## 2021-09-14 DIAGNOSIS — I6523 Occlusion and stenosis of bilateral carotid arteries: Secondary | ICD-10-CM | POA: Diagnosis not present

## 2021-09-14 DIAGNOSIS — N136 Pyonephrosis: Secondary | ICD-10-CM | POA: Diagnosis not present

## 2021-09-14 DIAGNOSIS — R531 Weakness: Secondary | ICD-10-CM | POA: Diagnosis not present

## 2021-09-15 DIAGNOSIS — I6523 Occlusion and stenosis of bilateral carotid arteries: Secondary | ICD-10-CM | POA: Diagnosis not present

## 2021-09-15 DIAGNOSIS — N323 Diverticulum of bladder: Secondary | ICD-10-CM | POA: Diagnosis not present

## 2021-09-15 DIAGNOSIS — I5030 Unspecified diastolic (congestive) heart failure: Secondary | ICD-10-CM | POA: Diagnosis not present

## 2021-09-15 DIAGNOSIS — I2510 Atherosclerotic heart disease of native coronary artery without angina pectoris: Secondary | ICD-10-CM | POA: Diagnosis not present

## 2021-09-15 DIAGNOSIS — I69314 Frontal lobe and executive function deficit following cerebral infarction: Secondary | ICD-10-CM | POA: Diagnosis not present

## 2021-09-15 DIAGNOSIS — R911 Solitary pulmonary nodule: Secondary | ICD-10-CM | POA: Diagnosis not present

## 2021-09-15 DIAGNOSIS — I517 Cardiomegaly: Secondary | ICD-10-CM | POA: Diagnosis not present

## 2021-09-15 DIAGNOSIS — I421 Obstructive hypertrophic cardiomyopathy: Secondary | ICD-10-CM | POA: Diagnosis not present

## 2021-09-15 DIAGNOSIS — E119 Type 2 diabetes mellitus without complications: Secondary | ICD-10-CM | POA: Diagnosis not present

## 2021-09-15 DIAGNOSIS — J90 Pleural effusion, not elsewhere classified: Secondary | ICD-10-CM | POA: Diagnosis not present

## 2021-09-15 DIAGNOSIS — I480 Paroxysmal atrial fibrillation: Secondary | ICD-10-CM | POA: Diagnosis not present

## 2021-09-15 DIAGNOSIS — I110 Hypertensive heart disease with heart failure: Secondary | ICD-10-CM | POA: Diagnosis not present

## 2021-09-15 DIAGNOSIS — Z950 Presence of cardiac pacemaker: Secondary | ICD-10-CM | POA: Diagnosis not present

## 2021-09-15 DIAGNOSIS — I445 Left posterior fascicular block: Secondary | ICD-10-CM | POA: Diagnosis not present

## 2021-09-15 DIAGNOSIS — Z7984 Long term (current) use of oral hypoglycemic drugs: Secondary | ICD-10-CM | POA: Diagnosis not present

## 2021-09-16 DIAGNOSIS — E875 Hyperkalemia: Secondary | ICD-10-CM | POA: Diagnosis not present

## 2021-09-16 DIAGNOSIS — Z7409 Other reduced mobility: Secondary | ICD-10-CM | POA: Diagnosis not present

## 2021-09-16 DIAGNOSIS — N179 Acute kidney failure, unspecified: Secondary | ICD-10-CM | POA: Diagnosis not present

## 2021-09-16 DIAGNOSIS — J90 Pleural effusion, not elsewhere classified: Secondary | ICD-10-CM | POA: Diagnosis not present

## 2021-09-16 DIAGNOSIS — N390 Urinary tract infection, site not specified: Secondary | ICD-10-CM | POA: Diagnosis not present

## 2021-09-16 DIAGNOSIS — R269 Unspecified abnormalities of gait and mobility: Secondary | ICD-10-CM | POA: Diagnosis not present

## 2021-09-16 DIAGNOSIS — R609 Edema, unspecified: Secondary | ICD-10-CM | POA: Diagnosis not present

## 2021-09-16 DIAGNOSIS — Z8673 Personal history of transient ischemic attack (TIA), and cerebral infarction without residual deficits: Secondary | ICD-10-CM | POA: Diagnosis not present

## 2021-09-16 DIAGNOSIS — R9431 Abnormal electrocardiogram [ECG] [EKG]: Secondary | ICD-10-CM | POA: Diagnosis not present

## 2021-09-17 DIAGNOSIS — I272 Pulmonary hypertension, unspecified: Secondary | ICD-10-CM | POA: Diagnosis not present

## 2021-09-17 DIAGNOSIS — N179 Acute kidney failure, unspecified: Secondary | ICD-10-CM | POA: Diagnosis not present

## 2021-09-17 DIAGNOSIS — I4891 Unspecified atrial fibrillation: Secondary | ICD-10-CM | POA: Diagnosis not present

## 2021-09-17 DIAGNOSIS — R2243 Localized swelling, mass and lump, lower limb, bilateral: Secondary | ICD-10-CM | POA: Diagnosis not present

## 2021-09-17 DIAGNOSIS — I081 Rheumatic disorders of both mitral and tricuspid valves: Secondary | ICD-10-CM | POA: Diagnosis not present

## 2021-09-17 DIAGNOSIS — N12 Tubulo-interstitial nephritis, not specified as acute or chronic: Secondary | ICD-10-CM | POA: Diagnosis not present

## 2021-09-18 DIAGNOSIS — I4891 Unspecified atrial fibrillation: Secondary | ICD-10-CM | POA: Diagnosis not present

## 2021-09-18 DIAGNOSIS — N179 Acute kidney failure, unspecified: Secondary | ICD-10-CM | POA: Diagnosis not present

## 2021-09-18 DIAGNOSIS — N12 Tubulo-interstitial nephritis, not specified as acute or chronic: Secondary | ICD-10-CM | POA: Diagnosis not present

## 2021-09-19 DIAGNOSIS — N179 Acute kidney failure, unspecified: Secondary | ICD-10-CM | POA: Diagnosis not present

## 2021-09-19 DIAGNOSIS — N12 Tubulo-interstitial nephritis, not specified as acute or chronic: Secondary | ICD-10-CM | POA: Diagnosis not present

## 2021-09-19 DIAGNOSIS — I4891 Unspecified atrial fibrillation: Secondary | ICD-10-CM | POA: Diagnosis not present

## 2021-09-20 DIAGNOSIS — N12 Tubulo-interstitial nephritis, not specified as acute or chronic: Secondary | ICD-10-CM | POA: Diagnosis not present

## 2021-09-20 DIAGNOSIS — N179 Acute kidney failure, unspecified: Secondary | ICD-10-CM | POA: Diagnosis not present

## 2021-09-20 DIAGNOSIS — I4891 Unspecified atrial fibrillation: Secondary | ICD-10-CM | POA: Diagnosis not present

## 2021-09-21 DIAGNOSIS — N179 Acute kidney failure, unspecified: Secondary | ICD-10-CM | POA: Diagnosis not present

## 2021-09-21 DIAGNOSIS — I639 Cerebral infarction, unspecified: Secondary | ICD-10-CM | POA: Diagnosis not present

## 2021-09-21 DIAGNOSIS — I10 Essential (primary) hypertension: Secondary | ICD-10-CM | POA: Diagnosis not present

## 2021-09-21 DIAGNOSIS — K219 Gastro-esophageal reflux disease without esophagitis: Secondary | ICD-10-CM | POA: Diagnosis not present

## 2021-09-21 DIAGNOSIS — N12 Tubulo-interstitial nephritis, not specified as acute or chronic: Secondary | ICD-10-CM | POA: Diagnosis not present

## 2021-09-21 DIAGNOSIS — I4891 Unspecified atrial fibrillation: Secondary | ICD-10-CM | POA: Diagnosis not present

## 2021-09-21 DIAGNOSIS — E785 Hyperlipidemia, unspecified: Secondary | ICD-10-CM | POA: Diagnosis not present

## 2021-09-21 DIAGNOSIS — R531 Weakness: Secondary | ICD-10-CM | POA: Diagnosis not present

## 2021-09-21 DIAGNOSIS — J90 Pleural effusion, not elsewhere classified: Secondary | ICD-10-CM | POA: Diagnosis not present

## 2021-09-21 DIAGNOSIS — R5381 Other malaise: Secondary | ICD-10-CM | POA: Diagnosis not present

## 2021-09-21 DIAGNOSIS — R278 Other lack of coordination: Secondary | ICD-10-CM | POA: Diagnosis not present

## 2021-09-21 DIAGNOSIS — I679 Cerebrovascular disease, unspecified: Secondary | ICD-10-CM | POA: Diagnosis not present

## 2021-09-21 DIAGNOSIS — N136 Pyonephrosis: Secondary | ICD-10-CM | POA: Diagnosis not present

## 2021-09-21 DIAGNOSIS — Z9981 Dependence on supplemental oxygen: Secondary | ICD-10-CM | POA: Diagnosis not present

## 2021-09-21 DIAGNOSIS — R262 Difficulty in walking, not elsewhere classified: Secondary | ICD-10-CM | POA: Diagnosis not present

## 2021-09-21 DIAGNOSIS — Z8673 Personal history of transient ischemic attack (TIA), and cerebral infarction without residual deficits: Secondary | ICD-10-CM | POA: Diagnosis not present

## 2021-09-21 DIAGNOSIS — Z9181 History of falling: Secondary | ICD-10-CM | POA: Diagnosis not present

## 2021-09-21 DIAGNOSIS — E639 Nutritional deficiency, unspecified: Secondary | ICD-10-CM | POA: Diagnosis not present

## 2021-09-21 DIAGNOSIS — E119 Type 2 diabetes mellitus without complications: Secondary | ICD-10-CM | POA: Diagnosis not present

## 2021-09-21 DIAGNOSIS — I509 Heart failure, unspecified: Secondary | ICD-10-CM | POA: Diagnosis not present

## 2021-09-21 DIAGNOSIS — Z950 Presence of cardiac pacemaker: Secondary | ICD-10-CM | POA: Diagnosis not present

## 2021-09-21 DIAGNOSIS — R54 Age-related physical debility: Secondary | ICD-10-CM | POA: Diagnosis not present

## 2021-09-21 DIAGNOSIS — M6259 Muscle wasting and atrophy, not elsewhere classified, multiple sites: Secondary | ICD-10-CM | POA: Diagnosis not present

## 2021-09-21 DIAGNOSIS — N10 Acute tubulo-interstitial nephritis: Secondary | ICD-10-CM | POA: Diagnosis not present

## 2021-09-21 DIAGNOSIS — I482 Chronic atrial fibrillation, unspecified: Secondary | ICD-10-CM | POA: Diagnosis not present

## 2021-09-21 DIAGNOSIS — N390 Urinary tract infection, site not specified: Secondary | ICD-10-CM | POA: Diagnosis not present

## 2021-09-21 DIAGNOSIS — M6281 Muscle weakness (generalized): Secondary | ICD-10-CM | POA: Diagnosis not present

## 2021-09-22 DIAGNOSIS — N136 Pyonephrosis: Secondary | ICD-10-CM | POA: Diagnosis not present

## 2021-09-22 DIAGNOSIS — Z9981 Dependence on supplemental oxygen: Secondary | ICD-10-CM | POA: Diagnosis not present

## 2021-09-22 DIAGNOSIS — R5381 Other malaise: Secondary | ICD-10-CM | POA: Diagnosis not present

## 2021-09-22 DIAGNOSIS — K219 Gastro-esophageal reflux disease without esophagitis: Secondary | ICD-10-CM | POA: Diagnosis not present

## 2021-09-22 DIAGNOSIS — R54 Age-related physical debility: Secondary | ICD-10-CM | POA: Diagnosis not present

## 2021-09-22 DIAGNOSIS — I509 Heart failure, unspecified: Secondary | ICD-10-CM | POA: Diagnosis not present

## 2021-09-22 DIAGNOSIS — E119 Type 2 diabetes mellitus without complications: Secondary | ICD-10-CM | POA: Diagnosis not present

## 2021-09-22 DIAGNOSIS — N390 Urinary tract infection, site not specified: Secondary | ICD-10-CM | POA: Diagnosis not present

## 2021-09-22 DIAGNOSIS — J90 Pleural effusion, not elsewhere classified: Secondary | ICD-10-CM | POA: Diagnosis not present

## 2021-09-22 DIAGNOSIS — M6281 Muscle weakness (generalized): Secondary | ICD-10-CM | POA: Diagnosis not present

## 2021-09-22 DIAGNOSIS — Z950 Presence of cardiac pacemaker: Secondary | ICD-10-CM | POA: Diagnosis not present

## 2021-09-22 DIAGNOSIS — I10 Essential (primary) hypertension: Secondary | ICD-10-CM | POA: Diagnosis not present

## 2021-09-22 DIAGNOSIS — I482 Chronic atrial fibrillation, unspecified: Secondary | ICD-10-CM | POA: Diagnosis not present

## 2021-09-22 DIAGNOSIS — N179 Acute kidney failure, unspecified: Secondary | ICD-10-CM | POA: Diagnosis not present

## 2021-09-22 DIAGNOSIS — I679 Cerebrovascular disease, unspecified: Secondary | ICD-10-CM | POA: Diagnosis not present

## 2021-09-22 DIAGNOSIS — Z8673 Personal history of transient ischemic attack (TIA), and cerebral infarction without residual deficits: Secondary | ICD-10-CM | POA: Diagnosis not present

## 2021-09-22 DIAGNOSIS — E785 Hyperlipidemia, unspecified: Secondary | ICD-10-CM | POA: Diagnosis not present

## 2021-09-22 DIAGNOSIS — I4891 Unspecified atrial fibrillation: Secondary | ICD-10-CM | POA: Diagnosis not present

## 2021-09-24 DIAGNOSIS — Z8673 Personal history of transient ischemic attack (TIA), and cerebral infarction without residual deficits: Secondary | ICD-10-CM | POA: Diagnosis not present

## 2021-09-24 DIAGNOSIS — K219 Gastro-esophageal reflux disease without esophagitis: Secondary | ICD-10-CM | POA: Diagnosis not present

## 2021-09-24 DIAGNOSIS — E785 Hyperlipidemia, unspecified: Secondary | ICD-10-CM | POA: Diagnosis not present

## 2021-09-24 DIAGNOSIS — I509 Heart failure, unspecified: Secondary | ICD-10-CM | POA: Diagnosis not present

## 2021-09-24 DIAGNOSIS — I482 Chronic atrial fibrillation, unspecified: Secondary | ICD-10-CM | POA: Diagnosis not present

## 2021-09-24 DIAGNOSIS — J90 Pleural effusion, not elsewhere classified: Secondary | ICD-10-CM | POA: Diagnosis not present

## 2021-09-24 DIAGNOSIS — I679 Cerebrovascular disease, unspecified: Secondary | ICD-10-CM | POA: Diagnosis not present

## 2021-09-24 DIAGNOSIS — E119 Type 2 diabetes mellitus without complications: Secondary | ICD-10-CM | POA: Diagnosis not present

## 2021-09-24 DIAGNOSIS — I4891 Unspecified atrial fibrillation: Secondary | ICD-10-CM | POA: Diagnosis not present

## 2021-09-24 DIAGNOSIS — Z9981 Dependence on supplemental oxygen: Secondary | ICD-10-CM | POA: Diagnosis not present

## 2021-09-24 DIAGNOSIS — R5381 Other malaise: Secondary | ICD-10-CM | POA: Diagnosis not present

## 2021-09-24 DIAGNOSIS — Z950 Presence of cardiac pacemaker: Secondary | ICD-10-CM | POA: Diagnosis not present

## 2021-09-24 DIAGNOSIS — I10 Essential (primary) hypertension: Secondary | ICD-10-CM | POA: Diagnosis not present

## 2021-09-24 DIAGNOSIS — N390 Urinary tract infection, site not specified: Secondary | ICD-10-CM | POA: Diagnosis not present

## 2021-09-24 DIAGNOSIS — N136 Pyonephrosis: Secondary | ICD-10-CM | POA: Diagnosis not present

## 2021-09-24 DIAGNOSIS — M6281 Muscle weakness (generalized): Secondary | ICD-10-CM | POA: Diagnosis not present

## 2021-09-24 DIAGNOSIS — R54 Age-related physical debility: Secondary | ICD-10-CM | POA: Diagnosis not present

## 2021-09-24 DIAGNOSIS — N179 Acute kidney failure, unspecified: Secondary | ICD-10-CM | POA: Diagnosis not present

## 2021-09-27 DIAGNOSIS — Z9981 Dependence on supplemental oxygen: Secondary | ICD-10-CM | POA: Diagnosis not present

## 2021-09-27 DIAGNOSIS — E119 Type 2 diabetes mellitus without complications: Secondary | ICD-10-CM | POA: Diagnosis not present

## 2021-09-27 DIAGNOSIS — I509 Heart failure, unspecified: Secondary | ICD-10-CM | POA: Diagnosis not present

## 2021-09-27 DIAGNOSIS — E785 Hyperlipidemia, unspecified: Secondary | ICD-10-CM | POA: Diagnosis not present

## 2021-09-27 DIAGNOSIS — K219 Gastro-esophageal reflux disease without esophagitis: Secondary | ICD-10-CM | POA: Diagnosis not present

## 2021-09-27 DIAGNOSIS — I679 Cerebrovascular disease, unspecified: Secondary | ICD-10-CM | POA: Diagnosis not present

## 2021-09-27 DIAGNOSIS — I482 Chronic atrial fibrillation, unspecified: Secondary | ICD-10-CM | POA: Diagnosis not present

## 2021-09-27 DIAGNOSIS — I10 Essential (primary) hypertension: Secondary | ICD-10-CM | POA: Diagnosis not present

## 2021-09-27 DIAGNOSIS — J90 Pleural effusion, not elsewhere classified: Secondary | ICD-10-CM | POA: Diagnosis not present

## 2021-09-27 DIAGNOSIS — R54 Age-related physical debility: Secondary | ICD-10-CM | POA: Diagnosis not present

## 2021-09-27 DIAGNOSIS — E639 Nutritional deficiency, unspecified: Secondary | ICD-10-CM | POA: Diagnosis not present

## 2021-09-27 DIAGNOSIS — Z950 Presence of cardiac pacemaker: Secondary | ICD-10-CM | POA: Diagnosis not present

## 2021-09-28 DIAGNOSIS — N136 Pyonephrosis: Secondary | ICD-10-CM | POA: Diagnosis not present

## 2021-09-28 DIAGNOSIS — N179 Acute kidney failure, unspecified: Secondary | ICD-10-CM | POA: Diagnosis not present

## 2021-09-28 DIAGNOSIS — N390 Urinary tract infection, site not specified: Secondary | ICD-10-CM | POA: Diagnosis not present

## 2021-09-28 DIAGNOSIS — E119 Type 2 diabetes mellitus without complications: Secondary | ICD-10-CM | POA: Diagnosis not present

## 2021-09-28 DIAGNOSIS — I4891 Unspecified atrial fibrillation: Secondary | ICD-10-CM | POA: Diagnosis not present

## 2021-09-28 DIAGNOSIS — M6281 Muscle weakness (generalized): Secondary | ICD-10-CM | POA: Diagnosis not present

## 2021-09-28 DIAGNOSIS — K219 Gastro-esophageal reflux disease without esophagitis: Secondary | ICD-10-CM | POA: Diagnosis not present

## 2021-09-28 DIAGNOSIS — R5381 Other malaise: Secondary | ICD-10-CM | POA: Diagnosis not present

## 2021-09-28 DIAGNOSIS — Z8673 Personal history of transient ischemic attack (TIA), and cerebral infarction without residual deficits: Secondary | ICD-10-CM | POA: Diagnosis not present

## 2021-09-30 DIAGNOSIS — E119 Type 2 diabetes mellitus without complications: Secondary | ICD-10-CM | POA: Diagnosis not present

## 2021-09-30 DIAGNOSIS — J90 Pleural effusion, not elsewhere classified: Secondary | ICD-10-CM | POA: Diagnosis not present

## 2021-09-30 DIAGNOSIS — Z8673 Personal history of transient ischemic attack (TIA), and cerebral infarction without residual deficits: Secondary | ICD-10-CM | POA: Diagnosis not present

## 2021-09-30 DIAGNOSIS — I10 Essential (primary) hypertension: Secondary | ICD-10-CM | POA: Diagnosis not present

## 2021-09-30 DIAGNOSIS — I482 Chronic atrial fibrillation, unspecified: Secondary | ICD-10-CM | POA: Diagnosis not present

## 2021-09-30 DIAGNOSIS — R54 Age-related physical debility: Secondary | ICD-10-CM | POA: Diagnosis not present

## 2021-09-30 DIAGNOSIS — N390 Urinary tract infection, site not specified: Secondary | ICD-10-CM | POA: Diagnosis not present

## 2021-09-30 DIAGNOSIS — K219 Gastro-esophageal reflux disease without esophagitis: Secondary | ICD-10-CM | POA: Diagnosis not present

## 2021-09-30 DIAGNOSIS — I4891 Unspecified atrial fibrillation: Secondary | ICD-10-CM | POA: Diagnosis not present

## 2021-09-30 DIAGNOSIS — M6281 Muscle weakness (generalized): Secondary | ICD-10-CM | POA: Diagnosis not present

## 2021-09-30 DIAGNOSIS — N136 Pyonephrosis: Secondary | ICD-10-CM | POA: Diagnosis not present

## 2021-09-30 DIAGNOSIS — R5381 Other malaise: Secondary | ICD-10-CM | POA: Diagnosis not present

## 2021-09-30 DIAGNOSIS — N179 Acute kidney failure, unspecified: Secondary | ICD-10-CM | POA: Diagnosis not present

## 2021-09-30 DIAGNOSIS — I679 Cerebrovascular disease, unspecified: Secondary | ICD-10-CM | POA: Diagnosis not present

## 2021-09-30 DIAGNOSIS — I509 Heart failure, unspecified: Secondary | ICD-10-CM | POA: Diagnosis not present

## 2021-09-30 DIAGNOSIS — E785 Hyperlipidemia, unspecified: Secondary | ICD-10-CM | POA: Diagnosis not present

## 2021-10-01 DIAGNOSIS — I679 Cerebrovascular disease, unspecified: Secondary | ICD-10-CM | POA: Diagnosis not present

## 2021-10-01 DIAGNOSIS — K219 Gastro-esophageal reflux disease without esophagitis: Secondary | ICD-10-CM | POA: Diagnosis not present

## 2021-10-01 DIAGNOSIS — I10 Essential (primary) hypertension: Secondary | ICD-10-CM | POA: Diagnosis not present

## 2021-10-01 DIAGNOSIS — E785 Hyperlipidemia, unspecified: Secondary | ICD-10-CM | POA: Diagnosis not present

## 2021-10-01 DIAGNOSIS — E119 Type 2 diabetes mellitus without complications: Secondary | ICD-10-CM | POA: Diagnosis not present

## 2021-10-01 DIAGNOSIS — R54 Age-related physical debility: Secondary | ICD-10-CM | POA: Diagnosis not present

## 2021-10-01 DIAGNOSIS — I482 Chronic atrial fibrillation, unspecified: Secondary | ICD-10-CM | POA: Diagnosis not present

## 2021-10-01 DIAGNOSIS — J90 Pleural effusion, not elsewhere classified: Secondary | ICD-10-CM | POA: Diagnosis not present

## 2021-10-01 DIAGNOSIS — E639 Nutritional deficiency, unspecified: Secondary | ICD-10-CM | POA: Diagnosis not present

## 2021-10-01 DIAGNOSIS — I509 Heart failure, unspecified: Secondary | ICD-10-CM | POA: Diagnosis not present

## 2021-10-03 DIAGNOSIS — Z950 Presence of cardiac pacemaker: Secondary | ICD-10-CM | POA: Diagnosis not present

## 2021-10-05 DIAGNOSIS — E119 Type 2 diabetes mellitus without complications: Secondary | ICD-10-CM | POA: Diagnosis not present

## 2021-10-05 DIAGNOSIS — N390 Urinary tract infection, site not specified: Secondary | ICD-10-CM | POA: Diagnosis not present

## 2021-10-05 DIAGNOSIS — I482 Chronic atrial fibrillation, unspecified: Secondary | ICD-10-CM | POA: Diagnosis not present

## 2021-10-05 DIAGNOSIS — J90 Pleural effusion, not elsewhere classified: Secondary | ICD-10-CM | POA: Diagnosis not present

## 2021-10-05 DIAGNOSIS — I679 Cerebrovascular disease, unspecified: Secondary | ICD-10-CM | POA: Diagnosis not present

## 2021-10-05 DIAGNOSIS — M6281 Muscle weakness (generalized): Secondary | ICD-10-CM | POA: Diagnosis not present

## 2021-10-05 DIAGNOSIS — E639 Nutritional deficiency, unspecified: Secondary | ICD-10-CM | POA: Diagnosis not present

## 2021-10-05 DIAGNOSIS — R5381 Other malaise: Secondary | ICD-10-CM | POA: Diagnosis not present

## 2021-10-05 DIAGNOSIS — E785 Hyperlipidemia, unspecified: Secondary | ICD-10-CM | POA: Diagnosis not present

## 2021-10-05 DIAGNOSIS — I4891 Unspecified atrial fibrillation: Secondary | ICD-10-CM | POA: Diagnosis not present

## 2021-10-05 DIAGNOSIS — K219 Gastro-esophageal reflux disease without esophagitis: Secondary | ICD-10-CM | POA: Diagnosis not present

## 2021-10-05 DIAGNOSIS — N179 Acute kidney failure, unspecified: Secondary | ICD-10-CM | POA: Diagnosis not present

## 2021-10-05 DIAGNOSIS — Z8673 Personal history of transient ischemic attack (TIA), and cerebral infarction without residual deficits: Secondary | ICD-10-CM | POA: Diagnosis not present

## 2021-10-05 DIAGNOSIS — N136 Pyonephrosis: Secondary | ICD-10-CM | POA: Diagnosis not present

## 2021-10-05 DIAGNOSIS — R54 Age-related physical debility: Secondary | ICD-10-CM | POA: Diagnosis not present

## 2021-10-05 DIAGNOSIS — I10 Essential (primary) hypertension: Secondary | ICD-10-CM | POA: Diagnosis not present

## 2021-10-05 DIAGNOSIS — I509 Heart failure, unspecified: Secondary | ICD-10-CM | POA: Diagnosis not present

## 2021-10-07 DIAGNOSIS — N136 Pyonephrosis: Secondary | ICD-10-CM | POA: Diagnosis not present

## 2021-10-07 DIAGNOSIS — K219 Gastro-esophageal reflux disease without esophagitis: Secondary | ICD-10-CM | POA: Diagnosis not present

## 2021-10-07 DIAGNOSIS — M6281 Muscle weakness (generalized): Secondary | ICD-10-CM | POA: Diagnosis not present

## 2021-10-07 DIAGNOSIS — R5381 Other malaise: Secondary | ICD-10-CM | POA: Diagnosis not present

## 2021-10-07 DIAGNOSIS — I4891 Unspecified atrial fibrillation: Secondary | ICD-10-CM | POA: Diagnosis not present

## 2021-10-07 DIAGNOSIS — E119 Type 2 diabetes mellitus without complications: Secondary | ICD-10-CM | POA: Diagnosis not present

## 2021-10-07 DIAGNOSIS — N179 Acute kidney failure, unspecified: Secondary | ICD-10-CM | POA: Diagnosis not present

## 2021-10-07 DIAGNOSIS — Z8673 Personal history of transient ischemic attack (TIA), and cerebral infarction without residual deficits: Secondary | ICD-10-CM | POA: Diagnosis not present

## 2021-10-07 DIAGNOSIS — N390 Urinary tract infection, site not specified: Secondary | ICD-10-CM | POA: Diagnosis not present

## 2021-10-14 DIAGNOSIS — M6259 Muscle wasting and atrophy, not elsewhere classified, multiple sites: Secondary | ICD-10-CM | POA: Diagnosis not present

## 2021-10-14 DIAGNOSIS — R262 Difficulty in walking, not elsewhere classified: Secondary | ICD-10-CM | POA: Diagnosis not present

## 2021-10-14 DIAGNOSIS — N10 Acute tubulo-interstitial nephritis: Secondary | ICD-10-CM | POA: Diagnosis not present

## 2021-10-16 DIAGNOSIS — R262 Difficulty in walking, not elsewhere classified: Secondary | ICD-10-CM | POA: Diagnosis not present

## 2021-10-16 DIAGNOSIS — M6259 Muscle wasting and atrophy, not elsewhere classified, multiple sites: Secondary | ICD-10-CM | POA: Diagnosis not present

## 2021-10-16 DIAGNOSIS — N10 Acute tubulo-interstitial nephritis: Secondary | ICD-10-CM | POA: Diagnosis not present

## 2021-10-18 DIAGNOSIS — I517 Cardiomegaly: Secondary | ICD-10-CM | POA: Diagnosis not present

## 2021-10-18 DIAGNOSIS — N390 Urinary tract infection, site not specified: Secondary | ICD-10-CM | POA: Diagnosis not present

## 2021-10-18 DIAGNOSIS — N136 Pyonephrosis: Secondary | ICD-10-CM | POA: Diagnosis not present

## 2021-10-18 DIAGNOSIS — J90 Pleural effusion, not elsewhere classified: Secondary | ICD-10-CM | POA: Diagnosis not present

## 2021-10-18 DIAGNOSIS — Z8673 Personal history of transient ischemic attack (TIA), and cerebral infarction without residual deficits: Secondary | ICD-10-CM | POA: Diagnosis not present

## 2021-10-18 DIAGNOSIS — N179 Acute kidney failure, unspecified: Secondary | ICD-10-CM | POA: Diagnosis not present

## 2021-10-18 DIAGNOSIS — E119 Type 2 diabetes mellitus without complications: Secondary | ICD-10-CM | POA: Diagnosis not present

## 2021-10-18 DIAGNOSIS — I639 Cerebral infarction, unspecified: Secondary | ICD-10-CM | POA: Diagnosis not present

## 2021-10-18 DIAGNOSIS — M6281 Muscle weakness (generalized): Secondary | ICD-10-CM | POA: Diagnosis not present

## 2021-10-18 DIAGNOSIS — K219 Gastro-esophageal reflux disease without esophagitis: Secondary | ICD-10-CM | POA: Diagnosis not present

## 2021-10-18 DIAGNOSIS — Z950 Presence of cardiac pacemaker: Secondary | ICD-10-CM | POA: Diagnosis not present

## 2021-10-18 DIAGNOSIS — R5381 Other malaise: Secondary | ICD-10-CM | POA: Diagnosis not present

## 2021-10-18 DIAGNOSIS — Z9981 Dependence on supplemental oxygen: Secondary | ICD-10-CM | POA: Diagnosis not present

## 2021-10-18 DIAGNOSIS — I482 Chronic atrial fibrillation, unspecified: Secondary | ICD-10-CM | POA: Diagnosis not present

## 2021-10-18 DIAGNOSIS — R0602 Shortness of breath: Secondary | ICD-10-CM | POA: Diagnosis not present

## 2021-10-18 DIAGNOSIS — I4891 Unspecified atrial fibrillation: Secondary | ICD-10-CM | POA: Diagnosis not present

## 2021-10-18 DIAGNOSIS — I509 Heart failure, unspecified: Secondary | ICD-10-CM | POA: Diagnosis not present

## 2021-10-18 DIAGNOSIS — R0989 Other specified symptoms and signs involving the circulatory and respiratory systems: Secondary | ICD-10-CM | POA: Diagnosis not present

## 2021-10-19 DIAGNOSIS — N10 Acute tubulo-interstitial nephritis: Secondary | ICD-10-CM | POA: Diagnosis not present

## 2021-10-19 DIAGNOSIS — R262 Difficulty in walking, not elsewhere classified: Secondary | ICD-10-CM | POA: Diagnosis not present

## 2021-10-19 DIAGNOSIS — M6259 Muscle wasting and atrophy, not elsewhere classified, multiple sites: Secondary | ICD-10-CM | POA: Diagnosis not present

## 2021-10-20 DIAGNOSIS — R262 Difficulty in walking, not elsewhere classified: Secondary | ICD-10-CM | POA: Diagnosis not present

## 2021-10-20 DIAGNOSIS — N10 Acute tubulo-interstitial nephritis: Secondary | ICD-10-CM | POA: Diagnosis not present

## 2021-10-20 DIAGNOSIS — M6259 Muscle wasting and atrophy, not elsewhere classified, multiple sites: Secondary | ICD-10-CM | POA: Diagnosis not present

## 2021-10-21 DIAGNOSIS — I482 Chronic atrial fibrillation, unspecified: Secondary | ICD-10-CM | POA: Diagnosis not present

## 2021-10-21 DIAGNOSIS — J90 Pleural effusion, not elsewhere classified: Secondary | ICD-10-CM | POA: Diagnosis not present

## 2021-10-21 DIAGNOSIS — E8770 Fluid overload, unspecified: Secondary | ICD-10-CM | POA: Diagnosis not present

## 2021-10-21 DIAGNOSIS — I10 Essential (primary) hypertension: Secondary | ICD-10-CM | POA: Diagnosis not present

## 2021-10-21 DIAGNOSIS — E119 Type 2 diabetes mellitus without complications: Secondary | ICD-10-CM | POA: Diagnosis not present

## 2021-10-21 DIAGNOSIS — J9601 Acute respiratory failure with hypoxia: Secondary | ICD-10-CM | POA: Diagnosis not present

## 2021-10-22 DIAGNOSIS — J9601 Acute respiratory failure with hypoxia: Secondary | ICD-10-CM | POA: Diagnosis not present

## 2021-10-25 DIAGNOSIS — N10 Acute tubulo-interstitial nephritis: Secondary | ICD-10-CM | POA: Diagnosis not present

## 2021-10-25 DIAGNOSIS — R262 Difficulty in walking, not elsewhere classified: Secondary | ICD-10-CM | POA: Diagnosis not present

## 2021-10-25 DIAGNOSIS — M6259 Muscle wasting and atrophy, not elsewhere classified, multiple sites: Secondary | ICD-10-CM | POA: Diagnosis not present

## 2021-10-25 DIAGNOSIS — R1312 Dysphagia, oropharyngeal phase: Secondary | ICD-10-CM | POA: Diagnosis not present

## 2021-10-26 DIAGNOSIS — M6259 Muscle wasting and atrophy, not elsewhere classified, multiple sites: Secondary | ICD-10-CM | POA: Diagnosis not present

## 2021-10-26 DIAGNOSIS — R262 Difficulty in walking, not elsewhere classified: Secondary | ICD-10-CM | POA: Diagnosis not present

## 2021-10-26 DIAGNOSIS — N10 Acute tubulo-interstitial nephritis: Secondary | ICD-10-CM | POA: Diagnosis not present

## 2021-10-26 DIAGNOSIS — R1312 Dysphagia, oropharyngeal phase: Secondary | ICD-10-CM | POA: Diagnosis not present

## 2021-10-27 DIAGNOSIS — E8770 Fluid overload, unspecified: Secondary | ICD-10-CM | POA: Diagnosis not present

## 2021-10-27 DIAGNOSIS — L24A9 Irritant contact dermatitis due friction or contact with other specified body fluids: Secondary | ICD-10-CM | POA: Diagnosis not present

## 2021-10-27 DIAGNOSIS — G4700 Insomnia, unspecified: Secondary | ICD-10-CM | POA: Diagnosis not present

## 2021-10-27 DIAGNOSIS — E639 Nutritional deficiency, unspecified: Secondary | ICD-10-CM | POA: Diagnosis not present

## 2021-10-28 DIAGNOSIS — R262 Difficulty in walking, not elsewhere classified: Secondary | ICD-10-CM | POA: Diagnosis not present

## 2021-10-28 DIAGNOSIS — N179 Acute kidney failure, unspecified: Secondary | ICD-10-CM | POA: Diagnosis not present

## 2021-10-28 DIAGNOSIS — I482 Chronic atrial fibrillation, unspecified: Secondary | ICD-10-CM | POA: Diagnosis not present

## 2021-10-28 DIAGNOSIS — J90 Pleural effusion, not elsewhere classified: Secondary | ICD-10-CM | POA: Diagnosis not present

## 2021-10-28 DIAGNOSIS — J9601 Acute respiratory failure with hypoxia: Secondary | ICD-10-CM | POA: Diagnosis not present

## 2021-10-28 DIAGNOSIS — R5381 Other malaise: Secondary | ICD-10-CM | POA: Diagnosis not present

## 2021-10-28 DIAGNOSIS — N10 Acute tubulo-interstitial nephritis: Secondary | ICD-10-CM | POA: Diagnosis not present

## 2021-10-28 DIAGNOSIS — R1312 Dysphagia, oropharyngeal phase: Secondary | ICD-10-CM | POA: Diagnosis not present

## 2021-10-28 DIAGNOSIS — K219 Gastro-esophageal reflux disease without esophagitis: Secondary | ICD-10-CM | POA: Diagnosis not present

## 2021-10-28 DIAGNOSIS — I4891 Unspecified atrial fibrillation: Secondary | ICD-10-CM | POA: Diagnosis not present

## 2021-10-28 DIAGNOSIS — N390 Urinary tract infection, site not specified: Secondary | ICD-10-CM | POA: Diagnosis not present

## 2021-10-28 DIAGNOSIS — I509 Heart failure, unspecified: Secondary | ICD-10-CM | POA: Diagnosis not present

## 2021-10-28 DIAGNOSIS — E119 Type 2 diabetes mellitus without complications: Secondary | ICD-10-CM | POA: Diagnosis not present

## 2021-10-28 DIAGNOSIS — N136 Pyonephrosis: Secondary | ICD-10-CM | POA: Diagnosis not present

## 2021-10-28 DIAGNOSIS — I10 Essential (primary) hypertension: Secondary | ICD-10-CM | POA: Diagnosis not present

## 2021-10-28 DIAGNOSIS — M6259 Muscle wasting and atrophy, not elsewhere classified, multiple sites: Secondary | ICD-10-CM | POA: Diagnosis not present

## 2021-10-28 DIAGNOSIS — M6281 Muscle weakness (generalized): Secondary | ICD-10-CM | POA: Diagnosis not present

## 2021-10-28 DIAGNOSIS — E8770 Fluid overload, unspecified: Secondary | ICD-10-CM | POA: Diagnosis not present

## 2021-10-28 DIAGNOSIS — Z8673 Personal history of transient ischemic attack (TIA), and cerebral infarction without residual deficits: Secondary | ICD-10-CM | POA: Diagnosis not present

## 2021-10-29 DIAGNOSIS — R262 Difficulty in walking, not elsewhere classified: Secondary | ICD-10-CM | POA: Diagnosis not present

## 2021-10-29 DIAGNOSIS — R1312 Dysphagia, oropharyngeal phase: Secondary | ICD-10-CM | POA: Diagnosis not present

## 2021-10-29 DIAGNOSIS — M6259 Muscle wasting and atrophy, not elsewhere classified, multiple sites: Secondary | ICD-10-CM | POA: Diagnosis not present

## 2021-10-29 DIAGNOSIS — N10 Acute tubulo-interstitial nephritis: Secondary | ICD-10-CM | POA: Diagnosis not present

## 2021-11-01 DIAGNOSIS — M6259 Muscle wasting and atrophy, not elsewhere classified, multiple sites: Secondary | ICD-10-CM | POA: Diagnosis not present

## 2021-11-01 DIAGNOSIS — N10 Acute tubulo-interstitial nephritis: Secondary | ICD-10-CM | POA: Diagnosis not present

## 2021-11-01 DIAGNOSIS — R1312 Dysphagia, oropharyngeal phase: Secondary | ICD-10-CM | POA: Diagnosis not present

## 2021-11-01 DIAGNOSIS — R262 Difficulty in walking, not elsewhere classified: Secondary | ICD-10-CM | POA: Diagnosis not present

## 2021-11-02 DIAGNOSIS — M6259 Muscle wasting and atrophy, not elsewhere classified, multiple sites: Secondary | ICD-10-CM | POA: Diagnosis not present

## 2021-11-02 DIAGNOSIS — N10 Acute tubulo-interstitial nephritis: Secondary | ICD-10-CM | POA: Diagnosis not present

## 2021-11-02 DIAGNOSIS — R262 Difficulty in walking, not elsewhere classified: Secondary | ICD-10-CM | POA: Diagnosis not present

## 2021-11-02 DIAGNOSIS — R1312 Dysphagia, oropharyngeal phase: Secondary | ICD-10-CM | POA: Diagnosis not present

## 2021-11-03 DIAGNOSIS — R262 Difficulty in walking, not elsewhere classified: Secondary | ICD-10-CM | POA: Diagnosis not present

## 2021-11-03 DIAGNOSIS — M6259 Muscle wasting and atrophy, not elsewhere classified, multiple sites: Secondary | ICD-10-CM | POA: Diagnosis not present

## 2021-11-03 DIAGNOSIS — L24A9 Irritant contact dermatitis due friction or contact with other specified body fluids: Secondary | ICD-10-CM | POA: Diagnosis not present

## 2021-11-03 DIAGNOSIS — N10 Acute tubulo-interstitial nephritis: Secondary | ICD-10-CM | POA: Diagnosis not present

## 2021-11-03 DIAGNOSIS — R1312 Dysphagia, oropharyngeal phase: Secondary | ICD-10-CM | POA: Diagnosis not present

## 2021-11-03 DIAGNOSIS — R600 Localized edema: Secondary | ICD-10-CM | POA: Diagnosis not present

## 2021-11-04 DIAGNOSIS — N136 Pyonephrosis: Secondary | ICD-10-CM | POA: Diagnosis not present

## 2021-11-04 DIAGNOSIS — Z8673 Personal history of transient ischemic attack (TIA), and cerebral infarction without residual deficits: Secondary | ICD-10-CM | POA: Diagnosis not present

## 2021-11-04 DIAGNOSIS — N10 Acute tubulo-interstitial nephritis: Secondary | ICD-10-CM | POA: Diagnosis not present

## 2021-11-04 DIAGNOSIS — R262 Difficulty in walking, not elsewhere classified: Secondary | ICD-10-CM | POA: Diagnosis not present

## 2021-11-04 DIAGNOSIS — M6259 Muscle wasting and atrophy, not elsewhere classified, multiple sites: Secondary | ICD-10-CM | POA: Diagnosis not present

## 2021-11-04 DIAGNOSIS — E119 Type 2 diabetes mellitus without complications: Secondary | ICD-10-CM | POA: Diagnosis not present

## 2021-11-04 DIAGNOSIS — R1312 Dysphagia, oropharyngeal phase: Secondary | ICD-10-CM | POA: Diagnosis not present

## 2021-11-04 DIAGNOSIS — N390 Urinary tract infection, site not specified: Secondary | ICD-10-CM | POA: Diagnosis not present

## 2021-11-04 DIAGNOSIS — M6281 Muscle weakness (generalized): Secondary | ICD-10-CM | POA: Diagnosis not present

## 2021-11-04 DIAGNOSIS — N179 Acute kidney failure, unspecified: Secondary | ICD-10-CM | POA: Diagnosis not present

## 2021-11-04 DIAGNOSIS — R5381 Other malaise: Secondary | ICD-10-CM | POA: Diagnosis not present

## 2021-11-04 DIAGNOSIS — K219 Gastro-esophageal reflux disease without esophagitis: Secondary | ICD-10-CM | POA: Diagnosis not present

## 2021-11-04 DIAGNOSIS — I4891 Unspecified atrial fibrillation: Secondary | ICD-10-CM | POA: Diagnosis not present

## 2021-11-05 DIAGNOSIS — N10 Acute tubulo-interstitial nephritis: Secondary | ICD-10-CM | POA: Diagnosis not present

## 2021-11-05 DIAGNOSIS — M6259 Muscle wasting and atrophy, not elsewhere classified, multiple sites: Secondary | ICD-10-CM | POA: Diagnosis not present

## 2021-11-05 DIAGNOSIS — R262 Difficulty in walking, not elsewhere classified: Secondary | ICD-10-CM | POA: Diagnosis not present

## 2021-11-05 DIAGNOSIS — L98411 Non-pressure chronic ulcer of buttock limited to breakdown of skin: Secondary | ICD-10-CM | POA: Diagnosis not present

## 2021-11-05 DIAGNOSIS — E876 Hypokalemia: Secondary | ICD-10-CM | POA: Diagnosis not present

## 2021-11-05 DIAGNOSIS — I509 Heart failure, unspecified: Secondary | ICD-10-CM | POA: Diagnosis not present

## 2021-11-05 DIAGNOSIS — R1312 Dysphagia, oropharyngeal phase: Secondary | ICD-10-CM | POA: Diagnosis not present

## 2021-11-09 DIAGNOSIS — R339 Retention of urine, unspecified: Secondary | ICD-10-CM | POA: Diagnosis not present

## 2021-11-09 DIAGNOSIS — N10 Acute tubulo-interstitial nephritis: Secondary | ICD-10-CM | POA: Diagnosis not present

## 2021-11-09 DIAGNOSIS — R600 Localized edema: Secondary | ICD-10-CM | POA: Diagnosis not present

## 2021-11-09 DIAGNOSIS — R262 Difficulty in walking, not elsewhere classified: Secondary | ICD-10-CM | POA: Diagnosis not present

## 2021-11-09 DIAGNOSIS — R1312 Dysphagia, oropharyngeal phase: Secondary | ICD-10-CM | POA: Diagnosis not present

## 2021-11-09 DIAGNOSIS — E8770 Fluid overload, unspecified: Secondary | ICD-10-CM | POA: Diagnosis not present

## 2021-11-09 DIAGNOSIS — M6259 Muscle wasting and atrophy, not elsewhere classified, multiple sites: Secondary | ICD-10-CM | POA: Diagnosis not present

## 2021-11-10 DIAGNOSIS — R1030 Lower abdominal pain, unspecified: Secondary | ICD-10-CM | POA: Diagnosis not present

## 2021-11-10 DIAGNOSIS — R262 Difficulty in walking, not elsewhere classified: Secondary | ICD-10-CM | POA: Diagnosis not present

## 2021-11-10 DIAGNOSIS — R300 Dysuria: Secondary | ICD-10-CM | POA: Diagnosis not present

## 2021-11-10 DIAGNOSIS — R1312 Dysphagia, oropharyngeal phase: Secondary | ICD-10-CM | POA: Diagnosis not present

## 2021-11-10 DIAGNOSIS — L24A9 Irritant contact dermatitis due friction or contact with other specified body fluids: Secondary | ICD-10-CM | POA: Diagnosis not present

## 2021-11-10 DIAGNOSIS — N10 Acute tubulo-interstitial nephritis: Secondary | ICD-10-CM | POA: Diagnosis not present

## 2021-11-10 DIAGNOSIS — M6259 Muscle wasting and atrophy, not elsewhere classified, multiple sites: Secondary | ICD-10-CM | POA: Diagnosis not present

## 2021-11-12 DIAGNOSIS — R1312 Dysphagia, oropharyngeal phase: Secondary | ICD-10-CM | POA: Diagnosis not present

## 2021-11-12 DIAGNOSIS — N136 Pyonephrosis: Secondary | ICD-10-CM | POA: Diagnosis not present

## 2021-11-12 DIAGNOSIS — M6281 Muscle weakness (generalized): Secondary | ICD-10-CM | POA: Diagnosis not present

## 2021-11-12 DIAGNOSIS — M6259 Muscle wasting and atrophy, not elsewhere classified, multiple sites: Secondary | ICD-10-CM | POA: Diagnosis not present

## 2021-11-12 DIAGNOSIS — N10 Acute tubulo-interstitial nephritis: Secondary | ICD-10-CM | POA: Diagnosis not present

## 2021-11-12 DIAGNOSIS — K219 Gastro-esophageal reflux disease without esophagitis: Secondary | ICD-10-CM | POA: Diagnosis not present

## 2021-11-12 DIAGNOSIS — I4891 Unspecified atrial fibrillation: Secondary | ICD-10-CM | POA: Diagnosis not present

## 2021-11-12 DIAGNOSIS — E119 Type 2 diabetes mellitus without complications: Secondary | ICD-10-CM | POA: Diagnosis not present

## 2021-11-12 DIAGNOSIS — N179 Acute kidney failure, unspecified: Secondary | ICD-10-CM | POA: Diagnosis not present

## 2021-11-12 DIAGNOSIS — Z8673 Personal history of transient ischemic attack (TIA), and cerebral infarction without residual deficits: Secondary | ICD-10-CM | POA: Diagnosis not present

## 2021-11-12 DIAGNOSIS — R5381 Other malaise: Secondary | ICD-10-CM | POA: Diagnosis not present

## 2021-11-12 DIAGNOSIS — N390 Urinary tract infection, site not specified: Secondary | ICD-10-CM | POA: Diagnosis not present

## 2021-11-12 DIAGNOSIS — R262 Difficulty in walking, not elsewhere classified: Secondary | ICD-10-CM | POA: Diagnosis not present

## 2021-11-14 DIAGNOSIS — N10 Acute tubulo-interstitial nephritis: Secondary | ICD-10-CM | POA: Diagnosis not present

## 2021-11-14 DIAGNOSIS — M6259 Muscle wasting and atrophy, not elsewhere classified, multiple sites: Secondary | ICD-10-CM | POA: Diagnosis not present

## 2021-11-14 DIAGNOSIS — R1312 Dysphagia, oropharyngeal phase: Secondary | ICD-10-CM | POA: Diagnosis not present

## 2021-11-14 DIAGNOSIS — R262 Difficulty in walking, not elsewhere classified: Secondary | ICD-10-CM | POA: Diagnosis not present

## 2021-11-15 DIAGNOSIS — E119 Type 2 diabetes mellitus without complications: Secondary | ICD-10-CM | POA: Diagnosis not present

## 2021-11-15 DIAGNOSIS — I10 Essential (primary) hypertension: Secondary | ICD-10-CM | POA: Diagnosis not present

## 2021-11-15 DIAGNOSIS — R0602 Shortness of breath: Secondary | ICD-10-CM | POA: Diagnosis not present

## 2021-11-15 DIAGNOSIS — I959 Hypotension, unspecified: Secondary | ICD-10-CM | POA: Diagnosis not present

## 2021-11-15 DIAGNOSIS — I4891 Unspecified atrial fibrillation: Secondary | ICD-10-CM | POA: Diagnosis not present

## 2021-11-15 DIAGNOSIS — I5033 Acute on chronic diastolic (congestive) heart failure: Secondary | ICD-10-CM | POA: Diagnosis not present

## 2021-11-15 DIAGNOSIS — E872 Acidosis, unspecified: Secondary | ICD-10-CM | POA: Diagnosis not present

## 2021-11-15 DIAGNOSIS — J810 Acute pulmonary edema: Secondary | ICD-10-CM | POA: Diagnosis not present

## 2021-11-15 DIAGNOSIS — Z66 Do not resuscitate: Secondary | ICD-10-CM | POA: Diagnosis not present

## 2021-11-15 DIAGNOSIS — R410 Disorientation, unspecified: Secondary | ICD-10-CM | POA: Diagnosis not present

## 2021-11-15 DIAGNOSIS — I509 Heart failure, unspecified: Secondary | ICD-10-CM | POA: Diagnosis not present

## 2021-11-15 DIAGNOSIS — Z8616 Personal history of COVID-19: Secondary | ICD-10-CM | POA: Diagnosis not present

## 2021-11-15 DIAGNOSIS — I517 Cardiomegaly: Secondary | ICD-10-CM | POA: Diagnosis not present

## 2021-11-15 DIAGNOSIS — I421 Obstructive hypertrophic cardiomyopathy: Secondary | ICD-10-CM | POA: Diagnosis not present

## 2021-11-15 DIAGNOSIS — J90 Pleural effusion, not elsewhere classified: Secondary | ICD-10-CM | POA: Diagnosis not present

## 2021-11-15 DIAGNOSIS — R918 Other nonspecific abnormal finding of lung field: Secondary | ICD-10-CM | POA: Diagnosis not present

## 2021-11-15 DIAGNOSIS — I110 Hypertensive heart disease with heart failure: Secondary | ICD-10-CM | POA: Diagnosis not present

## 2021-11-15 DIAGNOSIS — R6521 Severe sepsis with septic shock: Secondary | ICD-10-CM | POA: Diagnosis not present

## 2021-11-15 DIAGNOSIS — D649 Anemia, unspecified: Secondary | ICD-10-CM | POA: Diagnosis not present

## 2021-11-15 DIAGNOSIS — Z515 Encounter for palliative care: Secondary | ICD-10-CM | POA: Diagnosis not present

## 2021-11-15 DIAGNOSIS — Z9981 Dependence on supplemental oxygen: Secondary | ICD-10-CM | POA: Diagnosis not present

## 2021-11-15 DIAGNOSIS — A419 Sepsis, unspecified organism: Secondary | ICD-10-CM | POA: Diagnosis not present

## 2021-11-15 DIAGNOSIS — J9811 Atelectasis: Secondary | ICD-10-CM | POA: Diagnosis not present

## 2021-11-15 DIAGNOSIS — J811 Chronic pulmonary edema: Secondary | ICD-10-CM | POA: Diagnosis not present

## 2021-11-15 DIAGNOSIS — J9601 Acute respiratory failure with hypoxia: Secondary | ICD-10-CM | POA: Diagnosis not present

## 2021-11-16 DIAGNOSIS — D649 Anemia, unspecified: Secondary | ICD-10-CM | POA: Diagnosis not present

## 2021-11-23 DEATH — deceased
